# Patient Record
Sex: Female | Born: 1950 | Race: White | Hispanic: No | Marital: Married | Smoking: Never smoker
Health system: Southern US, Community
[De-identification: ages and names within clinical notes are randomized; demographics above are authoritative.]

## PROBLEM LIST (undated history)

## (undated) DIAGNOSIS — K219 Gastro-esophageal reflux disease without esophagitis: Secondary | ICD-10-CM

## (undated) DIAGNOSIS — Z8774 Personal history of (corrected) congenital malformations of heart and circulatory system: Secondary | ICD-10-CM

## (undated) DIAGNOSIS — T7840XA Allergy, unspecified, initial encounter: Secondary | ICD-10-CM

## (undated) DIAGNOSIS — I5189 Other ill-defined heart diseases: Secondary | ICD-10-CM

## (undated) DIAGNOSIS — F32A Depression, unspecified: Secondary | ICD-10-CM

## (undated) DIAGNOSIS — I10 Essential (primary) hypertension: Secondary | ICD-10-CM

## (undated) DIAGNOSIS — G459 Transient cerebral ischemic attack, unspecified: Secondary | ICD-10-CM

## (undated) DIAGNOSIS — K760 Fatty (change of) liver, not elsewhere classified: Secondary | ICD-10-CM

## (undated) DIAGNOSIS — G43909 Migraine, unspecified, not intractable, without status migrainosus: Secondary | ICD-10-CM

## (undated) DIAGNOSIS — I700 Atherosclerosis of aorta: Secondary | ICD-10-CM

## (undated) DIAGNOSIS — Z860100 Personal history of colon polyps, unspecified: Secondary | ICD-10-CM

## (undated) DIAGNOSIS — F329 Major depressive disorder, single episode, unspecified: Secondary | ICD-10-CM

## (undated) DIAGNOSIS — Z8619 Personal history of other infectious and parasitic diseases: Secondary | ICD-10-CM

## (undated) DIAGNOSIS — I639 Cerebral infarction, unspecified: Secondary | ICD-10-CM

## (undated) HISTORY — DX: Fatty (change of) liver, not elsewhere classified: K76.0

## (undated) HISTORY — DX: Migraine, unspecified, not intractable, without status migrainosus: G43.909

## (undated) HISTORY — DX: Depression, unspecified: F32.A

## (undated) HISTORY — DX: Personal history of colon polyps, unspecified: Z86.0100

## (undated) HISTORY — DX: Atherosclerosis of aorta: I70.0

## (undated) HISTORY — DX: Personal history of other infectious and parasitic diseases: Z86.19

## (undated) HISTORY — DX: Personal history of colonic polyps: Z86.010

## (undated) HISTORY — DX: Personal history of (corrected) congenital malformations of heart and circulatory system: Z87.74

## (undated) HISTORY — DX: Other ill-defined heart diseases: I51.89

## (undated) HISTORY — DX: Allergy, unspecified, initial encounter: T78.40XA

## (undated) HISTORY — DX: Major depressive disorder, single episode, unspecified: F32.9

## (undated) HISTORY — DX: Gastro-esophageal reflux disease without esophagitis: K21.9

## (undated) HISTORY — DX: Cerebral infarction, unspecified: I63.9

---

## 2003-04-05 ENCOUNTER — Ambulatory Visit (HOSPITAL_COMMUNITY): Admission: RE | Admit: 2003-04-05 | Discharge: 2003-04-05 | Payer: Self-pay | Admitting: Internal Medicine

## 2003-04-29 ENCOUNTER — Encounter: Payer: Self-pay | Admitting: Gastroenterology

## 2003-04-29 ENCOUNTER — Encounter: Admission: RE | Admit: 2003-04-29 | Discharge: 2003-04-29 | Payer: Self-pay | Admitting: Gastroenterology

## 2004-01-09 ENCOUNTER — Ambulatory Visit (HOSPITAL_COMMUNITY): Admission: RE | Admit: 2004-01-09 | Discharge: 2004-01-09 | Payer: Self-pay | Admitting: Gastroenterology

## 2004-02-17 ENCOUNTER — Ambulatory Visit (HOSPITAL_COMMUNITY): Admission: RE | Admit: 2004-02-17 | Discharge: 2004-02-17 | Payer: Self-pay | Admitting: Internal Medicine

## 2005-06-19 ENCOUNTER — Ambulatory Visit: Payer: Self-pay | Admitting: Internal Medicine

## 2005-07-17 ENCOUNTER — Ambulatory Visit: Payer: Self-pay | Admitting: Internal Medicine

## 2005-08-14 ENCOUNTER — Ambulatory Visit: Payer: Self-pay | Admitting: Internal Medicine

## 2005-08-28 ENCOUNTER — Ambulatory Visit: Payer: Self-pay | Admitting: Internal Medicine

## 2005-09-30 ENCOUNTER — Ambulatory Visit: Payer: Self-pay | Admitting: Internal Medicine

## 2005-12-05 ENCOUNTER — Ambulatory Visit: Payer: Self-pay | Admitting: Internal Medicine

## 2005-12-18 ENCOUNTER — Ambulatory Visit: Payer: Self-pay | Admitting: Internal Medicine

## 2006-01-17 ENCOUNTER — Ambulatory Visit: Payer: Self-pay | Admitting: Internal Medicine

## 2006-01-20 ENCOUNTER — Ambulatory Visit: Payer: Self-pay | Admitting: Internal Medicine

## 2006-02-14 ENCOUNTER — Ambulatory Visit: Payer: Self-pay | Admitting: Internal Medicine

## 2006-02-18 ENCOUNTER — Ambulatory Visit: Payer: Self-pay | Admitting: Internal Medicine

## 2006-02-21 ENCOUNTER — Ambulatory Visit: Payer: Self-pay | Admitting: Internal Medicine

## 2006-02-25 ENCOUNTER — Ambulatory Visit: Payer: Self-pay | Admitting: Internal Medicine

## 2006-02-28 ENCOUNTER — Ambulatory Visit: Payer: Self-pay | Admitting: Internal Medicine

## 2006-03-04 ENCOUNTER — Ambulatory Visit: Payer: Self-pay | Admitting: Internal Medicine

## 2006-03-07 ENCOUNTER — Ambulatory Visit: Payer: Self-pay | Admitting: Internal Medicine

## 2006-03-10 ENCOUNTER — Ambulatory Visit: Payer: Self-pay | Admitting: Internal Medicine

## 2006-03-11 ENCOUNTER — Ambulatory Visit: Payer: Self-pay | Admitting: Internal Medicine

## 2006-03-21 ENCOUNTER — Ambulatory Visit: Payer: Self-pay | Admitting: Internal Medicine

## 2006-03-25 ENCOUNTER — Ambulatory Visit: Payer: Self-pay | Admitting: Internal Medicine

## 2006-03-28 ENCOUNTER — Ambulatory Visit: Payer: Self-pay | Admitting: Internal Medicine

## 2006-04-01 ENCOUNTER — Ambulatory Visit: Payer: Self-pay | Admitting: Internal Medicine

## 2006-04-08 ENCOUNTER — Ambulatory Visit: Payer: Self-pay | Admitting: Internal Medicine

## 2006-04-11 ENCOUNTER — Ambulatory Visit: Payer: Self-pay | Admitting: Internal Medicine

## 2006-04-14 ENCOUNTER — Ambulatory Visit: Payer: Self-pay | Admitting: Internal Medicine

## 2006-04-16 ENCOUNTER — Ambulatory Visit: Payer: Self-pay | Admitting: Internal Medicine

## 2006-04-18 ENCOUNTER — Ambulatory Visit: Payer: Self-pay | Admitting: Internal Medicine

## 2006-04-22 ENCOUNTER — Ambulatory Visit: Payer: Self-pay | Admitting: Internal Medicine

## 2006-04-25 ENCOUNTER — Ambulatory Visit: Payer: Self-pay | Admitting: Internal Medicine

## 2006-04-29 ENCOUNTER — Ambulatory Visit: Payer: Self-pay | Admitting: Internal Medicine

## 2006-05-02 ENCOUNTER — Ambulatory Visit: Payer: Self-pay | Admitting: Internal Medicine

## 2006-05-06 ENCOUNTER — Ambulatory Visit: Payer: Self-pay | Admitting: Internal Medicine

## 2006-05-09 ENCOUNTER — Ambulatory Visit: Payer: Self-pay | Admitting: Internal Medicine

## 2006-05-13 ENCOUNTER — Ambulatory Visit: Payer: Self-pay | Admitting: Internal Medicine

## 2006-05-16 ENCOUNTER — Ambulatory Visit: Payer: Self-pay | Admitting: Internal Medicine

## 2006-05-19 ENCOUNTER — Ambulatory Visit: Payer: Self-pay | Admitting: Internal Medicine

## 2006-05-20 ENCOUNTER — Ambulatory Visit: Payer: Self-pay | Admitting: Internal Medicine

## 2006-05-23 ENCOUNTER — Ambulatory Visit: Payer: Self-pay | Admitting: Internal Medicine

## 2006-06-03 ENCOUNTER — Ambulatory Visit: Payer: Self-pay | Admitting: Internal Medicine

## 2006-06-06 ENCOUNTER — Ambulatory Visit: Payer: Self-pay | Admitting: Internal Medicine

## 2006-06-10 ENCOUNTER — Ambulatory Visit: Payer: Self-pay | Admitting: Internal Medicine

## 2006-06-17 ENCOUNTER — Ambulatory Visit: Payer: Self-pay | Admitting: Internal Medicine

## 2006-06-27 ENCOUNTER — Ambulatory Visit: Payer: Self-pay | Admitting: Internal Medicine

## 2006-07-01 ENCOUNTER — Ambulatory Visit: Payer: Self-pay | Admitting: Internal Medicine

## 2006-07-09 ENCOUNTER — Ambulatory Visit: Payer: Self-pay | Admitting: Internal Medicine

## 2006-07-15 ENCOUNTER — Ambulatory Visit: Payer: Self-pay | Admitting: Internal Medicine

## 2006-07-25 ENCOUNTER — Ambulatory Visit: Payer: Self-pay | Admitting: Internal Medicine

## 2006-08-08 ENCOUNTER — Ambulatory Visit: Payer: Self-pay | Admitting: Internal Medicine

## 2006-08-22 ENCOUNTER — Ambulatory Visit: Payer: Self-pay | Admitting: Internal Medicine

## 2006-08-29 ENCOUNTER — Ambulatory Visit: Payer: Self-pay | Admitting: Internal Medicine

## 2006-09-01 ENCOUNTER — Ambulatory Visit: Payer: Self-pay | Admitting: Internal Medicine

## 2006-09-12 ENCOUNTER — Ambulatory Visit: Payer: Self-pay | Admitting: Internal Medicine

## 2006-09-19 ENCOUNTER — Ambulatory Visit: Payer: Self-pay | Admitting: Internal Medicine

## 2006-10-03 ENCOUNTER — Ambulatory Visit: Payer: Self-pay | Admitting: Internal Medicine

## 2006-10-09 ENCOUNTER — Ambulatory Visit: Payer: Self-pay | Admitting: Internal Medicine

## 2006-10-14 ENCOUNTER — Ambulatory Visit: Payer: Self-pay | Admitting: Internal Medicine

## 2006-10-17 ENCOUNTER — Ambulatory Visit: Payer: Self-pay | Admitting: Internal Medicine

## 2006-10-24 ENCOUNTER — Ambulatory Visit: Payer: Self-pay | Admitting: Internal Medicine

## 2006-10-31 ENCOUNTER — Ambulatory Visit: Payer: Self-pay | Admitting: Internal Medicine

## 2006-11-06 ENCOUNTER — Ambulatory Visit: Payer: Self-pay | Admitting: Internal Medicine

## 2006-11-11 ENCOUNTER — Ambulatory Visit: Payer: Self-pay | Admitting: Internal Medicine

## 2006-11-21 ENCOUNTER — Ambulatory Visit: Payer: Self-pay | Admitting: Internal Medicine

## 2006-12-05 ENCOUNTER — Ambulatory Visit: Payer: Self-pay | Admitting: Internal Medicine

## 2006-12-12 ENCOUNTER — Ambulatory Visit: Payer: Self-pay | Admitting: Internal Medicine

## 2006-12-19 ENCOUNTER — Ambulatory Visit: Payer: Self-pay | Admitting: Internal Medicine

## 2007-01-02 ENCOUNTER — Ambulatory Visit: Payer: Self-pay | Admitting: Internal Medicine

## 2007-01-14 ENCOUNTER — Ambulatory Visit: Payer: Self-pay | Admitting: Internal Medicine

## 2007-01-20 ENCOUNTER — Ambulatory Visit: Payer: Self-pay | Admitting: Internal Medicine

## 2007-02-04 ENCOUNTER — Ambulatory Visit: Payer: Self-pay | Admitting: Internal Medicine

## 2007-02-10 ENCOUNTER — Ambulatory Visit: Payer: Self-pay | Admitting: Internal Medicine

## 2007-02-25 ENCOUNTER — Ambulatory Visit: Payer: Self-pay | Admitting: Internal Medicine

## 2007-03-06 ENCOUNTER — Ambulatory Visit: Payer: Self-pay | Admitting: Internal Medicine

## 2007-03-10 ENCOUNTER — Ambulatory Visit: Payer: Self-pay | Admitting: Internal Medicine

## 2007-03-16 ENCOUNTER — Ambulatory Visit: Payer: Self-pay | Admitting: Internal Medicine

## 2007-03-31 ENCOUNTER — Ambulatory Visit: Payer: Self-pay | Admitting: Internal Medicine

## 2007-04-01 ENCOUNTER — Ambulatory Visit: Payer: Self-pay | Admitting: Internal Medicine

## 2007-04-10 ENCOUNTER — Ambulatory Visit: Payer: Self-pay | Admitting: Internal Medicine

## 2007-04-17 ENCOUNTER — Ambulatory Visit: Payer: Self-pay | Admitting: Internal Medicine

## 2007-05-01 ENCOUNTER — Ambulatory Visit: Payer: Self-pay | Admitting: Internal Medicine

## 2007-05-14 ENCOUNTER — Ambulatory Visit: Payer: Self-pay | Admitting: Internal Medicine

## 2007-05-22 ENCOUNTER — Ambulatory Visit: Payer: Self-pay | Admitting: Internal Medicine

## 2007-05-27 ENCOUNTER — Ambulatory Visit: Payer: Self-pay | Admitting: Internal Medicine

## 2007-06-12 ENCOUNTER — Ambulatory Visit: Payer: Self-pay | Admitting: Internal Medicine

## 2007-06-26 ENCOUNTER — Ambulatory Visit: Payer: Self-pay | Admitting: Internal Medicine

## 2007-07-03 ENCOUNTER — Ambulatory Visit: Payer: Self-pay | Admitting: Internal Medicine

## 2007-07-14 ENCOUNTER — Ambulatory Visit: Payer: Self-pay | Admitting: Internal Medicine

## 2007-07-24 ENCOUNTER — Ambulatory Visit: Payer: Self-pay | Admitting: Internal Medicine

## 2007-07-28 ENCOUNTER — Ambulatory Visit: Payer: Self-pay | Admitting: Internal Medicine

## 2007-08-21 ENCOUNTER — Ambulatory Visit: Payer: Self-pay | Admitting: Internal Medicine

## 2007-08-27 ENCOUNTER — Ambulatory Visit: Payer: Self-pay | Admitting: Internal Medicine

## 2007-09-11 ENCOUNTER — Ambulatory Visit: Payer: Self-pay | Admitting: Internal Medicine

## 2007-09-18 ENCOUNTER — Ambulatory Visit: Payer: Self-pay | Admitting: Internal Medicine

## 2007-09-21 DIAGNOSIS — J309 Allergic rhinitis, unspecified: Secondary | ICD-10-CM | POA: Insufficient documentation

## 2007-09-21 DIAGNOSIS — R05 Cough: Secondary | ICD-10-CM

## 2007-09-21 DIAGNOSIS — K219 Gastro-esophageal reflux disease without esophagitis: Secondary | ICD-10-CM | POA: Insufficient documentation

## 2007-09-21 DIAGNOSIS — R059 Cough, unspecified: Secondary | ICD-10-CM | POA: Insufficient documentation

## 2007-09-24 ENCOUNTER — Ambulatory Visit: Payer: Self-pay | Admitting: Internal Medicine

## 2007-09-30 ENCOUNTER — Ambulatory Visit: Payer: Self-pay | Admitting: Internal Medicine

## 2007-10-02 ENCOUNTER — Ambulatory Visit: Payer: Self-pay | Admitting: Internal Medicine

## 2007-10-09 ENCOUNTER — Ambulatory Visit: Payer: Self-pay | Admitting: Internal Medicine

## 2007-10-16 ENCOUNTER — Ambulatory Visit: Payer: Self-pay | Admitting: Internal Medicine

## 2007-10-23 ENCOUNTER — Ambulatory Visit: Payer: Self-pay | Admitting: Internal Medicine

## 2007-10-30 ENCOUNTER — Ambulatory Visit: Payer: Self-pay | Admitting: Internal Medicine

## 2007-11-06 ENCOUNTER — Ambulatory Visit: Payer: Self-pay | Admitting: Internal Medicine

## 2007-11-20 ENCOUNTER — Ambulatory Visit: Payer: Self-pay | Admitting: Internal Medicine

## 2007-12-04 ENCOUNTER — Ambulatory Visit: Payer: Self-pay | Admitting: Internal Medicine

## 2007-12-10 ENCOUNTER — Ambulatory Visit: Payer: Self-pay | Admitting: Internal Medicine

## 2010-09-18 NOTE — Miscellaneous (Signed)
Summary: Injection Record / [facility]    Injection Record / [facility]    Imported By: Lennie Odor 01/09/2010 15:52:09  _____________________________________________________________________  External Attachment:    Type:   Image     Comment:   External Document

## 2011-01-01 NOTE — Assessment & Plan Note (Signed)
[facility]                             PULMONARY OFFICE NOTE   Yolanda Gibbs, Yolanda Gibbs                       MRN:          [id number]  DATE:05/14/2007                            DOB:          [birth year]    PROBLEM LIST:  1. Allergic rhinitis.  2. Cough.  3. Esophageal reflux.   HISTORY:  Still notices postnasal drip and cough.  This week has had a  sense of bilateral maxillary pressure but not blowing anything out, some  postnasal drip sensation still in her throat and occasionally will bring  up some scant clear mucus.  She is not actively aware of heartburn.  She  brings a chest x-ray from December 17, 2006, done at [place].  On  initial review on my computer screen, I do not see active disease.   MEDICATIONS:  She continues allergy vaccine at 1:10 but has stopped  everything else.   DRUG INTOLERANT:  DARVON.   OBJECTIVE:  VITAL SIGNS:  Weight 150 pounds, BP 124/68, pulse 97, room  air saturation 98%.  HEENT:  There is periorbital edema.  Conjunctivae are not injected.  The  pharynx was not red.  I saw no evidence of postnasal drip.  RESPIRATORY:  She started to cough and was putting a peppermint in her  mouth.  I stopped her and pointed out that peppermint relaxes the lower  esophageal sphincter and tends to aggravate reflux which is a potential  part of her coughing.  I heard no wheeze or rhonchi.   IMPRESSION:  Multifactorial cough with components of allergic rhinitis  and probably reflux.   PLAN:  Continue vaccine, use over-the-counter Claritin, and either  Pepcid or Prilosec supplemented with Tums, may use decongestant Sudafed  PE p.r.n.  Saline lavage.  Schedule return 6 months, earlier p.r.n.  failure to clear.     Clinton D. Maple Hudson, MD, [facility], FACP  Electronically Signed    CDY/MedQ  DD: 05/17/2007  DT: 05/17/2007  Job #: [id number]   cc:   [facility]

## 2011-01-04 NOTE — Assessment & Plan Note (Signed)
[facility]                               PULMONARY OFFICE NOTE   Yolanda Gibbs, Yolanda Gibbs                       MRN:          [id number]  DATE:04/14/2006                            DOB:          12/31/1950    PULMONARY ALLERGY FOLLOWUP:   PROBLEM:  1. Allergic rhinitis.  2. Cough.   HISTORY:  She has begun allergy vaccine getting injections here and building  with no problems.  Astelin made her too sleepy the next day.  She has noted  occasional reflux since she has been coached to watch for it.   MEDICATIONS:  1. Effexor 75 mg x3.  2. Aspirin.  3. Estradiol.  4. Prometrium.  5. Allergy vaccine still in buildup phase.   ALLERGIES:  Drug intolerance to DARVON.   OBJECTIVE:  VITAL SIGNS:  Weight 152 pounds.  Blood pressure 122/68. Pulse  regular 75.  Room air saturation 97%.  HEENT:  Speech quality seems mildly nasal and there is a little periorbital  puffiness.  I see no post nasal drainage or polyps and her pharynx is clear.  There is no adenopathy.  CHEST:  Clear with good air-flow, no cough or wheeze.  HEART:  Heart sounds are regular without murmur.   IMPRESSION:  1. Allergic rhinitis still with room to improve.  Allergy vaccine buildup      is progressing well.  2. Suspect at least intermittent reflux as a partial component of her      cough.   PLAN:  1. I suggested she resume and maintain daily Prilosec over-the-counter.  2. Sample Singulair for trial 10 mg.  3. Sample Nasonex two sprays each nostril daily.  4. Claritin over-the-counter p.r.n.  5. Continue allergy vaccine buildup.  6. Schedule return to me in three months, earlier as needed.                                   Clinton D. Maple Hudson, MD, [facility], FACP   CDY/MedQ  DD:  04/16/2006  DT:  04/17/2006  Job #:  [id number]   cc:   Maryelizabeth Rowan, MD

## 2011-01-04 NOTE — Op Note (Signed)
NAMEMARNI, Yolanda Gibbs                          ACCOUNT NO.:  [id number]   MEDICAL RECORD NO.:  [id number]                   PATIENT TYPE:  AMB   LOCATION:  ENDO                                 FACILITY:  [facility]   PHYSICIAN:  Anselmo Rod, M.D.               DATE OF BIRTH:  1951/02/25   DATE OF PROCEDURE:  01/09/2004  DATE OF DISCHARGE:                                 OPERATIVE REPORT   PROCEDURE:  Screening colonoscopy.   ENDOSCOPIST:  Charna Elizabeth, M.D.   INSTRUMENT USED:  Olympus video colonoscope.   INDICATIONS FOR PROCEDURE:  60 year old white female undergoing screening  colonoscopy to rule out colonic polyps, masses, etc.   PREPROCEDURE PREPARATION:  Informed consent was procured from the patient.  The patient was fasted for eight hours prior to the procedure and prepped  with a bottle of magnesium citrate and a gallon of GoLYTELY the night prior  to the procedure.   PREPROCEDURE PHYSICAL:  Patient with stable vital signs.  Neck supple.  Chest clear to auscultation.  S1 and S2 regular.  Abdomen soft with normal  bowel sounds.   DESCRIPTION OF PROCEDURE:  The patient was placed in the left lateral  decubitus position, sedated with 80 mg of Demerol and 8 mg Versed in slow  incremental doses.  Once the patient was adequately sedated, maintained on  low flow oxygen and continuous cardiac monitoring, the Olympus video  colonoscope was advanced into the rectum to the cecum.  The appendiceal  orifice and ileocecal valve were clearly visualized and photographed.  The  patient's position had to be changed from the left lateral to the supine  position and gentle application of abdominal pressure in the left lower  quadrant to reach the cecal base.  No masses, polyps, erosions, ulcerations,  or diverticula were seen.  Retroflexion of the rectum revealed no  abnormalities.  The patient tolerated the procedure well without  complications.   IMPRESSION:  Normal colonoscopy to the  cecum, no masses, polyps, or  diverticulum seen.   RECOMMENDATIONS:  1. Continue a high fiber diet with liberal fluid intake.  2. Repeat CRC screening is in the next ten years unless the patient develops     any abnormal symptoms in the interim.  3. Outpatient follow up as need arises in the future.                                               Anselmo Rod, M.D.    JNM/MEDQ  D:  01/09/2004  T:  01/09/2004  Job:  [id number]   cc:   Teena Irani. Arlyce Dice, M.D.  [address]  [place]  [place] [address]  Fax: [phone]

## 2011-01-04 NOTE — Assessment & Plan Note (Signed)
[facility]                             PULMONARY OFFICE NOTE   LILLEIGH, HECHAVARRIA                       MRN:          [id number]  DATE:11/11/2006                            DOB:          12-02-1950    PULMONARY OFFICE FOLLOWUP   PROBLEMS:  1. Allergic rhinitis.  2. Cough.   HISTORY:  Recent sore throat with increased nasal mucus, scant sputum  coughed up from chest.  No definite wheeze.  She is using Pepcid p.r.n.  without much heartburn.  No problems with her allergy vaccine, which is  given here at 1:50.   MEDICATIONS:  1. Effexor 75 mg x3.  2. Estradiol.  3. Prometrium.  4. Allergy vaccine.  5. Pepcid.   DRUG INTOLERANCES:  DARVON.   She has come off of Nasacort AQ and Singulair.   OBJECTIVE:  Weight 149 pounds, BP 124/70, pulse regular 85, room air  saturation 98%.  Nasal mucosa looks reddened without excess mucus or polyps.  Pharynx is  clear.  Conjunctivae clear.  LUNGS:  Clear to P and A.  Heart sounds normal.   IMPRESSION:  Rhinitis.  She needs to be paying attention to possible  role of reflux with some of her cough and it may help to be more  consistent with the Pepcid.   PLAN:  We are advancing vaccine to 1:10 and I have again discussed  environmental precautions.  She will try over-the-counter  antihistamines, if those are helpful.  Schedule return in 6 months,  earlier p.r.n.     Clinton D. Maple Hudson, MD, [facility], FACP  Electronically Signed   CDY/MedQ  DD: 11/15/2006  DT: 11/15/2006  Job #: [id number]   cc:   [facility]

## 2011-01-04 NOTE — Assessment & Plan Note (Signed)
Yolanda Gibbs                             PULMONARY OFFICE NOTE   Yolanda Gibbs, Yolanda Gibbs                       MRN:          [id number]  DATE:07/15/2006                            DOB:          [birth year]    PROBLEM:  1. Allergic rhinitis.  2. Cough.   HISTORY:  She returns for allergy followup getting her vaccine at 1:50  here.  She denies problems.  She has noticed increased cough especially  in the morning, producing small amounts of clear sputum and sometimes  she feels a little wheezy.  There is a constant postnasal drainage.  She  does notice reflux and takes Pepcid p.r.n.  She gets a hard feeling in  her throat which is her prompt to take the Pepcid.  She had not noticed  benefit from Nasacort or Singulair.   MEDICATION:  1. Effexor 75 mg.  2. Aspirin.  3. Estradiol.  4. Prometrium  5. Allergy vaccine.  6. Pepcid.   DRUG INTOLERANT OF DARVON.   OBJECTIVE:  Weight 151 pounds.  BP 122/70.  Pulse regular 83.  Room air  saturation 97%.  Question trace wheeze.  Her throat looks normal.  There is some sniffling but no drainage.  No  significant edema in the turbinates.  She has some dry cough.  Heart sounds are regular, normal.   IMPRESSION:  1. Allergic rhinitis.  2. Mild asthma.  3. Reflux.   PLAN:  Reflux precautions were emphasized.  Environmental precautions  were reemphasized.  Try samples of Xyzal 5 mg and then for a cheaper  comparison try generic Allegra 180 mg daily,  as well as antihistamines while continuing her vaccine here.  Schedule  return in 4 months, anticipating we will advance then to 1:10.  Early  p.r.n.     Clinton D. Maple Hudson, MD, [facility], FACP  Electronically Signed    CDY/MedQ  DD: 07/19/2006  DT: 07/21/2006  Job #: [id number]   cc:   Maryelizabeth Rowan, M.D.

## 2014-08-29 ENCOUNTER — Encounter: Payer: Self-pay | Admitting: Family

## 2014-08-29 ENCOUNTER — Ambulatory Visit (INDEPENDENT_AMBULATORY_CARE_PROVIDER_SITE_OTHER): Payer: 59 | Admitting: Family

## 2014-08-29 ENCOUNTER — Other Ambulatory Visit (HOSPITAL_COMMUNITY)
Admission: RE | Admit: 2014-08-29 | Discharge: 2014-08-29 | Disposition: A | Discharge status: Home / Self Care | Payer: 59 | Source: Ambulatory Visit | Attending: Family | Admitting: Family

## 2014-08-29 VITALS — BP 120/86 | HR 81 | Temp 98.4°F | Resp 16 | Ht 65.0 in | Wt 169.0 lb

## 2014-08-29 DIAGNOSIS — F32A Depression, unspecified: Secondary | ICD-10-CM | POA: Insufficient documentation

## 2014-08-29 DIAGNOSIS — N760 Acute vaginitis: Secondary | ICD-10-CM

## 2014-08-29 DIAGNOSIS — R05 Cough: Secondary | ICD-10-CM

## 2014-08-29 DIAGNOSIS — N3001 Acute cystitis with hematuria: Secondary | ICD-10-CM

## 2014-08-29 DIAGNOSIS — F329 Major depressive disorder, single episode, unspecified: Secondary | ICD-10-CM

## 2014-08-29 DIAGNOSIS — N390 Urinary tract infection, site not specified: Secondary | ICD-10-CM | POA: Insufficient documentation

## 2014-08-29 DIAGNOSIS — R059 Cough, unspecified: Secondary | ICD-10-CM

## 2014-08-29 DIAGNOSIS — Z86010 Personal history of colonic polyps: Secondary | ICD-10-CM

## 2014-08-29 LAB — POCT URINALYSIS DIPSTICK
Bilirubin, UA: NEGATIVE
GLUCOSE UA: NEGATIVE
KETONES UA: NEGATIVE
Nitrite, UA: NEGATIVE
SPEC GRAV UA: 1.015
UROBILINOGEN UA: NEGATIVE
pH, UA: 6

## 2014-08-29 MED ORDER — CIPROFLOXACIN HCL 250 MG PO TABS: 250.0000 mg | ORAL_TABLET | Freq: Two times a day (BID) | ORAL | Status: DC

## 2014-08-29 NOTE — Assessment & Plan Note (Signed)
She will be due for follow up colo at age 64.

## 2014-08-29 NOTE — Progress Notes (Signed)
Pre visit review using our clinic review tool, if applicable. No additional management support is needed unless otherwise documented below in the visit note. 

## 2014-08-29 NOTE — Assessment & Plan Note (Signed)
Fair control- long standing problem. She has seen Dr. Fannie Kneelint Young remotely. She has also seen ENT. Notes that symptoms are improved with gabapentin. Continue gabapentin and zantac.

## 2014-08-29 NOTE — Assessment & Plan Note (Signed)
UA + leuks, + blood. I think her symptoms are secondary to UTI. Will send for culture and will rx with cipro.

## 2014-08-29 NOTE — Assessment & Plan Note (Signed)
Wet prep is performed today. Await results.

## 2014-08-29 NOTE — Patient Instructions (Signed)
Please schedule a complete physical at the front desk. We will contact you with the results of your test. Welcome to [facility]!

## 2014-08-29 NOTE — Progress Notes (Signed)
Subjective:    Patient ID: Yolanda Gibbs, female    DOB: [birth year], 64 y.o.   MRN: [id number]  HPI  Yolanda Gibbs is a 64 yr old female who presents today to establish care. [facility].  Her chief complaint today is Vaginal irritation x 3-4 days and vaginal bleeding x 2 days. Tried monistat x 1 day without relief. Reports that she would see blood with urination only. Did have a spot on her panties this AM.  She reports + pain with urination.     Chronic cough- uses zantac for reflux. Reports that she has taken x 2 years.  Reports that she has a "really bad cough" and the allergist placed her on zantac which helped some. She reports gabapentin has helped the most for her cough which was given by her ENT- She sees Dr. Jenne PaneBates but only saw him once.  Depression- denies hx of anxiety but has hx of depression.  Initially started on effexor for menopausal symptoms around age 64. She notes that depression has worsened some. Denies SI/HI.  Reports that she sleeps well. She doesnot wish to adjust meds at this time.    Reports that she recently had shingles outbreak on trunk which has resolved.  Colon polyps- reports that her last colo was 2 years ago and was told that she should repeat at age 64.     Review of Systems  Constitutional: Negative for unexpected weight change.  HENT: Positive for rhinorrhea and tinnitus. Negative for hearing loss.   Eyes: Negative for visual disturbance.  Respiratory: Negative for cough.   Cardiovascular: Negative for leg swelling.  Gastrointestinal: Positive for constipation. Negative for nausea and diarrhea.  Musculoskeletal: Negative for myalgias and arthralgias.  Skin: Negative for rash.  Neurological: Negative for headaches.  Hematological: Negative for adenopathy.   Past Medical History  Diagnosis Date  . History of chicken pox   . Depression   . GERD (gastroesophageal reflux disease)   . Allergy    seasonal  . History of colon polyps     benign    History   Social History  . Marital Status: Married    Spouse Name: N/A    Number of Children: N/A  . Years of Education: N/A   Occupational History  . Not on file.   Social History Main Topics  . Smoking status: Never Smoker   . Smokeless tobacco: Never Used  . Alcohol Use: 1.8 - 2.4 oz/week    3-4 Not specified per week  . Drug Use: Not on file  . Sexual Activity: Not on file   Other Topics Concern  . Not on file   Social History Narrative    History reviewed. No pertinent past surgical history.  Family History  Problem Relation Age of Onset  . Arthritis Mother   . CAD Mother   . AAA (abdominal aortic aneurysm) Mother   . Cancer Mother     lung  . Stroke Father   . Cancer Maternal Aunt     breast  . Arthritis Maternal Grandmother   . Cancer Maternal Grandfather     prostate  . Depression Paternal Grandmother     Allergies  Allergen Reactions  . Propoxyphene Hcl     No current outpatient prescriptions on file prior to visit.   No current facility-administered medications on file prior to visit.    BP 120/86 mmHg  Pulse 81  Temp(Src) 98.4 F (36.9 C) (  Oral)  Resp 16  Ht  (1.651 m)  Wt 169 lb (76.658 kg)  BMI 28.12 kg/m2  SpO2 99%  LMP 08/19/2004       Objective:   Physical Exam  Constitutional: She is oriented to person, place, and time. She appears well-developed and well-nourished. No distress.  HENT:  Head: Normocephalic and atraumatic.  Eyes: No scleral icterus.  Neck: Neck supple. No thyromegaly present.  Cardiovascular: Normal rate and regular rhythm.   No murmur heard. Pulmonary/Chest: Effort normal and breath sounds normal. No respiratory distress. She has no wheezes. She has no rales. She exhibits no tenderness.  Genitourinary: Vagina normal. No erythema in the vagina. No vaginal discharge found.  Musculoskeletal: She exhibits no edema.  Lymphadenopathy:    She has no  cervical adenopathy.  Neurological: She is alert and oriented to person, place, and time.  Skin: Skin is warm and dry. No rash noted.  Psychiatric: She has a normal mood and affect. Her behavior is normal. Judgment and thought content normal.          Assessment & Plan:

## 2014-08-29 NOTE — Addendum Note (Signed)
Addended by: Eustace QuailEABOLD, Arliss Hepburn J on: 08/29/2014 05:21 PM   Modules accepted: Orders

## 2014-08-29 NOTE — Addendum Note (Signed)
Addended by: Mervin KungFERGERSON, Nathaniel Yaden A on: 08/29/2014 04:16 PM   Modules accepted: Orders

## 2014-08-30 LAB — CERVICOVAGINAL ANCILLARY ONLY
WET PREP (BD AFFIRM): NEGATIVE
WET PREP (BD AFFIRM): NEGATIVE
Wet Prep (BD Affirm): NEGATIVE

## 2014-08-31 ENCOUNTER — Telehealth: Payer: Self-pay | Admitting: Family

## 2014-08-31 LAB — URINE CULTURE

## 2014-09-07 NOTE — Telephone Encounter (Signed)
Opened in error

## 2014-10-24 ENCOUNTER — Telehealth: Payer: Self-pay | Admitting: *Deleted

## 2014-10-24 ENCOUNTER — Encounter: Payer: Self-pay | Admitting: *Deleted

## 2014-10-24 NOTE — Telephone Encounter (Signed)
Pre-Visit Call completed with patient and chart updated.   Pre-Visit Info documented in Specialty Comments under [employer].    

## 2014-10-26 ENCOUNTER — Telehealth: Payer: Self-pay | Admitting: Family

## 2014-10-26 ENCOUNTER — Encounter: Payer: Self-pay | Admitting: Family

## 2014-10-26 ENCOUNTER — Ambulatory Visit (INDEPENDENT_AMBULATORY_CARE_PROVIDER_SITE_OTHER): Payer: 59 | Admitting: Family

## 2014-10-26 VITALS — BP 110/70 | HR 80 | Temp 98.2°F | Resp 16 | Ht 65.0 in | Wt 168.0 lb

## 2014-10-26 DIAGNOSIS — Z0000 Encounter for general adult medical examination without abnormal findings: Secondary | ICD-10-CM

## 2014-10-26 LAB — BASIC METABOLIC PANEL
BUN: 11 mg/dL (ref 6–23)
CHLORIDE: 104 meq/L (ref 96–112)
CO2: 30 meq/L (ref 19–32)
Calcium: 9.7 mg/dL (ref 8.4–10.5)
Creatinine, Ser: 0.63 mg/dL (ref 0.40–1.20)
GFR: 101.87 mL/min (ref >60.00)
Glucose, Bld: 96 mg/dL (ref 70–99)
POTASSIUM: 4.1 meq/L (ref 3.5–5.1)
Sodium: 138 mEq/L (ref 135–145)

## 2014-10-26 LAB — CBC WITH DIFFERENTIAL/PLATELET
BASOS PCT: 2 % (ref 0.0–3.0)
Basophils Absolute: 0.1 10*3/uL (ref 0.0–0.1)
EOS ABS: 0.2 10*3/uL (ref 0.0–0.7)
Eosinophils Relative: 4.2 % (ref 0.0–5.0)
HEMATOCRIT: 40.2 % (ref 36.0–46.0)
HEMOGLOBIN: 13.5 g/dL (ref 12.0–15.0)
Lymphocytes Relative: 32.7 % (ref 12.0–46.0)
Lymphs Abs: 1.6 10*3/uL (ref 0.7–4.0)
MCHC: 33.6 g/dL (ref 30.0–36.0)
MCV: 87.3 fl (ref 78.0–100.0)
Monocytes Absolute: 0.4 10*3/uL (ref 0.1–1.0)
Monocytes Relative: 7.3 % (ref 3.0–12.0)
NEUTROS ABS: 2.7 10*3/uL (ref 1.4–7.7)
NEUTROS PCT: 53.8 % (ref 43.0–77.0)
Platelets: 328 10*3/uL (ref 150.0–400.0)
RBC: 4.61 Mil/uL (ref 3.87–5.11)
RDW: 14.1 % (ref 11.5–15.5)
WBC: 5 10*3/uL (ref 4.0–10.5)

## 2014-10-26 LAB — TSH: TSH: 1.22 u[IU]/mL (ref 0.35–4.50)

## 2014-10-26 LAB — HEPATIC FUNCTION PANEL
ALBUMIN: 4.3 g/dL (ref 3.5–5.2)
ALT: 18 U/L (ref 0–35)
AST: 21 U/L (ref 0–37)
Alkaline Phosphatase: 105 U/L (ref 39–117)
BILIRUBIN DIRECT: 0.1 mg/dL (ref 0.0–0.3)
TOTAL PROTEIN: 7 g/dL (ref 6.0–8.3)
Total Bilirubin: 0.3 mg/dL (ref 0.2–1.2)

## 2014-10-26 LAB — URINALYSIS, ROUTINE W REFLEX MICROSCOPIC
Bilirubin Urine: NEGATIVE
Hgb urine dipstick: NEGATIVE
KETONES UR: NEGATIVE
Leukocytes, UA: NEGATIVE
Nitrite: NEGATIVE
PH: 8 (ref 5.0–8.0)
RBC / HPF: NONE SEEN (ref 0–?)
SPECIFIC GRAVITY, URINE: 1.015 (ref 1.000–1.030)
TOTAL PROTEIN, URINE-UPE24: NEGATIVE
URINE GLUCOSE: NEGATIVE
Urobilinogen, UA: 0.2 (ref 0.0–1.0)

## 2014-10-26 LAB — LIPID PANEL
Cholesterol: 243 mg/dL — ABNORMAL HIGH (ref 0–200)
HDL: 74.5 mg/dL (ref >39.00)
LDL CALC: 145 mg/dL — AB (ref 0–99)
NonHDL: 168.5
TRIGLYCERIDES: 116 mg/dL (ref 0.0–149.0)
Total CHOL/HDL Ratio: 3
VLDL: 23.2 mg/dL (ref 0.0–40.0)

## 2014-10-26 MED ORDER — VENLAFAXINE HCL ER 75 MG PO CP24: 75.0000 mg | ORAL_CAPSULE | Freq: Every day | ORAL | Status: DC

## 2014-10-26 NOTE — Telephone Encounter (Signed)
Can only do referral to on specific MD unable to go to group, lm w/ family member to call back

## 2014-10-26 NOTE — Telephone Encounter (Signed)
[employer] auth # [id number]

## 2014-10-26 NOTE — Telephone Encounter (Signed)
Wants [facility] instead of specific doctor listed below.

## 2014-10-26 NOTE — Telephone Encounter (Signed)
Informed patient of this and she states that she will call Dr. Hazle Quantigby to see if she can get in with him and will call us back

## 2014-10-26 NOTE — Telephone Encounter (Signed)
Patient states that she is now seeing Dr. Hazle Quantigby on 11/16/14.  She did say that Efraim KaufmannMelissa is now listed on her insurance card

## 2014-10-26 NOTE — Assessment & Plan Note (Signed)
Advised pt to schedule mammogram, we will schedule dexa, follow up as scheduled with GYN. Add 30 minutes of walking 5x a week. Obtain routine lab work, check with insurance re: coverage for zostavax and schedule nurse visit.

## 2014-10-26 NOTE — Telephone Encounter (Signed)
Caller name: Eunice BlaseDebbie Relation to pt: self Call back number:  [phone] Pharmacy:  Reason for call:   [facility]eds [facility] for [facility] eye [facility]. Seeing Dr. Allena KatzPatel on 11/16/14. [facility]

## 2014-10-26 NOTE — Telephone Encounter (Signed)
Not listed as PCP with insurance, unable to do referral until pt has it switched. Pt aware

## 2014-10-26 NOTE — Progress Notes (Signed)
Subjective:    Patient ID: Yolanda Gibbs, female    DOB: [birth year], 64 y.o.   MRN: [id number]  HPI  Patient presents today for complete physical.  Immunizations:  Due for shingles, tetanus up to date Diet: could improve diet Exercise: not exercising regularly Colonoscopy: due 2020 Dexa: due Pap Smear: normal 3 yrs ago- will schedule follow up with GYN Mammogram: due, has at [facility]    Review of Systems  Constitutional: Negative for unexpected weight change.  HENT: Positive for rhinorrhea. Negative for hearing loss.        + tinitis, white noise is getting worse  Was told that she has nerve damage   Eyes: Positive for visual disturbance.       Floaters, gets her vision checked yearly  Respiratory: Positive for cough.   Cardiovascular: Negative for chest pain, palpitations and leg swelling.  Gastrointestinal: Positive for constipation. Negative for nausea, abdominal pain and diarrhea.       Stool softner/miralax/fiber supplement prn. Occasional gerd  Genitourinary: Negative for dysuria and frequency.  Musculoskeletal: Negative for myalgias and arthralgias.  Skin: Negative for rash.  Neurological: Negative for headaches.  Hematological: Negative for adenopathy.  Psychiatric/Behavioral: Negative for dysphoric mood and agitation.   Past Medical History  Diagnosis Date  . History of chicken pox   . Depression   . GERD (gastroesophageal reflux disease)   . Allergy     seasonal  . History of colon polyps     benign    History   Social History  . Marital Status: Married    Spouse Name: N/A  . Number of Children: N/A  . Years of Education: N/A   Occupational History  . Not on file.   Social History Main Topics  . Smoking status: Never Smoker   . Smokeless tobacco: Never Used  . Alcohol Use: 1.8 - 2.4 oz/week    3-4 Standard drinks or equivalent per week  . Drug Use: Not on file  . Sexual Activity: Not on file   Other Topics Concern  . 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   Completed college   Enjoys watching old movies, needlework, cleaning her house   No pets.     History reviewed. No pertinent past surgical history.  Family History  Problem Relation Age of Onset  . Arthritis Mother   . CAD Mother   . AAA (abdominal aortic aneurysm) Mother   . Cancer Mother     lung  . Stroke Father   . Cancer Maternal Aunt     breast  . Arthritis Maternal Grandmother   . Cancer Maternal Grandfather     prostate  . Depression Paternal Grandmother     Allergies  Allergen Reactions  . Duloxetine     [facility]- patient is unsure   . Propoxyphene Hcl     Current Outpatient Prescriptions on File Prior to Visit  Medication Sig Dispense Refill  . Calcium-Phosphorus-Vitamin D (CITRACAL +D3 PO) Take 1-2 tablets by mouth 2 (two) times daily. Vit d 500 IU, calcium 630mg  each    . cetirizine (ZYRTEC) 10 MG chewable tablet Chew 10 mg by mouth daily.    . Docusate Sodium  100 MG capsule Take 100 mg by mouth as needed.    . gabapentin (NEURONTIN) 300 MG capsule Take 300 mg by mouth 3 (three) times daily.  5  . Multiple Vitamin (MULTIVITAMIN) tablet Take 1 tablet by mouth daily. [employer] Women's Ultra Mega    . ranitidine (ZANTAC) 300 MG tablet Take 300 mg by mouth daily.  5   No current facility-administered medications on file prior to visit.    BP 110/70 mmHg  Pulse 80  Temp(Src) 98.2 F (36.8 C) (Oral)  Resp 16  Ht  (1.651 m)  Wt 168 lb (76.204 kg)  BMI 27.96 kg/m2  SpO2 99%  LMP 08/19/2004       Objective:   Physical Exam  Physical Exam  Constitutional: She is oriented to person, place, and time. She appears well-developed and well-nourished. No distress.  HENT:   Head: Normocephalic and atraumatic.  Right Ear: Tympanic membrane and ear canal normal.  Left Ear: Tympanic membrane and ear canal normal.  Mouth/Throat: Oropharynx is clear and moist.  Eyes: Pupils are equal, round, and reactive to light. No scleral icterus.  Neck: Normal range of motion. No thyromegaly present.  Cardiovascular: Normal rate and regular rhythm.   No murmur heard. Pulmonary/Chest: Effort normal and breath sounds normal. No respiratory distress. He has no wheezes. She has no rales. She exhibits no tenderness.  Abdominal: Soft. Bowel sounds are normal. He exhibits no distension and no mass. There is no tenderness. There is no rebound and no guarding.  Musculoskeletal: She exhibits no edema.  Lymphadenopathy:    She has no cervical adenopathy.  Neurological: She is alert and oriented to person, place, and time. She has normal reflexes. She exhibits normal muscle tone. Coordination normal.  Skin: Skin is warm and dry.  Psychiatric: She has a normal mood and affect. Her behavior is normal. Judgment and thought content normal.  Breasts: Examined lying Right: Without masses, retractions, discharge or axillary adenopathy.  Left: Without masses, retractions, discharge or axillary adenopathy.       Assessment & Plan:         Assessment & Plan:

## 2014-10-26 NOTE — Progress Notes (Signed)
Pre visit review using our clinic review tool, if applicable. No additional management support is needed unless otherwise documented below in the visit note. 

## 2014-10-27 ENCOUNTER — Telehealth: Payer: Self-pay | Admitting: Family

## 2014-10-27 ENCOUNTER — Encounter: Payer: Self-pay | Admitting: Family

## 2014-10-27 DIAGNOSIS — E785 Hyperlipidemia, unspecified: Secondary | ICD-10-CM

## 2014-10-27 NOTE — Addendum Note (Signed)
Addended by: Sandford Craze'SULLIVAN, Ellinore Merced on: 10/27/2014 10:26 AM   Modules accepted: Orders

## 2014-10-27 NOTE — Telephone Encounter (Signed)
See order.

## 2014-10-31 ENCOUNTER — Telehealth: Payer: Self-pay | Admitting: *Deleted

## 2014-10-31 NOTE — Telephone Encounter (Signed)
Medical records received via fax [facility]. Forwarded to [employer] Hath[employer]ricia/[name]. JG//CMA

## 2014-11-23 ENCOUNTER — Encounter: Payer: Self-pay | Admitting: Family

## 2014-11-25 ENCOUNTER — Inpatient Hospital Stay: Admission: RE | Admit: 2014-11-25 | Payer: 59 | Source: Ambulatory Visit

## 2015-01-17 ENCOUNTER — Telehealth: Payer: Self-pay | Admitting: Family

## 2015-01-17 NOTE — Telephone Encounter (Signed)
Caller name: Ananya Relation to pt: self Call back number: [phone] Pharmacy:  Reason for call:   Requesting effexor refill

## 2015-01-17 NOTE — Telephone Encounter (Signed)
Left message on pharmacy voicemail to check Rx from 10/26/14 and use remaining refills on file. Notified pt.

## 2015-01-27 ENCOUNTER — Ambulatory Visit: Payer: Self-pay | Admitting: Family Medicine

## 2015-02-02 ENCOUNTER — Encounter: Payer: Self-pay | Admitting: Family

## 2015-02-02 ENCOUNTER — Telehealth: Payer: Self-pay | Admitting: *Deleted

## 2015-02-02 NOTE — Telephone Encounter (Signed)
Received records from [facility] ([facility]) and forwarded to Provider for review.

## 2015-05-24 DIAGNOSIS — K219 Gastro-esophageal reflux disease without esophagitis: Secondary | ICD-10-CM

## 2015-05-24 DIAGNOSIS — R059 Cough, unspecified: Secondary | ICD-10-CM

## 2015-05-24 DIAGNOSIS — R05 Cough: Secondary | ICD-10-CM | POA: Insufficient documentation

## 2015-05-24 DIAGNOSIS — J309 Allergic rhinitis, unspecified: Secondary | ICD-10-CM | POA: Insufficient documentation

## 2015-06-26 ENCOUNTER — Encounter: Payer: Self-pay | Admitting: Internal Medicine

## 2015-07-28 ENCOUNTER — Encounter: Payer: Self-pay | Admitting: Internal Medicine

## 2015-08-16 ENCOUNTER — Other Ambulatory Visit: Payer: Self-pay | Admitting: Family

## 2015-08-17 ENCOUNTER — Other Ambulatory Visit: Payer: Self-pay | Admitting: Family

## 2015-09-06 ENCOUNTER — Other Ambulatory Visit: Payer: Self-pay | Admitting: Family

## 2015-10-31 ENCOUNTER — Telehealth: Payer: Self-pay | Admitting: *Deleted

## 2015-10-31 NOTE — Telephone Encounter (Signed)
Unable to reach patient at time of pre-visit call.  Invalid home phone #.

## 2015-11-01 ENCOUNTER — Ambulatory Visit (INDEPENDENT_AMBULATORY_CARE_PROVIDER_SITE_OTHER): Payer: BLUE CROSS/BLUE SHIELD | Admitting: Family

## 2015-11-01 ENCOUNTER — Encounter: Payer: Self-pay | Admitting: Family

## 2015-11-01 VITALS — BP 116/78 | HR 84 | Temp 98.3°F | Ht 65.0 in | Wt 165.2 lb

## 2015-11-01 DIAGNOSIS — F329 Major depressive disorder, single episode, unspecified: Secondary | ICD-10-CM | POA: Diagnosis not present

## 2015-11-01 DIAGNOSIS — E2839 Other primary ovarian failure: Secondary | ICD-10-CM

## 2015-11-01 DIAGNOSIS — Z0000 Encounter for general adult medical examination without abnormal findings: Secondary | ICD-10-CM

## 2015-11-01 DIAGNOSIS — F32A Depression, unspecified: Secondary | ICD-10-CM

## 2015-11-01 DIAGNOSIS — Z0001 Encounter for general adult medical examination with abnormal findings: Secondary | ICD-10-CM | POA: Diagnosis not present

## 2015-11-01 DIAGNOSIS — R6889 Other general symptoms and signs: Secondary | ICD-10-CM | POA: Diagnosis not present

## 2015-11-01 DIAGNOSIS — Z23 Encounter for immunization: Secondary | ICD-10-CM | POA: Diagnosis not present

## 2015-11-01 LAB — CBC WITH DIFFERENTIAL/PLATELET
BASOS ABS: 0.1 10*3/uL (ref 0.0–0.1)
Basophils Relative: 1.2 % (ref 0.0–3.0)
Eosinophils Absolute: 0.2 10*3/uL (ref 0.0–0.7)
Eosinophils Relative: 3.7 % (ref 0.0–5.0)
HEMATOCRIT: 41.5 % (ref 36.0–46.0)
Hemoglobin: 13.8 g/dL (ref 12.0–15.0)
LYMPHS PCT: 32.7 % (ref 12.0–46.0)
Lymphs Abs: 1.6 10*3/uL (ref 0.7–4.0)
MCHC: 33.2 g/dL (ref 30.0–36.0)
MCV: 88.6 fl (ref 78.0–100.0)
MONOS PCT: 7 % (ref 3.0–12.0)
Monocytes Absolute: 0.4 10*3/uL (ref 0.1–1.0)
NEUTROS ABS: 2.8 10*3/uL (ref 1.4–7.7)
Neutrophils Relative %: 55.4 % (ref 43.0–77.0)
PLATELETS: 353 10*3/uL (ref 150.0–400.0)
RBC: 4.69 Mil/uL (ref 3.87–5.11)
RDW: 14.3 % (ref 11.5–15.5)
WBC: 5 10*3/uL (ref 4.0–10.5)

## 2015-11-01 LAB — URINALYSIS, ROUTINE W REFLEX MICROSCOPIC
BILIRUBIN URINE: NEGATIVE
HGB URINE DIPSTICK: NEGATIVE
Ketones, ur: NEGATIVE
Leukocytes, UA: NEGATIVE
NITRITE: NEGATIVE
RBC / HPF: NONE SEEN (ref 0–?)
Specific Gravity, Urine: 1.02 (ref 1.000–1.030)
TOTAL PROTEIN, URINE-UPE24: NEGATIVE
URINE GLUCOSE: NEGATIVE
UROBILINOGEN UA: 0.2 (ref 0.0–1.0)
pH: 7 (ref 5.0–8.0)

## 2015-11-01 LAB — HEPATIC FUNCTION PANEL
ALBUMIN: 4.3 g/dL (ref 3.5–5.2)
ALT: 24 U/L (ref 0–35)
AST: 25 U/L (ref 0–37)
Alkaline Phosphatase: 112 U/L (ref 39–117)
Bilirubin, Direct: 0.1 mg/dL (ref 0.0–0.3)
TOTAL PROTEIN: 7.2 g/dL (ref 6.0–8.3)
Total Bilirubin: 0.3 mg/dL (ref 0.2–1.2)

## 2015-11-01 LAB — BASIC METABOLIC PANEL
BUN: 12 mg/dL (ref 6–23)
CHLORIDE: 104 meq/L (ref 96–112)
CO2: 31 meq/L (ref 19–32)
CREATININE: 0.63 mg/dL (ref 0.40–1.20)
Calcium: 9.7 mg/dL (ref 8.4–10.5)
GFR: 101.53 mL/min (ref >60.00)
Glucose, Bld: 105 mg/dL — ABNORMAL HIGH (ref 70–99)
Potassium: 4.3 mEq/L (ref 3.5–5.1)
Sodium: 140 mEq/L (ref 135–145)

## 2015-11-01 LAB — LIPID PANEL
CHOLESTEROL: 266 mg/dL — AB (ref 0–200)
HDL: 80.4 mg/dL (ref >39.00)
LDL Cholesterol: 167 mg/dL — ABNORMAL HIGH (ref 0–99)
NonHDL: 185.92
TRIGLYCERIDES: 93 mg/dL (ref 0.0–149.0)
Total CHOL/HDL Ratio: 3
VLDL: 18.6 mg/dL (ref 0.0–40.0)

## 2015-11-01 LAB — TSH: TSH: 0.83 u[IU]/mL (ref 0.35–4.50)

## 2015-11-01 MED ORDER — VENLAFAXINE HCL ER 37.5 MG PO CP24: ORAL_CAPSULE | ORAL | Status: DC

## 2015-11-01 NOTE — Patient Instructions (Addendum)
Please taper effexor per prescription directions. Schedule mammogram and pap. We will call with bone density appointment. Complete lab work prior to leaving. Work on [employer] diet and exercise.  Check with your insurance re: coverage for zostavax and then call us to arrange a nurse visit.

## 2015-11-01 NOTE — Assessment & Plan Note (Signed)
Clinically stable, she is interested in weaning off of effexor. Advised pt as follows:  Will change effexor from 75mg  to 37.5mg .  Take 1 tab daily for 2 weeks, then 1 tab every other day for 1 week. Then 1 tab every 3rd day for a week, then stop

## 2015-11-01 NOTE — Progress Notes (Addendum)
Subjective:    Patient ID: Yolanda Gibbs, female    DOB: [birth year], 65 y.o.   MRN: [id number]  HPI  Ms.  Yolanda Gibbs is a 65 yr old female who presents today for cpx.  Immunizations:flu shot today, will check coverage for zostavax.   Diet: poor Exercise: poor Colonoscopy: due 2020 Dexa: old insurance would not pay.  Pap Smear: GYN- she will call to schedule.  Mammogram: will schedule at [facility]: up to date  Depression/anxiety- well controlled. She has been on effexor for 20 years.    Review of Systems  Constitutional: Negative for fever, fatigue and unexpected weight change.  HENT: Positive for rhinorrhea.   Eyes:       Eye dryness. Sees dr. Hazle Gibbs for floaters  Respiratory:       Chronic cough x 30 years  Cardiovascular: Negative for chest pain, palpitations and leg swelling.  Gastrointestinal:       Chronic constipation- uses prunes and stool softners  Genitourinary: Negative for dysuria and frequency.  Musculoskeletal:       Reports bilateral knee weakness L>R  Skin: Negative for rash.       Reports dry skin  Neurological: Negative for dizziness, numbness and headaches.  Hematological: Negative for adenopathy.  Psychiatric/Behavioral:       Denies anxiety/depression   Past Medical History  Diagnosis Date  . History of chicken pox   . Depression   . GERD (gastroesophageal reflux disease)   . Allergy     seasonal  . History of colon polyps     benign    Social History   Social History  . Marital Status: Married    Spouse Name: N/A  . Number of Children: N/A  . Years of Education: N/A   Occupational History  . Not on file.   Social History Main Topics  . Smoking status: Never Smoker   . Smokeless tobacco: Never Used  . Alcohol Use: 1.8 - 2.4 oz/week    3-4 Standard drinks or equivalent per week  . Drug Use: Not on file  . Sexual Activity: Not on file   Other Topics Concern  . 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   Completed college   Enjoys watching old movies, needlework, cleaning her house   No pets.     No past surgical history on file.  Family History  Problem Relation Age of Onset  . Arthritis Mother   . CAD Mother   . AAA (abdominal aortic aneurysm) Mother   . Cancer Mother     lung  . Stroke Father   . Cancer Maternal Aunt     breast  . Arthritis Maternal Grandmother   . Cancer Maternal Grandfather     prostate  . Depression Paternal Grandmother     Allergies  Allergen Reactions  . Darvon [Propoxyphene]   . Duloxetine     [facility]- patient is unsure   . Propoxyphene Hcl     Current Outpatient Prescriptions on File Prior to Visit  Medication Sig Dispense Refill  . Calcium-Phosphorus-Vitamin D (CITRACAL +D3 PO) Take 1-2 tablets by mouth 2 (two) times daily. Vit d 500 IU, calcium 630mg  each    .  cetirizine (ZYRTEC) 10 MG tablet Take 10 mg by mouth daily.    Tery Sanfilippo Sodium 100 MG capsule Take 100 mg by mouth as needed.    Marland Kitchen esomeprazole (NEXIUM) 40 MG capsule Take 40 mg by mouth daily at 12 noon.    . gabapentin (NEURONTIN) 300 MG capsule Take 300 mg by mouth 3 (three) times daily.    . mometasone (NASONEX) 50 MCG/ACT nasal spray Place 1 spray into the nose daily.    . Multiple Vitamin (MULTIVITAMIN) tablet Take 1 tablet by mouth daily. G[employer] Women's Ultra Mega    . ranitidine (ZANTAC) 300 MG tablet Take 300 mg by mouth at bedtime.    Marland Kitchen venlafaxine XR (EFFEXOR-XR) 75 MG 24 hr capsule TAKE ONE CAPSULE BY MOUTH EVERY DAY 30 capsule 2   No current facility-administered medications on file prior to visit.    BP 116/78 mmHg  Pulse 84  Temp(Src) 98.3 F (36.8 C) (Oral)  Ht  (1.651 m)  Wt 165 lb 3.2 oz (74.934 kg)  BMI 27.49 kg/m2   SpO2 99%  LMP 08/19/2004       Objective:   Physical Exam  Physical Exam  Constitutional: She is oriented to person, place, and time. She appears well-developed and well-nourished. No distress.  HENT:  Head: Normocephalic and atraumatic.  Right Ear: Tympanic membrane and ear canal normal.  Left Ear: Tympanic membrane and ear canal normal.  Mouth/Throat: Oropharynx is clear and moist.  Eyes: Pupils are equal, round, and reactive to light. No scleral icterus.  Neck: Normal range of motion. No thyromegaly present.  Cardiovascular: Normal rate and regular rhythm.   No murmur heard. Pulmonary/Chest: Effort normal and breath sounds normal. No respiratory distress. He has no wheezes. She has no rales. She exhibits no tenderness.  Abdominal: Soft. Bowel sounds are normal. He exhibits no distension and no mass. There is no tenderness. There is no rebound and no guarding.  Musculoskeletal: She exhibits no edema.  Lymphadenopathy:    She has no cervical adenopathy.  Neurological: She is alert and oriented to person, place, and time. She has normal patellar reflexes. She exhibits normal muscle tone. Coordination normal.  Skin: Skin is warm and dry.  Psychiatric: She has a normal mood and affect. Her behavior is normal. Judgment and thought content normal.  Breast/pelvic:  Deferred.         Assessment & Plan:         Assessment & Plan:  Preventative- obtain routine lab work.   EKG tracing is personally reviewed.  EKG notes NSR.  No acute changes.  Advised pt as follows:  Please taper effexor per prescription directions. Schedule mammogram and pap. We will call with bone density appointment. Complete lab work prior to leaving. Work on [employer] and exercise.  Check with your insurance re: coverage for zostavax and then call us to arrange a nurse visit.

## 2015-11-01 NOTE — Progress Notes (Signed)
Pre visit review using our clinic review tool, if applicable. No additional management support is needed unless otherwise documented below in the visit note. 

## 2015-11-01 NOTE — Addendum Note (Signed)
Addended by: Sandford Craze'SULLIVAN, Shaniyah Wix on: 11/01/2015 03:01 PM   Modules accepted: Kipp BroodSmartSet

## 2015-12-04 ENCOUNTER — Ambulatory Visit: Payer: BLUE CROSS/BLUE SHIELD | Admitting: Family

## 2015-12-08 ENCOUNTER — Ambulatory Visit (INDEPENDENT_AMBULATORY_CARE_PROVIDER_SITE_OTHER): Payer: BLUE CROSS/BLUE SHIELD | Admitting: Family

## 2015-12-08 VITALS — HR 80 | Temp 98.2°F | Resp 16 | Ht 65.0 in | Wt 165.2 lb

## 2015-12-08 DIAGNOSIS — R232 Flushing: Secondary | ICD-10-CM

## 2015-12-08 DIAGNOSIS — N951 Menopausal and female climacteric states: Secondary | ICD-10-CM

## 2015-12-08 MED ORDER — VENLAFAXINE HCL ER 75 MG PO CP24: 75.0000 mg | ORAL_CAPSULE | Freq: Every day | ORAL | Status: DC

## 2015-12-08 NOTE — Progress Notes (Signed)
Subjective:    Patient ID: Rhea Belton, female    DOB: 07-11-1951, 65 y.o.   MRN: [id number]  HPI  Ms. Stagner is a 65 yr old female who presents today to discuss wean off of Effexor which she was using for her hot flashes. Her last dose was 11/29/15.  Pt reports that hot flashes are "terrible." Feels spaced out, having dizziness and nausea since she came off of the effexor. Overall mood has been stable except for some mild tearfulness.    Review of Systems See HPI  Past Medical History  Diagnosis Date  . History of chicken pox   . Depression   . GERD (gastroesophageal reflux disease)   . Allergy     seasonal  . History of colon polyps     benign     Social History   Social History  . Marital Status: Married    Spouse Name: N/A  . Number of Children: N/A  . Years of Education: N/A   Occupational History  . Not on file.   Social History Main Topics  . Smoking status: Never Smoker   . Smokeless tobacco: Never Used  . Alcohol Use: 1.8 - 2.4 oz/week    3-4 Standard drinks or equivalent per week  . Drug Use: Not on file  . Sexual Activity: Not on file   Other Topics Concern  . Not on file   Social History Narrative   Daughter at [employer] (Julian Hy) born 63   Daughter Denny Peon 52   Husband is a Curator, has a Forensic scientist, has a Landscape architect helps with day to day of the business   Second marriage- prior to remarrying worked in Passenger transport manager.   Completed college   Enjoys watching old movies, needlework, cleaning her house   No pets.     No past surgical history on file.  Family History  Problem Relation Age of Onset  . Arthritis Mother   . CAD Mother   . AAA (abdominal aortic aneurysm) Mother   . Cancer Mother     lung  . Stroke Father   . Cancer Maternal Aunt     breast  . Arthritis Maternal Grandmother   . Cancer Maternal Grandfather     prostate  . Depression Paternal Grandmother     Allergies  Allergen Reactions  . Darvon  [Propoxyphene] Other (See Comments)    "Slept for 3 days"  . Duloxetine     Added from [facility]- patient is unsure     Current Outpatient Prescriptions on File Prior to Visit  Medication Sig Dispense Refill  . Calcium-Phosphorus-Vitamin D (CITRACAL +D3 PO) Take 1-2 tablets by mouth 2 (two) times daily. Vit d 500 IU, calcium  each    . cetirizine (ZYRTEC) 10 MG tablet Take 10 mg by mouth daily.    Tery Sanfilippo Sodium 100 MG capsule Take 100 mg by mouth as needed.    Marland Kitchen esomeprazole (NEXIUM) 40 MG capsule Take 40 mg by mouth daily at 12 noon.    . mometasone (NASONEX) 50 MCG/ACT nasal spray Place 1 spray into the nose daily as needed.     . Multiple Vitamin (MULTIVITAMIN) tablet Take 1 tablet by mouth daily. GNC Women's Ultra Mega    . gabapentin (NEURONTIN) 300 MG capsule Take 300 mg by mouth 3 (three) times daily. Reported on 12/08/2015    . ranitidine (ZANTAC) 300 MG tablet Take 300 mg by mouth at bedtime. Reported on 12/08/2015  No current facility-administered medications on file prior to visit.    Pulse 80  Temp(Src) 98.2 F (36.8 C) (Oral)  Resp 16  Ht 5\' 5"  (1.651 m)  Wt 165 lb 3.2 oz (74.934 kg)  BMI 27.49 kg/m2  SpO2 99%  LMP 08/19/2004       Objective:   Physical Exam  Constitutional: She is oriented to person, place, and time. She appears well-developed and well-nourished.  HENT:  Right Ear: Tympanic membrane and ear canal normal.  Left Ear: Tympanic membrane and ear canal normal.  Mouth/Throat: No oropharyngeal exudate, posterior oropharyngeal edema or posterior oropharyngeal erythema.  Cardiovascular: Normal rate, regular rhythm and normal heart sounds.   No murmur heard. Pulmonary/Chest: Effort normal and breath sounds normal. No respiratory distress. She has no wheezes.  Neurological: She is alert and oriented to person, place, and time.  Skin: Skin is warm and dry.  Psychiatric: She has a normal mood and affect. Her behavior is normal. Judgment and  thought content normal.          Assessment & Plan:

## 2015-12-08 NOTE — Progress Notes (Signed)
Pre visit review using our clinic review tool, if applicable. No additional management support is needed unless otherwise documented below in the visit note. 

## 2015-12-08 NOTE — Assessment & Plan Note (Signed)
Symptoms have worsened since discontinuing the effexor. She is not tolerating the wean off very well. Wishes to restart Effexor XR.  Refill sent to pharmacy. 15 min spent with pt today. >50% of this time was spent counseling patient on med management, hot flashes.

## 2015-12-08 NOTE — Patient Instructions (Signed)
Restart Effexor XR. If needed, you may add claritin 10mg  once daily. Call if symptoms worsen or if symptoms do not resolve after restarting Effexor.

## 2015-12-11 ENCOUNTER — Ambulatory Visit: Payer: BLUE CROSS/BLUE SHIELD | Admitting: Family

## 2016-03-19 LAB — HM PAP SMEAR: HM PAP: NORMAL

## 2016-04-24 ENCOUNTER — Ambulatory Visit (INDEPENDENT_AMBULATORY_CARE_PROVIDER_SITE_OTHER): Payer: BLUE CROSS/BLUE SHIELD | Admitting: Physician Assistant

## 2016-04-24 ENCOUNTER — Encounter: Payer: Self-pay | Admitting: Physician Assistant

## 2016-04-24 ENCOUNTER — Ambulatory Visit (HOSPITAL_BASED_OUTPATIENT_CLINIC_OR_DEPARTMENT_OTHER)
Admission: RE | Admit: 2016-04-24 | Discharge: 2016-04-24 | Disposition: A | Discharge status: Home / Self Care | Payer: BLUE CROSS/BLUE SHIELD | Source: Ambulatory Visit | Attending: Physician Assistant | Admitting: Physician Assistant

## 2016-04-24 VITALS — BP 124/86 | HR 74 | Temp 98.1°F | Resp 16 | Ht 65.0 in | Wt 168.2 lb

## 2016-04-24 DIAGNOSIS — X58XXXA Exposure to other specified factors, initial encounter: Secondary | ICD-10-CM | POA: Insufficient documentation

## 2016-04-24 DIAGNOSIS — S91332A Puncture wound without foreign body, left foot, initial encounter: Secondary | ICD-10-CM

## 2016-04-24 DIAGNOSIS — M7732 Calcaneal spur, left foot: Secondary | ICD-10-CM | POA: Insufficient documentation

## 2016-04-24 DIAGNOSIS — R229 Localized swelling, mass and lump, unspecified: Secondary | ICD-10-CM | POA: Insufficient documentation

## 2016-04-24 DIAGNOSIS — R399 Unspecified symptoms and signs involving the genitourinary system: Secondary | ICD-10-CM

## 2016-04-24 LAB — POCT URINALYSIS DIPSTICK
Bilirubin, UA: NEGATIVE
Blood, UA: NEGATIVE
Glucose, UA: NEGATIVE
KETONES UA: NEGATIVE
NITRITE UA: NEGATIVE
PH UA: 6
PROTEIN UA: NEGATIVE
Spec Grav, UA: >=1.03
UROBILINOGEN UA: 0.2

## 2016-04-24 MED ORDER — SULFAMETHOXAZOLE-TRIMETHOPRIM 800-160 MG PO TABS
1.0000 | ORAL_TABLET | Freq: Two times a day (BID) | ORAL | 0 refills | Status: DC
Start: 2016-04-24 — End: 2016-05-08

## 2016-04-24 NOTE — Progress Notes (Signed)
Patient presents to clinic today c/o pain and redness of an area of plantar region of left foot after stepping on a nail a few weeks prior. Endorses pain with ambulation. Denies fever, chills, malaise or fatigue. Endorses Tetanus up-to-date. Denies noting breakage of nail or retained foreign body. Was seen at Urgent Care yesterday and x-ray was recommended. Patient was started on Cipro but could not tolerate due to digestive issues.   Patient also endorses vaginal itch with white discharge. Was recently on a few rounds of antibiotics for an oral abscess that has resolved. Is not currently sexually active and has no concern for STI. Endorses some mild urinary urgency and dysuria since this morning.  Past Medical History:  Diagnosis Date  . Allergy    seasonal  . Depression   . GERD (gastroesophageal reflux disease)   . History of chicken pox   . History of colon polyps    benign    Current Outpatient Prescriptions on File Prior to Visit  Medication Sig Dispense Refill  . Calcium-Phosphorus-Vitamin D (CITRACAL +D3 PO) Take 1-2 tablets by mouth 2 (two) times daily. Vit d 500 IU, calcium 630mg  each    . cetirizine (ZYRTEC) 10 MG tablet Take 10 mg by mouth daily.    Tery Sanfilippo Sodium 100 MG capsule Take 100 mg by mouth as needed.    Marland Kitchen esomeprazole (NEXIUM) 40 MG capsule Take 40 mg by mouth daily at 12 noon.    . gabapentin (NEURONTIN) 300 MG capsule Take 300 mg by mouth 3 (three) times daily. Reported on 12/08/2015    . mometasone (NASONEX) 50 MCG/ACT nasal spray Place 1 spray into the nose daily as needed.     . Multiple Vitamin (MULTIVITAMIN) tablet Take 1 tablet by mouth daily. GNC Women's Ultra Mega    . ranitidine (ZANTAC) 300 MG tablet Take 300 mg by mouth at bedtime. Reported on 12/08/2015    . venlafaxine XR (EFFEXOR XR) 75 MG 24 hr capsule Take 1 capsule (75 mg total) by mouth daily with breakfast. 30 capsule 5   No current facility-administered medications on file prior to visit.      Allergies  Allergen Reactions  . Darvon [Propoxyphene] Other (See Comments)    "Slept for 3 days"  . Duloxetine     Added from [facility]- patient is unsure     Family History  Problem Relation Age of Onset  . Arthritis Mother   . CAD Mother   . AAA (abdominal aortic aneurysm) Mother   . Cancer Mother     lung  . Stroke Father   . Cancer Maternal Aunt     breast  . Arthritis Maternal Grandmother   . Cancer Maternal Grandfather     prostate  . Depression Paternal Grandmother     Social History   Social History  . Marital status: Married    Spouse name: N/A  . Number of children: N/A  . Years of education: N/A   Social History Main Topics  . Smoking status: Never Smoker  . Smokeless tobacco: Never Used  . Alcohol use 1.8 - 2.4 oz/week    3 - 4 Standard drinks or equivalent per week  . Drug use: Unknown  . Sexual activity: Not Asked   Other Topics Concern  . 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   Completed college   Enjoys watching old movies, needlework, cleaning her house   No pets.     Review of Systems - See HPI.  All other ROS are negative.  BP 124/86 (BP Location: Left Arm, Patient Position: Sitting, Cuff Size: Normal)   Pulse 74   Temp 98.1 F (36.7 C) (Oral)   Resp 16   Ht 5\' 5"  (1.651 m)   Wt 168 lb 4 oz (76.3 kg)   LMP 08/19/2004   SpO2 98%   BMI 28.00 kg/m   Physical Exam  Constitutional: She is oriented to person, place, and time and well-developed, well-nourished, and in no distress.  HENT:  Head: Normocephalic and atraumatic.  Eyes: Conjunctivae are normal. Pupils are equal, round, and reactive to light.  Neck: Neck supple.  Cardiovascular: Normal rate, regular rhythm, normal heart sounds and  intact distal pulses.   Pulmonary/Chest: Effort normal.  Genitourinary:  Genitourinary Comments: Defers exam today  Musculoskeletal:       Feet:  Neurological: She is alert and oriented to person, place, and time.  Skin: Skin is warm and dry.  Vitals reviewed.   Recent Results (from the past 2160 hour(s))  POCT urinalysis dipstick     Status: Abnormal   Collection Time: 04/24/16  5:03 PM  Result Value Ref Range   Color, UA straw    Clarity, UA clear    Glucose, UA neg    Bilirubin, UA neg    Ketones, UA neg    Spec Grav, UA >=1.030    Blood, UA neg    pH, UA 6.0    Protein, UA neg    Urobilinogen, UA 0.2    Nitrite, UA neg    Leukocytes, UA small (1+) (A) Negative    Assessment/Plan: 1. Puncture wound of left foot, initial encounter No indication for I/D today since there is no observed fluctuance. Doubtful there is a retained metallic body but we will further assess with imaging. I am not sure why she was given Cipro for cellulitis. She has stopped due to intolerance. Will begin Bactrim. FU scheduled. Alarm signs/symptoms discussed that would prompt ER assessment. If foreign body present, will set up with either General Surgery or Podiatry. - DG Foot Complete Left; Future - sulfamethoxazole-trimethoprim (BACTRIM DS,SEPTRA DS) 800-160 MG tablet; Take 1 tablet by mouth 2 (two) times daily.  Dispense: 14 tablet; Refill: 0  2. UTI symptoms Urine dip with some sign of infection. Will send for culture. Also yeast vaginitis symptoms present. Highly likely giving recent ABX usage. The Bactrim given for cellulitis should cover UTI. Will Rx 1 Diflucan for yeast. Supportive measures reviewed.  - CULTURE, URINE COMPREHENSIVE - POCT urinalysis dipstick   Piedad ClimesMartin, Chiann Goffredo Cody, PA-C

## 2016-04-24 NOTE — Patient Instructions (Addendum)
Please go downstairs for imaging.  I will call with your results. Please start the Bactrim as directed. This should take care of the infection in the foot and a UTI if present.  I will call with your culture results. If needed we will set you up with specialist for removal.  If you have any worsening symptoms or develop fever, chills or increased redness, please go to the ER.

## 2016-05-08 ENCOUNTER — Encounter: Payer: Self-pay | Admitting: Physician Assistant

## 2016-05-08 ENCOUNTER — Ambulatory Visit (INDEPENDENT_AMBULATORY_CARE_PROVIDER_SITE_OTHER): Payer: BLUE CROSS/BLUE SHIELD | Admitting: Physician Assistant

## 2016-05-08 DIAGNOSIS — Z23 Encounter for immunization: Secondary | ICD-10-CM | POA: Diagnosis not present

## 2016-05-08 DIAGNOSIS — S91332D Puncture wound without foreign body, left foot, subsequent encounter: Secondary | ICD-10-CM

## 2016-05-08 MED ORDER — SULFAMETHOXAZOLE-TRIMETHOPRIM 800-160 MG PO TABS: 1.0000 | ORAL_TABLET | Freq: Two times a day (BID) | ORAL | 0 refills | Status: DC

## 2016-05-08 NOTE — Patient Instructions (Addendum)
Please restart antibiotic for now.  I am setting you up with Podiatry for further assessment just to make sure they do not feel anything further is needed. The area does look much improved from the last time, which is great.  Keep skin clean and dry. Keep the area covered.  If you note any worsening symptoms or fever, please go to the ER.

## 2016-05-08 NOTE — Progress Notes (Signed)
Patient presents to clinic today for follow-up of puncture wound, left foot with infection, recently treated with antibiotics. Endorses marked improvement in symptoms while on antibiotic. Since completion, noted recurrence of symptoms including mild redness, and intermittent pain. Xray obtained at last visit negative for foreign body (patient had stepped on nail). Denies fever, chills, malaise or fatigue. Is able to ambulate without difficulty.   Past Medical History:  Diagnosis Date  . Allergy    seasonal  . Depression   . GERD (gastroesophageal reflux disease)   . History of chicken pox   . History of colon polyps    benign    Current Outpatient Prescriptions on File Prior to Visit  Medication Sig Dispense Refill  . Calcium-Phosphorus-Vitamin D (CITRACAL +D3 PO) Take 1-2 tablets by mouth 2 (two) times daily. Vit d 500 IU, calcium 630mg  each    . cetirizine (ZYRTEC) 10 MG tablet Take 10 mg by mouth daily.    Tery Sanfilippo. Docusate Sodium 100 MG capsule Take 100 mg by mouth as needed.    Marland Kitchen. esomeprazole (NEXIUM) 40 MG capsule Take 40 mg by mouth daily at 12 noon.    . gabapentin (NEURONTIN) 300 MG capsule Take 300 mg by mouth 3 (three) times daily. Reported on 12/08/2015    . mometasone (NASONEX) 50 MCG/ACT nasal spray Place 1 spray into the nose daily as needed.     . Multiple Vitamin (MULTIVITAMIN) tablet Take 1 tablet by mouth daily. GNC Women's Ultra Mega    . ranitidine (ZANTAC) 300 MG tablet Take 300 mg by mouth at bedtime. Reported on 12/08/2015    . sulfamethoxazole-trimethoprim (BACTRIM DS,SEPTRA DS) 800-160 MG tablet Take 1 tablet by mouth 2 (two) times daily. 14 tablet 0  . venlafaxine XR (EFFEXOR XR) 75 MG 24 hr capsule Take 1 capsule (75 mg total) by mouth daily with breakfast. 30 capsule 5   No current facility-administered medications on file prior to visit.     Allergies  Allergen Reactions  . Darvon [Propoxyphene] Other (See Comments)    "Slept for 3 days"  . Duloxetine    [facility]- patient is unsure     Family History  Problem Relation Age of Onset  . Arthritis Mother   . CAD Mother   . AAA (abdominal aortic aneurysm) Mother   . Cancer Mother     lung  . Stroke Father   . Cancer Maternal Aunt     breast  . Arthritis Maternal Grandmother   . Cancer Maternal Grandfather     prostate  . Depression Paternal Grandmother     Social History   Social History  . Marital status: Married    Spouse name: N/A  . Number of children: N/A  . Years of education: N/A   Social History Main Topics  . Smoking status: Never Smoker  . Smokeless tobacco: Never Used  . Alcohol use 1.8 - 2.4 oz/week    3 - 4 Standard drinks or equivalent per week  . Drug use: Unknown  . Sexual activity: Not on file   Other Topics Concern  . 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   Completed college   Enjoys watching old movies, needlework,  cleaning her house   No pets.     Review of Systems - See HPI.  All other ROS are negative.  LMP 08/19/2004   Physical Exam  Constitutional: She is oriented to person, place, and time and well-developed, well-nourished, and in no distress.  HENT:  Head: Normocephalic and atraumatic.  Cardiovascular: Normal rate, regular rhythm, normal heart sounds and intact distal pulses.   Pulmonary/Chest: Effort normal and breath sounds normal. No respiratory distress. She has no rales. She exhibits no tenderness.  Musculoskeletal:       Left ankle: Normal.       Feet:  Neurological: She is alert and oriented to person, place, and time.  Skin: Skin is warm and dry.  Vitals reviewed.   Recent Results (from the past 2160 hour(s))  POCT urinalysis dipstick     Status: Abnormal    Collection Time: 04/24/16  5:03 PM  Result Value Ref Range   Color, UA straw    Clarity, UA clear    Glucose, UA neg    Bilirubin, UA neg    Ketones, UA neg    Spec Grav, UA >=1.030    Blood, UA neg    pH, UA 6.0    Protein, UA neg    Urobilinogen, UA 0.2    Nitrite, UA neg    Leukocytes, UA small (1+) (A) Negative    Assessment/Plan: 1. Puncture wound of left foot, subsequent encounter Looks much improved since last visit. Responded well to Bactrim. Initial cellulitis had resolved. Discussed overall induration would take weeks to completely resolve. Questionable mild recurrence of infection just around puncture wound. Will restart Bactrim. Referral to Podiatry placed for further assessment. Will see she gets scheduled this week. Alarm signs/symptoms discussed with patient that would prompt ER assessment. Patient voices understanding and agreement. - sulfamethoxazole-trimethoprim (BACTRIM DS,SEPTRA DS) 800-160 MG tablet; Take 1 tablet by mouth 2 (two) times daily.  Dispense: 14 tablet; Refill: 0 - Ambulatory referral to Podiatry   Piedad Climes, PA-C

## 2016-05-08 NOTE — Progress Notes (Signed)
Pre visit review using our clinic review tool, if applicable. No additional management support is needed unless otherwise documented below in the visit note/SLS  

## 2016-05-08 NOTE — Progress Notes (Signed)
Pre visit review using our clinic review tool, if applicable. No additional management support is needed unless otherwise documented below in the visit note. 

## 2016-05-09 ENCOUNTER — Ambulatory Visit (INDEPENDENT_AMBULATORY_CARE_PROVIDER_SITE_OTHER): Payer: BLUE CROSS/BLUE SHIELD | Admitting: Podiatry

## 2016-05-09 DIAGNOSIS — S91302A Unspecified open wound, left foot, initial encounter: Secondary | ICD-10-CM

## 2016-05-09 DIAGNOSIS — L02612 Cutaneous abscess of left foot: Secondary | ICD-10-CM | POA: Diagnosis not present

## 2016-05-09 DIAGNOSIS — M79672 Pain in left foot: Secondary | ICD-10-CM | POA: Diagnosis not present

## 2016-05-09 DIAGNOSIS — L02619 Cutaneous abscess of unspecified foot: Secondary | ICD-10-CM | POA: Diagnosis not present

## 2016-05-09 DIAGNOSIS — S91332D Puncture wound without foreign body, left foot, subsequent encounter: Secondary | ICD-10-CM

## 2016-05-09 DIAGNOSIS — L03119 Cellulitis of unspecified part of limb: Secondary | ICD-10-CM

## 2016-05-09 NOTE — Progress Notes (Signed)
   Subjective:    Patient ID: Yolanda Gibbs, female    DOB: 04/26/1951, 65 y.o.   MRN: [id number]  HPI    Review of Systems  All other systems reviewed and are negative.      Objective:   Physical Exam        Assessment & Plan:

## 2016-05-09 NOTE — Patient Instructions (Signed)

## 2016-05-12 NOTE — Progress Notes (Signed)
Patient ID: Yolanda BeltonDeborah Gibbs, female   DOB: [birth year], 65 y.o.   MRN: [id number] Subjective: 65 year old female presents today for evaluation of pain noted to the left foot. Patient states that partially 2 months ago she stepped on a nail. She states that the nail did come out however she has experienced severe pain and tenderness to the left plantar foot for the past 2 months now. Patient did present to the urgent care recently where x-rays were taken. X-rays were negative for any foreign body noted within the foot. Patient is currently on Bactrim.   Objective: Physical Exam General: The patient is alert and oriented x3 in no acute distress.  Dermatology: Open lesions noted to the plantar aspect of the foot with purulent drainage. Palpable abscesses noted underlying the overlying skin lesions. Upon palpation and expression there is purulent drainage noted. Periwound is moderately erythematous.   Skin is warm, dry and supple bilateral lower extremities. Negative for open lesions or macerations.  Vascular: Palpable pedal pulses bilaterally. No edema or erythema noted. Capillary refill within normal limits.  Neurological: Epicritic and protective threshold grossly intact bilaterally.   Musculoskeletal Exam: Significant pain on palpation to the palpable abscesses noted within the plantar aspect of the left foot. Range of motion within normal limits to all pedal and ankle joints bilateral. Muscle strength 5/5 in all groups bilateral.   Assessment: #1 open wounds purulent drainage left plantar foot #2 multiple soft tissue abscesses noted to the left foot #3 pain in left foot #4 cellulitis left foot  Problem List Items Addressed This Visit    None    Visit Diagnoses    Puncture wound of plantar aspect of left foot, subsequent encounter    -  Primary   Relevant Orders   WOUND CULTURE      Plan of Care:  #1 Patient was evaluated. #2 Today cultures were taken of the drainage of the open  wounds to the left plantar foot.  #3 due to the multiple abscesses and the failure of conservative treatment to alleviate any symptoms, the patient is scheduled for surgical intervention. Incision and drainage will be scheduled to the left foot abscesses - multiple abscesses, complicated.  #4 dry sterile dressing applied #5 patient is to return to the clinic 1 week postop    Dr. Felecia ShellingBrent M. Able Malloy, DPM [facility]

## 2016-05-16 ENCOUNTER — Encounter: Payer: Self-pay | Admitting: Podiatry

## 2016-05-16 DIAGNOSIS — L02612 Cutaneous abscess of left foot: Secondary | ICD-10-CM | POA: Diagnosis not present

## 2016-05-21 ENCOUNTER — Telehealth: Payer: Self-pay | Admitting: *Deleted

## 2016-05-21 NOTE — Telephone Encounter (Signed)
No that's perfect. Just reinforce dressing and see me at appt.  Thanks

## 2016-05-21 NOTE — Telephone Encounter (Signed)
Pt states the surgery foot started draining yesterday, as far as she knows.  I asked pt if she had been up on the foot more than usual and she said yes.  I told pt that could have contribute to the drainage, may possibly had increased swelling that may have opened the suture line. I told pt to get off her foot and rest, that I felt she would be okay to see Dr. Logan BoresEvans at her scheduled appt in the morning and I would inform Dr. Logan BoresEvans, call again if he wanted to prescribe an antibiotic. Pt states she is on an antibiotic.

## 2016-05-22 ENCOUNTER — Telehealth: Payer: Self-pay | Admitting: *Deleted

## 2016-05-22 ENCOUNTER — Ambulatory Visit (INDEPENDENT_AMBULATORY_CARE_PROVIDER_SITE_OTHER): Payer: BLUE CROSS/BLUE SHIELD | Admitting: Podiatry

## 2016-05-22 ENCOUNTER — Encounter: Payer: Self-pay | Admitting: Podiatry

## 2016-05-22 VITALS — BP 156/100 | HR 86 | Temp 98.2°F | Resp 16

## 2016-05-22 DIAGNOSIS — L03119 Cellulitis of unspecified part of limb: Secondary | ICD-10-CM

## 2016-05-22 DIAGNOSIS — L02619 Cutaneous abscess of unspecified foot: Secondary | ICD-10-CM

## 2016-05-22 DIAGNOSIS — Z9889 Other specified postprocedural states: Secondary | ICD-10-CM

## 2016-05-22 DIAGNOSIS — S91302D Unspecified open wound, left foot, subsequent encounter: Secondary | ICD-10-CM

## 2016-05-22 NOTE — Telephone Encounter (Addendum)
Dr. Logan BoresEvans ordered Collagen Prisma to be packed in left plantar foot deep abscesses x3 in the same wound site qod, 3.0cm x 3.0cm x 1.0cm with moderate drainage, cover area with antibicrobial gauze sponge, 2" kling and coban or lightly ace wrap. Faxed to [facility]. 05/22/2016-DrLogan Bores. Evans ordered [facility], every other day to irrigate and rinse left plantar forefoot abscesses with SNS, dry, then pack wounds with Collagen/Prisma, cover with antimicrobial gauze sponge, 2" kling, coban or lightly ace wrap. 05/23/2016-Pt states the home health nurse has not come by, they said there was a problem with some of the insurance information. Pt asked if Dr. Logan BoresEvans wanted to change her dressing. Dr. Logan BoresEvans states if the dressing remains dry on outside can wait until 05/27/2016. Ivor Reining. Adams - [facility] stopped by 4:40pm states they are out-of-network with pt's insurance. 05/24/2016-Unable to contact pt to tell her Dr. Logan BoresEvans recommendation and that I would be searching for Home Health facility for her. Faxed required [facility] form, orders, pt demographic and clinical 05/09/2016 and will fax LOV 05/22/2016 when in. I informed pt of [facility] inability to take as pt and Dr. Logan BoresEvans recommendation that if the dressing remained dry on the outside she could wait until 05/27/2016. Pt states she is staying off the foot and it is dry. 05/24/2016-Naomi Frances [place]- [facility] states unable to accept pt due toe staffing. Called [facility] began checking network status for wound care with them to begin 05/29/2016. Appt with Dr. Logan BoresEvans on 05/27/2016 due to no home health care established over weekend. [facility] states can take pt. [facility] [place] [facility] [phone]. 05/27/2016-[facility] states pt is not homebound, so they can't take as home health pt. Pt also states she is going out of town with her dtr on Thursday to whenever. 05/28/2016-DrLogan Bores. Evans states have pt schedule  weekly with him for evaluation and treatment of the left foot.  Informed pt and transferred to schedulers.

## 2016-05-24 NOTE — Telephone Encounter (Signed)
Okay, sorry, I just wrote her note from this week. I'll be more than happy to see her and get a dressing change done. Thanks Joya SanValery!

## 2016-05-24 NOTE — Progress Notes (Addendum)
Subjective:  Patient presents today for first postop visit for an incision and drainage to the left foot. Patient states there has been drainage noted and pain is moderate.     Objective/Physical Exam General: The patient is alert and oriented x3 in no acute distress.  Dermatology: Deep wounds noted from the abscess drainage to the left foot plantarly. 3 separate wounds are noted to the left plantar forefoot measuring a total area of 3 cm in diameter x 2 cm in depth. Iodoform packing is intact from surgery.  Vascular: Palpable pedal pulses bilaterally. No edema or erythema noted. Capillary refill within normal limits.  Neurological: Epicritic and protective threshold grossly intact bilaterally.   Musculoskeletal Exam: Range of motion within normal limits to all pedal and ankle joints bilateral. Muscle strength 5/5 in all groups bilateral.   Assessment: #1 open wounds left plantar forefoot. #2 postoperative state left plantar forefoot incision and drainage of multiple abscesses #3 pain in left foot   Plan of Care:  #1 Patient was evaluated. #2 today the packing was removed to the left plantar foot wounds. Dry sterile dressing was applied. #3 today patient will require orders for home health wound dressing changes to be applied every other day 6 weeks. - Clean with normal saline. Dry. - Pack absent assess wound formations with collagen packing.  - Apply 4 x 4 gauze, Kerlix, and 4" Ace wrap. - Every other day 6 weeks #4 today wound cultures and pathology was reviewed which were negative for infection or any other pathology other than simple abscess.  [place], so [place].   Dr. Felecia ShellingBrent M. Jubal Rademaker, DPM [facility]

## 2016-05-27 ENCOUNTER — Ambulatory Visit (INDEPENDENT_AMBULATORY_CARE_PROVIDER_SITE_OTHER): Payer: BLUE CROSS/BLUE SHIELD | Admitting: Podiatry

## 2016-05-27 DIAGNOSIS — Z9889 Other specified postprocedural states: Secondary | ICD-10-CM

## 2016-05-27 DIAGNOSIS — S91302D Unspecified open wound, left foot, subsequent encounter: Secondary | ICD-10-CM

## 2016-05-27 NOTE — Progress Notes (Signed)
DOS 09.28.2017 Incision and Drainage Multiple Abscesses Left Plantar Foot

## 2016-05-28 NOTE — Telephone Encounter (Signed)
So I just saw the patient yesterday. The wound looks great and is improving. Drainage is minimal. She is going on vacation this weekend. I'm more than happy to just see her once a week on my schedule. Could we contact her and just get her an appt next week as soon as she gets back from vacation?  Thanks!

## 2016-05-29 ENCOUNTER — Ambulatory Visit: Payer: BLUE CROSS/BLUE SHIELD | Admitting: Podiatry

## 2016-06-03 ENCOUNTER — Other Ambulatory Visit: Payer: Self-pay | Admitting: Family

## 2016-06-03 NOTE — Telephone Encounter (Signed)
Last seen 05/08/16 Last filled #[id number] 12/08/15 Please advsie    PC

## 2016-06-04 ENCOUNTER — Other Ambulatory Visit: Payer: Self-pay | Admitting: Family

## 2016-06-05 ENCOUNTER — Ambulatory Visit (INDEPENDENT_AMBULATORY_CARE_PROVIDER_SITE_OTHER): Payer: BLUE CROSS/BLUE SHIELD | Admitting: Podiatry

## 2016-06-05 DIAGNOSIS — S91302D Unspecified open wound, left foot, subsequent encounter: Secondary | ICD-10-CM

## 2016-06-05 DIAGNOSIS — Z9889 Other specified postprocedural states: Secondary | ICD-10-CM

## 2016-06-09 NOTE — Progress Notes (Signed)
Subjective: Patient presents today postop incision and drainage left plantar foot. Patient states there has been drainage in moderate pain and edema.  Objective: Open wounds noted to the plantar aspect of the incision site for the incision and drainage. There is serosanguineous drainage noted.  Assessment: #1 open wound left plantar forefoot #2 status post left plantar forefoot I&D multiple abscesses #3 pain in left foot next  Plan of care: Continue daily dressing changes at home. Orders for home health care given today. [facility] [place] [facility] [place] [facility] [place] [facility]. Patient is to return to clinic in 2 weeks.

## 2016-06-09 NOTE — Progress Notes (Signed)
Subjective: Patient presents today for fourth postop visit for a puncture wound incision and drainage the left foot. Patient states that she feels significantly better with an extensive amount healing.  Objective: Open wounds noted to the left plantar foot. Secondary healing is observed with over 90% reepithelialization of the incision and drainage site. Minimal erythema with edema noted.  Assessment: Status post puncture wound incision and drainage left plantar foot  Plan of care: Patient was evaluated today. Patient is 99% improved with almost complete healing to the left plantar foot. Patient will be discharged today. Return when necessary.

## 2016-06-10 ENCOUNTER — Ambulatory Visit: Payer: BLUE CROSS/BLUE SHIELD | Admitting: Podiatry

## 2016-06-10 ENCOUNTER — Ambulatory Visit: Payer: BLUE CROSS/BLUE SHIELD | Admitting: Family

## 2016-08-29 ENCOUNTER — Other Ambulatory Visit: Payer: Self-pay | Admitting: Family

## 2016-08-30 NOTE — Telephone Encounter (Signed)
30 tablets effexor Xr sent to pharmacy. Pt was due for 6 month f/u with [name] in October and is past due. She will need to be seen in the office for further refills. Please call pt to schedule appt soon!  Thanks!

## 2016-09-03 NOTE — Telephone Encounter (Signed)
Pt has been scheduled.  °

## 2016-09-16 ENCOUNTER — Ambulatory Visit (INDEPENDENT_AMBULATORY_CARE_PROVIDER_SITE_OTHER): Payer: BLUE CROSS/BLUE SHIELD | Admitting: Family

## 2016-09-16 ENCOUNTER — Encounter: Payer: Self-pay | Admitting: Family

## 2016-09-16 DIAGNOSIS — R232 Flushing: Secondary | ICD-10-CM

## 2016-09-16 DIAGNOSIS — K219 Gastro-esophageal reflux disease without esophagitis: Secondary | ICD-10-CM | POA: Diagnosis not present

## 2016-09-16 MED ORDER — RANITIDINE HCL 150 MG PO CAPS: 150.0000 mg | ORAL_CAPSULE | Freq: Two times a day (BID) | ORAL | 5 refills | Status: DC

## 2016-09-16 NOTE — Patient Instructions (Addendum)
Stop nexium. Change ranitidine to 150mg  twice daily.  Let me know if your reflux symptom worsen on this regimen.

## 2016-09-16 NOTE — Progress Notes (Signed)
Subjective:    Patient ID: Yolanda Gibbs, female    DOB: 09-Jun-1951, 66 y.o.   MRN: [id number]  HPI  Yolanda Gibbs is a 66 yr old female who presents today to discuss hot flashes. Last visit we restarted effexor for her hot flashes.  She notes that she has only occasional hot flashes no and feels better back on effexor. She wishes to continue.   GERD- reports symptoms are well controlled. She is currently maintained on nexium daily and zantac 300mg  PO QHS.    Review of Systems See HPI  Past Medical History:  Diagnosis Date  . Allergy    seasonal  . Depression   . GERD (gastroesophageal reflux disease)   . History of chicken pox   . History of colon polyps    benign     Social History   Social History  . Marital status: Married    Spouse name: N/A  . Number of children: N/A  . Years of education: N/A   Occupational History  . Not on file.   Social History Main Topics  . Smoking status: Never Smoker  . Smokeless tobacco: Never Used  . Alcohol use 1.8 - 2.4 oz/week    3 - 4 Standard drinks or equivalent per week  . Drug use: No  . Sexual activity: Not on file   Other Topics Concern  . 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   Completed college   Enjoys watching old movies, needlework, cleaning her house   No pets.     No past surgical history on file.  Family History  Problem Relation Age of Onset  . Arthritis Mother   . CAD Mother   . AAA (abdominal aortic aneurysm) Mother   . Cancer Mother     lung  . Stroke Father   . Cancer Maternal Aunt     breast  . Arthritis Maternal Grandmother   . Cancer Maternal Grandfather     prostate  . Depression Paternal Grandmother     Allergies  Allergen Reactions  . Darvon  [Propoxyphene] Other (See Comments)    "Slept for 3 days"  . Duloxetine     [facility]- patient is unsure     Current Outpatient Prescriptions on File Prior to Visit  Medication Sig Dispense Refill  . Calcium-Phosphorus-Vitamin D (CITRACAL +D3 PO) Take 1-2 tablets by mouth 2 (two) times daily. Vit d 500 IU, calcium 630mg  each    . cetirizine (ZYRTEC) 10 MG tablet Take 10 mg by mouth daily.    Tery Sanfilippo. Docusate Sodium 100 MG capsule Take 100 mg by mouth as needed.    . gabapentin (NEURONTIN) 300 MG capsule Take 300 mg by mouth 3 (three) times daily. Reported on 12/08/2015    . mometasone (NASONEX) 50 MCG/ACT nasal spray Place 1 spray into the nose daily as needed.     . Multiple Vitamin (MULTIVITAMIN) tablet Take 1 tablet by mouth daily. [employer] Women's Ultra Mega    . venlafaxine XR (EFFEXOR-XR) 75 MG 24 hr capsule TAKE ONE CAPSULE BY MOUTH DAILY WITH BREAKFAST 30 capsule 0  . nabumetone (RELAFEN) 750 MG tablet Take 750 mg by mouth 2 (two) times daily.    .Marland Kitchen  oxyCODONE-acetaminophen (PERCOCET/ROXICET) 5-325 MG tablet Take 1 tablet by mouth every 4 (four) hours as needed for severe pain.    Marland Kitchen sulfamethoxazole-trimethoprim (BACTRIM DS,SEPTRA DS) 800-160 MG tablet Take 1 tablet by mouth 2 (two) times daily. (Patient not taking: Reported on 09/16/2016) 14 tablet 0   No current facility-administered medications on file prior to visit.     BP 139/85 (BP Location: Right Arm, Cuff Size: Normal)   Pulse 79   Temp 98.5 F (36.9 C) (Oral)   Resp 16   Ht 5\' 5"  (1.651 m)   Wt 168 lb 3.2 oz (76.3 kg)   LMP 08/19/2004   SpO2 99% Comment: room air  BMI 27.99 kg/m       Objective:   Physical Exam  Constitutional: She appears well-developed and well-nourished.  Cardiovascular: Normal rate, regular rhythm and normal heart sounds.   No murmur heard. Pulmonary/Chest: Effort normal and breath sounds normal. No respiratory distress. She has no wheezes.  Psychiatric: She has a normal mood and  affect. Her behavior is normal. Judgment and thought content normal.          Assessment & Plan:

## 2016-09-16 NOTE — Progress Notes (Signed)
Pre visit review using our clinic review tool, if applicable. No additional management support is needed unless otherwise documented below in the visit note. 

## 2016-09-16 NOTE — Assessment & Plan Note (Signed)
Will give trial off of nexium. Will change zantac from 300mg  QHS to 150mg  bid.

## 2016-09-16 NOTE — Assessment & Plan Note (Signed)
Stable on effexor. Continue same.  

## 2016-09-29 ENCOUNTER — Other Ambulatory Visit: Payer: Self-pay | Admitting: Family

## 2016-12-16 ENCOUNTER — Ambulatory Visit (INDEPENDENT_AMBULATORY_CARE_PROVIDER_SITE_OTHER): Payer: BLUE CROSS/BLUE SHIELD | Admitting: Family

## 2016-12-16 VITALS — BP 134/80 | HR 67 | Temp 98.4°F | Resp 16 | Ht 65.0 in | Wt 163.4 lb

## 2016-12-16 DIAGNOSIS — R232 Flushing: Secondary | ICD-10-CM

## 2016-12-16 DIAGNOSIS — E2839 Other primary ovarian failure: Secondary | ICD-10-CM

## 2016-12-16 DIAGNOSIS — L729 Follicular cyst of the skin and subcutaneous tissue, unspecified: Secondary | ICD-10-CM

## 2016-12-16 DIAGNOSIS — K219 Gastro-esophageal reflux disease without esophagitis: Secondary | ICD-10-CM | POA: Diagnosis not present

## 2016-12-16 MED ORDER — RANITIDINE HCL 300 MG PO TABS: 300.0000 mg | ORAL_TABLET | Freq: Every day | ORAL | 5 refills | Status: DC

## 2016-12-16 MED ORDER — ESOMEPRAZOLE MAGNESIUM 40 MG PO CPDR: 40.0000 mg | DELAYED_RELEASE_CAPSULE | Freq: Every day | ORAL | 3 refills | Status: DC

## 2016-12-16 NOTE — Progress Notes (Signed)
Pre visit review using our clinic review tool, if applicable. No additional management support is needed unless otherwise documented below in the visit note. 

## 2016-12-16 NOTE — Patient Instructions (Signed)
You will be contacted about your referral to the surgeon and your bone density. Let us know if you have not heard back in 1 week about these appointments. Restart nexium once daily in the AM and zantac  once daily in the PM. Let us know if your cough does not improve in the next few weeks.

## 2016-12-16 NOTE — Progress Notes (Signed)
Subjective:    Patient ID: Yolanda Gibbs, female    DOB: 1950-11-29, 66 y.o.   MRN: [id number]  HPI  [name] is a 66 yr old female who presents today for follow up.  GERD- notes worsening cough since she was changed to zantac from nexium.  Hot flashes- occasional.  Continues effexor and feels that her symptoms are tolerable.  Scalp cyst- notes cyst near hairline and she is interested in having this removed.     Review of Systems    see HPI  Past Medical History:  Diagnosis Date  . Allergy    seasonal  . Depression   . GERD (gastroesophageal reflux disease)   . History of chicken pox   . History of colon polyps    benign     Social History   Social History  . Marital status: Married    Spouse name: N/A  . Number of children: N/A  . Years of education: N/A   Occupational History  . Not on file.   Social History Main Topics  . Smoking status: Never Smoker  . Smokeless tobacco: Never Used  . Alcohol use 1.8 - 2.4 oz/week    3 - 4 Standard drinks or equivalent per week  . Drug use: No  . Sexual activity: Not on file   Other Topics Concern  . Not on file   Social History Narrative   Daughter at [employer] (Julian Hy) born 21   Daughter Denny Peon 33   Husband is a Curator, has a Forensic scientist, has a Landscape architect helps with day to day of the business   Second marriage- prior to remarrying worked in Passenger transport manager.   Completed college   Enjoys watching old movies, needlework, cleaning her house   No pets.     No past surgical history on file.  Family History  Problem Relation Age of Onset  . Arthritis Mother   . CAD Mother   . AAA (abdominal aortic aneurysm) Mother   . Cancer Mother     lung  . Stroke Father   . Cancer Maternal Aunt     breast  . Arthritis Maternal Grandmother   . Cancer Maternal Grandfather     prostate  . Depression Paternal Grandmother     Allergies  Allergen Reactions  . Darvon [Propoxyphene] Other (See  Comments)    "Slept for 3 days"  . Duloxetine     Added [facility]- patient is unsure     Current Outpatient Prescriptions on File Prior to Visit  Medication Sig Dispense Refill  . Calcium-Phosphorus-Vitamin D (CITRACAL +D3 PO) Take 1-2 tablets by mouth 2 (two) times daily. Vit d 500 IU, calcium  each    . cetirizine (ZYRTEC) 10 MG tablet Take 10 mg by mouth daily.    Tery Sanfilippo Sodium 100 MG capsule Take 100 mg by mouth as needed.    . gabapentin (NEURONTIN) 300 MG capsule Take 300 mg by mouth 3 (three) times daily. Reported on 12/08/2015    . mometasone (NASONEX) 50 MCG/ACT nasal spray Place 1 spray into the nose daily as needed.     . Multiple Vitamin (MULTIVITAMIN) tablet Take 1 tablet by mouth daily. [employer] Women's Ultra Mega    . venlafaxine XR (EFFEXOR-XR) 75 MG 24 hr capsule TAKE ONE CAPSULE BY MOUTH DAILY WITH BREAKFAST 30 capsule 5   No current facility-administered medications on file prior to visit.     BP 134/80 (BP Location: Right Arm,  Cuff Size: Normal)   Pulse 67   Temp 98.4 F (36.9 C) (Oral)   Resp 16   Ht  (1.651 m)   Wt 163 lb 6.4 oz (74.1 kg)   LMP 08/19/2004   SpO2 98%   BMI 27.19 kg/m    Objective:   Physical Exam  Constitutional: She is oriented to person, place, and time. She appears well-developed and well-nourished.  HENT:  Non-Tender/firm cyst noted on hairline   Cardiovascular: Normal rate, regular rhythm and normal heart sounds.   No murmur heard. Pulmonary/Chest: Effort normal and breath sounds normal. No respiratory distress. She has no wheezes.  Musculoskeletal: She exhibits no edema.  Neurological: She is alert and oriented to person, place, and time.  Psychiatric: She has a normal mood and affect. Her behavior is normal. Judgment and thought content normal.  skin: subdermal firm cyst noted near hairline on forehead        Assessment & Plan:  Scalp cyst- will refer to general surgeon for removal.  [name]- Advised  pt: Restart nexium once daily in the AM and zantac  once daily in the PM. Let us know if your cough does not improve in the next few weeks.

## 2016-12-18 NOTE — Assessment & Plan Note (Signed)
Stable on effexor, continue same. 

## 2016-12-23 ENCOUNTER — Encounter: Payer: Self-pay | Admitting: Family

## 2016-12-23 ENCOUNTER — Ambulatory Visit (HOSPITAL_BASED_OUTPATIENT_CLINIC_OR_DEPARTMENT_OTHER)
Admission: RE | Admit: 2016-12-23 | Discharge: 2016-12-23 | Disposition: A | Discharge status: Home / Self Care | Payer: BLUE CROSS/BLUE SHIELD | Source: Ambulatory Visit | Attending: Family | Admitting: Family

## 2016-12-23 DIAGNOSIS — E2839 Other primary ovarian failure: Secondary | ICD-10-CM | POA: Diagnosis present

## 2016-12-23 DIAGNOSIS — Z13820 Encounter for screening for osteoporosis: Secondary | ICD-10-CM | POA: Diagnosis not present

## 2016-12-30 ENCOUNTER — Ambulatory Visit: Payer: Self-pay | Admitting: Surgery

## 2016-12-30 NOTE — H&P (Signed)
History of Present Illness Yolanda Gibbs(Willmer Fellers K. Airi Copado MD; 12/30/2016 12:29 PM) The patient is a 66 year old female who presents with a complaint of Mass. Referred by Sandford CrazeMelissa O'Sullivan NP for forehead mass  This is a 66 year old female who presents with a several year history of a slowly enlarging mass on her anterior scalp just above the hairline. She has notices becoming larger and it is occasionally uncomfortable. There is never been any infection or drainage in this area. She presents now to discuss excision.   Past Surgical History [name], [name]; 12/30/2016 10:35 AM) Colon Polyp Removal - Colonoscopy Foot Surgery Left. Oral Surgery  Diagnostic Studies History [name], [name]; 12/30/2016 10:35 AM) Colonoscopy 1-5 years ago Mammogram within last year Pap Smear 1-5 years ago  Allergies [name], [name]; 12/30/2016 10:37 AM) Darvon *ANALGESICS - OPIOID* Allergies Reconciled  Medication History [name], RMA; 12/30/2016 10:39 AM) Esomeprazole Magnesium (40MG  Capsule DR, Oral) Active. RaNITidine HCl (300MG  Tablet, Oral) Active. Venlafaxine HCl ER (75MG  Capsule ER 24HR, Oral) Active. Gabapentin (300MG  Capsule, Oral) Active. ZyrTEC Allergy (10MG  Tablet, Oral) Active. Nasonex (50MCG/ACT Suspension, Nasal) Active. Medications Reconciled  Social History [name], [name]; 12/30/2016 10:35 AM) Alcohol use Occasional alcohol use. Caffeine use Carbonated beverages. No drug use Tobacco use Never smoker.  Family History [name], [name]; 12/30/2016 10:35 AM) Anesthetic complications Mother. Arthritis Sister. Breast Cancer Family Members In General. Diabetes Mellitus Family Members In General. Heart Disease Father. Melanoma Family Members In General. Prostate Cancer Family Members In General. Thyroid problems Mother.  Pregnancy / Birth History [name], [name]; 12/30/2016 10:35 AM) Age at menarche 15 years. Age of menopause 5546-50 Contraceptive History  Oral contraceptives. Gravida 3 Length (months) of breastfeeding 7-12 Maternal age 64-30 Para 2  Other Problems [name], [name]; 12/30/2016 10:35 AM) Back Pain Gastroesophageal Reflux Disease     Review of Systems [name](Rosa Rogers RMA; 12/30/2016 10:35 AM) General Present- Weight Gain. Not Present- Appetite Loss, Chills, Fatigue, Fever, Night Sweats and Weight Loss. Skin Not Present- Change in Wart/Mole, Dryness, Hives, Jaundice, New Lesions, Non-Healing Wounds, Rash and Ulcer. HEENT Present- Ringing in the Ears. Not Present- Earache, Hearing Loss, Hoarseness, Nose Bleed, Oral Ulcers, Seasonal Allergies, Sinus Pain, Sore Throat, Visual Disturbances, Wears glasses/contact lenses and Yellow Eyes. Respiratory Present- Chronic Cough. Not Present- Bloody sputum, Difficulty Breathing, Snoring and Wheezing. Cardiovascular Not Present- Chest Pain, Difficulty Breathing Lying Down, Leg Cramps, Palpitations, Rapid Heart Rate, Shortness of Breath and Swelling of Extremities. Gastrointestinal Present- Bloating and Constipation. Not Present- Abdominal Pain, Bloody Stool, Change in Bowel Habits, Chronic diarrhea, Difficulty Swallowing, Excessive gas, Gets full quickly at meals, Hemorrhoids, Indigestion, Nausea, Rectal Pain and Vomiting. Female Genitourinary Not Present- Frequency, Nocturia, Painful Urination, Pelvic Pain and Urgency. Musculoskeletal Present- Back Pain. Not Present- Joint Pain, Joint Stiffness, Muscle Pain, Muscle Weakness and Swelling of Extremities. Neurological Not Present- Decreased Memory, Fainting, Headaches, Numbness, Seizures, Tingling, Tremor, Trouble walking and Weakness. Psychiatric Not Present- Anxiety, Bipolar, Change in Sleep Pattern, Depression, Fearful and Frequent crying. Endocrine Present- Hot flashes. Not Present- Cold Intolerance, Excessive Hunger, Hair Changes, Heat Intolerance and New Diabetes. Hematology Not Present- Blood Thinners, Easy Bruising, Excessive bleeding,  Gland problems, HIV and Persistent Infections.  [employer](Rosa Rogers RMA; 12/30/2016 10:39 AM) 12/30/2016 10:39 AM Weight: 167 lb Height: 64.5in Body Surface Area: 1.82 m Body Mass Index: 28.22 kg/m  Temp.: 99.46F  Pulse: 93 (Regular)  P.OX: 96% (Room air) BP: 152/88 (Sitting, Left Arm, Standard)      Physical Exam [name]([name]  Kerman Passey MD; 12/30/2016 12:30 PM)  The physical exam findings are as follows: Note:WDWN in NAD Anterior scalp - midline, 1 cm above hairline - 1.5 cm protruding subcutaneous mass; no erythema or overlying skin changes Eyes: Pupils equal, round; sclera anicteric HENT: Oral mucosa moist; good dentition Neck: No masses palpated, no thyromegaly Lungs: CTA bilaterally; normal respiratory effort CV: Regular rate and rhythm; no murmurs; extremities well-perfused with no edema Abd: +bowel sounds, soft, non-tender, no palpable organomegaly; no palpable hernias Skin: Warm, dry; no sign of jaundice Psychiatric - alert and oriented x 4; calm mood and affect    Assessment & Plan [name] K. Ephram Kornegay MD; 12/30/2016 11:11 AM)  SEBACEOUS CYST (L72.3) Impression: scalp - 1.5 cm  Current Plans Schedule for Surgery - Excision of sebaceous cyst - scalp. The surgical procedure has been discussed with the patient. Potential risks, benefits, alternative treatments, and expected outcomes have been explained. All of the patient's questions at this time have been answered. The likelihood of reaching the patient's treatment goal is good. The patient understand the proposed surgical procedure and wishes to proceed.  Yolanda Gibbs. Corliss Skains, MD, [name]  General/ Trauma Surgery  12/30/2016 12:30 PM

## 2017-01-17 LAB — HM MAMMOGRAPHY: HM MAMMO: NORMAL (ref 0–4)

## 2017-02-11 ENCOUNTER — Encounter (HOSPITAL_BASED_OUTPATIENT_CLINIC_OR_DEPARTMENT_OTHER): Admission: RE | Payer: Self-pay | Source: Ambulatory Visit

## 2017-02-11 ENCOUNTER — Ambulatory Visit (HOSPITAL_BASED_OUTPATIENT_CLINIC_OR_DEPARTMENT_OTHER): Admission: RE | Admit: 2017-02-11 | Payer: BLUE CROSS/BLUE SHIELD | Source: Ambulatory Visit | Admitting: Surgery

## 2017-02-11 SURGERY — CYST REMOVAL
Anesthesia: General | Site: Scalp

## 2017-03-18 ENCOUNTER — Ambulatory Visit (INDEPENDENT_AMBULATORY_CARE_PROVIDER_SITE_OTHER): Payer: BLUE CROSS/BLUE SHIELD | Admitting: Medical

## 2017-03-18 ENCOUNTER — Encounter: Payer: Self-pay | Admitting: Medical

## 2017-03-18 VITALS — BP 147/80 | HR 85 | Temp 98.6°F | Resp 16 | Ht 65.0 in | Wt 168.2 lb

## 2017-03-18 DIAGNOSIS — R059 Cough, unspecified: Secondary | ICD-10-CM

## 2017-03-18 DIAGNOSIS — J029 Acute pharyngitis, unspecified: Secondary | ICD-10-CM | POA: Diagnosis not present

## 2017-03-18 DIAGNOSIS — H938X3 Other specified disorders of ear, bilateral: Secondary | ICD-10-CM | POA: Diagnosis not present

## 2017-03-18 DIAGNOSIS — H9203 Otalgia, bilateral: Secondary | ICD-10-CM

## 2017-03-18 DIAGNOSIS — J3489 Other specified disorders of nose and nasal sinuses: Secondary | ICD-10-CM | POA: Diagnosis not present

## 2017-03-18 DIAGNOSIS — R05 Cough: Secondary | ICD-10-CM

## 2017-03-18 DIAGNOSIS — J301 Allergic rhinitis due to pollen: Secondary | ICD-10-CM

## 2017-03-18 MED ORDER — AMOXICILLIN-POT CLAVULANATE 875-125 MG PO TABS: 1.0000 | ORAL_TABLET | Freq: Two times a day (BID) | ORAL | 0 refills | Status: DC

## 2017-03-18 MED ORDER — AZELASTINE HCL 0.1 % NA SOLN: 2.0000 | Freq: Two times a day (BID) | NASAL | 12 refills | Status: DC

## 2017-03-18 NOTE — Patient Instructions (Signed)
Your strep test was negative. However, your physical exam and clinical presentation is suspicious for strep and it is important to note that rapid strep test can be falsely negative. So I am going to give you augmentin  antibiotic today based on your exam and clinical presentation.  For sinus pressure left side early today and rt TM mid red today will rx augmentin as well. Augmentin has coverage for sinus infection and ear infection.  For ear pressure and allergy history continue nasonex and adding nasal spray astelin.  Rest hydrate, tylenol for fever, and warm salt water gargles.   Will send out throat culture today as well  Follow up in 7 days or as needed.

## 2017-03-18 NOTE — Addendum Note (Signed)
Addended by: Harley AltoPRICE, KRISTY M on: 03/18/2017 01:58 PM   Modules accepted: Orders

## 2017-03-18 NOTE — Progress Notes (Signed)
Subjective:    Patient ID: Yolanda Gibbs, female    DOB: 1950/11/11, 66 y.o.   MRN: [id number]  HPI  Pt in with some ear pain recently off and on for couple of month. Off and on nasal congestion for years. She has chronic pnd. Pt is on nasonex. She never tried astelin. Has hx of allergies.  This past Thursday woke up with terrible sore throat. Pain even swallowing her saliva or water. Throat is less painful now but still hurts. Some fatigue since then. Some achiness as well.  Some sweats but no obvious fever or chills. No know contacts with strep. But deals a lot with public at work. Pt has one grandchild 64 years old.    Review of Systems  Constitutional: Negative for chills, fatigue and fever.  HENT: Positive for congestion, ear pain, postnasal drip, sinus pain, sinus pressure and sore throat. Negative for ear discharge.   Respiratory: Positive for cough. Negative for chest tightness, shortness of breath and wheezing.   Cardiovascular: Negative for chest pain and palpitations.  Gastrointestinal: Negative for abdominal pain, constipation, diarrhea and nausea.  Musculoskeletal: Negative for back pain.  Skin: Negative for rash.  Neurological: Negative for dizziness, seizures, numbness and headaches.  Hematological: Negative for adenopathy. Does not bruise/bleed easily.  Psychiatric/Behavioral: Negative for behavioral problems and confusion.    Past Medical History:  Diagnosis Date  . Allergy    seasonal  . Depression   . GERD (gastroesophageal reflux disease)   . History of chicken pox   . History of colon polyps    benign     Social History   Social History  . Marital status: Married    Spouse name: N/A  . Number of children: N/A  . Years of education: N/A   Occupational History  . Not on file.   Social History Main Topics  . Smoking status: Never Smoker  . Smokeless tobacco: Never Used  . Alcohol use 1.8 - 2.4 oz/week    3 - 4 Standard drinks or equivalent per  week  . Drug use: No  . Sexual activity: Not on file   Other Topics Concern  . 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   Completed college   Enjoys watching old movies, needlework, cleaning her house   No pets.     No past surgical history on file.  Family History  Problem Relation Age of Onset  . Arthritis Mother   . CAD Mother   . AAA (abdominal aortic aneurysm) Mother   . Cancer Mother        lung  . Stroke Father   . Cancer Maternal Aunt        breast  . Arthritis Maternal Grandmother   . Cancer Maternal Grandfather        prostate  . Depression Paternal Grandmother     Allergies  Allergen Reactions  . Darvon [Propoxyphene] Other (See Comments)    "Slept for 3 days"  . Duloxetine     [facility]- patient is unsure     Current Outpatient Prescriptions on File Prior to Visit  Medication Sig Dispense Refill  . Calcium-Phosphorus-Vitamin D (CITRACAL +D3 PO) Take 1-2 tablets  by mouth 2 (two) times daily. Vit d 500 IU, calcium 630mg  each    . cetirizine (ZYRTEC) 10 MG tablet Take 10 mg by mouth daily.    Tery Sanfilippo. Docusate Sodium 100 MG capsule Take 100 mg by mouth as needed.    Marland Kitchen. esomeprazole (NEXIUM) 40 MG capsule Take 1 capsule (40 mg total) by mouth daily. 30 capsule 3  . gabapentin (NEURONTIN) 300 MG capsule Take 300 mg by mouth 3 (three) times daily. Reported on 12/08/2015    . mometasone (NASONEX) 50 MCG/ACT nasal spray Place 1 spray into the nose daily as needed.     . Multiple Vitamin (MULTIVITAMIN) tablet Take 1 tablet by mouth daily. [employer] Women's Ultra Mega    . ranitidine (ZANTAC) 300 MG tablet Take 1 tablet (300 mg total) by mouth at bedtime. 30 tablet 5  . venlafaxine XR (EFFEXOR-XR) 75 MG 24 hr capsule TAKE ONE  CAPSULE BY MOUTH DAILY WITH BREAKFAST 30 capsule 5   No current facility-administered medications on file prior to visit.     BP (!) 147/80   Pulse 85   Temp 98.6 F (37 C) (Oral)   Resp 16   Ht 5\' 5"  (1.651 m)   Wt 168 lb 3.2 oz (76.3 kg)   LMP 08/19/2004   SpO2 97%   BMI 27.99 kg/m       Objective:   Physical Exam  General  Mental Status - Alert. General Appearance - Well groomed. Not in acute distress.  Skin Rashes- No Rashes.  HEENT Head- Normal. Ear Auditory Canal - Left- Normal. Right - Normal.Tympanic Membrane- Left- Normal. Right- mild dull pinkish red tm.  Eye Sclera/Conjunctiva- Left- Normal. Right- Normal. Nose & Sinuses Nasal Mucosa- Left-  Boggy and Congested. Right-  Boggy and  Congested.Bilateral no  maxillary and  No frontal sinus pressure. Mouth & Throat Lips: Upper Lip- Normal: no dryness, cracking, pallor, cyanosis, or vesicular eruption. Lower Lip-Normal: no dryness, cracking, pallor, cyanosis or vesicular eruption. Buccal Mucosa- Bilateral- No Aphthous ulcers. Oropharynx- No Discharge or Erythema. Tonsils: Characteristics- Bilateral- moderate  Erythema or Congestion. Size/Enlargement- Bilateral- 1+ enlargement. Discharge- bilateral-None.  Neck Neck- Supple. No Masses.   Chest and Lung Exam Auscultation: Breath Sounds:-Clear even and unlabored.  Cardiovascular Auscultation:Rythm- Regular, rate and rhythm. Murmurs & Other Heart Sounds:Ausculatation of the heart reveal- No Murmurs.  Lymphatic Head & Neck General Head & Neck Lymphatics: Bilateral: Description- No Localized lymphadenopathy.       Assessment & Plan:  Your strep test was negative. However, your physical exam and clinical presentation is suspicious for strep and it is important to note that rapid strep test can be falsely negative. So I am going to give you augmentin  antibiotic today based on your exam and clinical presentation.  For sinus pressure left side early today  and rt TM mid red today will rx augmentin as well. Augmentin has coverage for sinus infection and ear infection.  For ear pressure and allergy history continue nasonex and adding nasal spray astelin.  Rest hydrate, tylenol for fever, and warm salt water gargles.   Will send out throat culture today as well  Follow up in 7 days or as needed.

## 2017-03-20 ENCOUNTER — Telehealth: Payer: Self-pay | Admitting: Medical

## 2017-03-20 ENCOUNTER — Other Ambulatory Visit: Payer: Self-pay

## 2017-03-20 LAB — CULTURE, GROUP A STREP

## 2017-03-20 MED ORDER — AZITHROMYCIN 250 MG PO TABS: ORAL_TABLET | ORAL | 0 refills | Status: DC

## 2017-03-20 NOTE — Telephone Encounter (Signed)
rx azithromycin sent to pharmacy 

## 2017-03-26 ENCOUNTER — Other Ambulatory Visit: Payer: Self-pay | Admitting: Family

## 2017-04-06 ENCOUNTER — Other Ambulatory Visit: Payer: Self-pay | Admitting: Family

## 2017-04-07 ENCOUNTER — Ambulatory Visit (INDEPENDENT_AMBULATORY_CARE_PROVIDER_SITE_OTHER): Payer: BLUE CROSS/BLUE SHIELD | Admitting: Family

## 2017-04-07 ENCOUNTER — Encounter: Payer: Self-pay | Admitting: Family

## 2017-04-07 VITALS — BP 148/82 | HR 72 | Temp 98.1°F | Resp 16 | Ht 65.0 in | Wt 167.0 lb

## 2017-04-07 DIAGNOSIS — J019 Acute sinusitis, unspecified: Secondary | ICD-10-CM | POA: Diagnosis not present

## 2017-04-07 MED ORDER — MOMETASONE FUROATE 50 MCG/ACT NA SUSP: 1.0000 | Freq: Every day | NASAL | Status: DC | PRN

## 2017-04-07 MED ORDER — DOXYCYCLINE HYCLATE 100 MG PO TABS: 100.0000 mg | ORAL_TABLET | Freq: Two times a day (BID) | ORAL | 0 refills | Status: DC

## 2017-04-07 NOTE — Progress Notes (Signed)
Subjective:    Patient ID: Yolanda Gibbs, female    DOB: 1951-01-07, 66 y.o.   MRN: [id number]  HPI  Yolanda Gibbs is a 66 yr old female who presents today with chief complaint of bilateral ear pain R>L. Pain has been present x  >2 weeks. Symptoms began with URI symptoms have resolved except for the otalgia.  No alleviating factors.  Reports that she was treated with augmentin 03/18/17 it made her sleepy so she was changed to zpak. Symptoms improved briefly then returned.   Review of Systems    see HPI  Past Medical History:  Diagnosis Date  . Allergy    seasonal  . Depression   . GERD (gastroesophageal reflux disease)   . History of chicken pox   . History of colon polyps    benign     Social History   Social History  . Marital status: Married    Spouse name: N/A  . Number of children: N/A  . Years of education: N/A   Occupational History  . Not on file.   Social History Main Topics  . Smoking status: Never Smoker  . Smokeless tobacco: Never Used  . Alcohol use 1.8 - 2.4 oz/week    3 - 4 Standard drinks or equivalent per week  . Drug use: No  . Sexual activity: Not on file   Other Topics Concern  . Not on file   Social History Narrative   Daughter at [employer] (Julian Hy) born 85   Daughter Denny Peon 61   Husband is a Curator, has a Forensic scientist, has a Landscape architect helps with day to day of the business   Second marriage- prior to remarrying worked in Passenger transport manager.   Completed college   Enjoys watching old movies, needlework, cleaning her house   No pets.     No past surgical history on file.  Family History  Problem Relation Age of Onset  . Arthritis Mother   . CAD Mother   . AAA (abdominal aortic aneurysm) Mother   . Cancer Mother        lung  . Stroke Father   . Cancer Maternal Aunt        breast  . Arthritis Maternal Grandmother   . Cancer Maternal Grandfather        prostate  . Depression Paternal Grandmother     Allergies    Allergen Reactions  . Darvon [Propoxyphene] Other (See Comments)    "Slept for 3 days"  . Duloxetine     Added from [facility]- patient is unsure     Current Outpatient Prescriptions on File Prior to Visit  Medication Sig Dispense Refill  . azelastine (ASTELIN) 0.1 % nasal spray Place 2 sprays into both nostrils 2 (two) times daily. Use in each nostril as directed 30 mL 12  . Calcium-Phosphorus-Vitamin D (CITRACAL +D3 PO) Take 1-2 tablets by mouth 2 (two) times daily. Vit d 500 IU, calcium 630mg  each    . cetirizine (ZYRTEC) 10 MG tablet Take 10 mg by mouth daily.    Tery Sanfilippo Sodium 100 MG capsule Take 100 mg by mouth as needed.    Marland Kitchen esomeprazole (NEXIUM) 40 MG capsule Take 1 capsule (40 mg total) by mouth daily. 30 capsule 3  . Multiple Vitamin (MULTIVITAMIN) tablet Take 1 tablet by mouth daily. [employer] Women's Ultra Mega    . ranitidine (ZANTAC) 300 MG tablet Take 1 tablet (300 mg total) by mouth at bedtime. 30 tablet  5  . venlafaxine XR (EFFEXOR-XR) 75 MG 24 hr capsule Take 1 capsule (75 mg total) by mouth daily with breakfast. 30 capsule 4   No current facility-administered medications on file prior to visit.     BP (!) 148/82 (BP Location: Right Arm, Cuff Size: Normal)   Pulse 72   Temp 98.1 F (36.7 C) (Oral)   Resp 16   Ht 5\' 5"  (1.651 m)   Wt 167 lb (75.8 kg)   LMP 08/19/2004   SpO2 97%   BMI 27.79 kg/m    Objective:   Physical Exam  Constitutional: She is oriented to person, place, and time. She appears well-developed and well-nourished.  HENT:  Head: Normocephalic and atraumatic.  Right Ear: Tympanic membrane and ear canal normal.  Left Ear: Tympanic membrane and ear canal normal.  Nose: Right sinus exhibits maxillary sinus tenderness. Right sinus exhibits no frontal sinus tenderness. Left sinus exhibits maxillary sinus tenderness. Left sinus exhibits no frontal sinus tenderness.  Cardiovascular: Normal rate, regular rhythm and normal heart sounds.   No murmur  heard. Pulmonary/Chest: Effort normal and breath sounds normal. No respiratory distress. She has no wheezes.  Neurological: She is alert and oriented to person, place, and time.  Psychiatric: She has a normal mood and affect. Her behavior is normal. Judgment and thought content normal.          Assessment & Plan:  Sinusitis- Advised pt as follows:  Please restart nasonex. Continue zyrtec.  Start doxycycline for sinus infection. Add a probiotic twice daily while on antibiotics.  Call if new/worsening symptoms or if not improved in 1 week.

## 2017-04-07 NOTE — Patient Instructions (Signed)
Please restart nasonex. Continue zyrtec.  Start doxycycline for sinus infection. Add a probiotic twice daily while on antibiotics.  Call if new/worsening symptoms or if not improved in 1 week.

## 2017-05-07 ENCOUNTER — Other Ambulatory Visit: Payer: Self-pay | Admitting: Family

## 2017-06-02 ENCOUNTER — Other Ambulatory Visit: Payer: Self-pay | Admitting: Family

## 2017-06-02 NOTE — Telephone Encounter (Signed)
Rx approved and sent to the pharmacy by e-script.//AB/CMA 

## 2017-06-16 ENCOUNTER — Ambulatory Visit: Payer: BLUE CROSS/BLUE SHIELD | Admitting: Family

## 2017-06-23 ENCOUNTER — Encounter: Payer: Self-pay | Admitting: Family

## 2017-06-23 ENCOUNTER — Ambulatory Visit: Payer: BLUE CROSS/BLUE SHIELD | Admitting: Family

## 2017-06-23 VITALS — BP 145/82 | HR 79 | Temp 98.7°F | Resp 16 | Ht 65.0 in | Wt 164.2 lb

## 2017-06-23 DIAGNOSIS — Z23 Encounter for immunization: Secondary | ICD-10-CM

## 2017-06-23 DIAGNOSIS — J309 Allergic rhinitis, unspecified: Secondary | ICD-10-CM

## 2017-06-23 DIAGNOSIS — K219 Gastro-esophageal reflux disease without esophagitis: Secondary | ICD-10-CM | POA: Diagnosis not present

## 2017-06-23 DIAGNOSIS — R232 Flushing: Secondary | ICD-10-CM | POA: Diagnosis not present

## 2017-06-23 NOTE — Assessment & Plan Note (Signed)
Stable. Continue effexor. She feels that she does not wish to pursue HRT and can live with current symptoms. Has tried gabapentin in the past but it made her sleepy.

## 2017-06-23 NOTE — Assessment & Plan Note (Signed)
Uncontrolled.  Advised pt as follows:  Start Xyzal once daily for allergies. Restart the nasonex 2 sprays each nostril one daily.  Restart Astelin nasal spray 2 sprays twice daily.  Call if symptoms worsen or fail to improve.

## 2017-06-23 NOTE — Assessment & Plan Note (Signed)
Stable. Will d/c zantac, continue nexium and GERD diet.

## 2017-06-23 NOTE — Progress Notes (Signed)
Subjective:    Patient ID: Iran Planas, female    DOB: 06-12-1951, 66 y.o.   MRN: [id number]  HPI  Ms. Fruin is a 66 yr old female who presents today for follow up.  GERD-reports occasional gerd symptoms.   Hot flashes- happen mid day. This has been occurring since menopause.  Do not occur at night.   Has chronic nasal drainage.  Has multiple allergies (tree pollen) grass.  She notes some improvement with alkaselzer cold.  Has tried singulair in the past without improvement. Not currently taking an antihistamine. Did allergy shots about 10 years ago without significant improvement. Has cough due to chronic "drainage."  Denies fever.    Review of Systems See HPI  Past Medical History:  Diagnosis Date  . Allergy    seasonal  . Depression   . GERD (gastroesophageal reflux disease)   . History of chicken pox   . History of colon polyps    benign     Social History   Socioeconomic History  . Marital status: Married    Spouse name: Not on file  . Number of children: Not on file  . Years of education: Not on file  . Highest education level: Not on file  Social Needs  . Financial resource strain: Not on file  . Food insecurity - worry: Not on file  . Food insecurity - inability: Not on file  . Transportation needs - medical: Not on file  . Transportation needs - non-medical: Not on file  Occupational History  . Not on file  Tobacco Use  . Smoking status: Never Smoker  . Smokeless tobacco: Never Used  Substance and Sexual Activity  . Alcohol use: Yes    Alcohol/week: 1.8 - 2.4 oz    Types: 3 - 4 Standard drinks or equivalent per week  . Drug use: No  . Sexual activity: Not on file  Other Topics Concern  . Not on file  Social History Narrative   Daughter at [employer] (Julian Hy) born 51   Daughter Denny Peon 33   Husband is a Curator, has a Forensic scientist, has a Landscape architect helps with day to day of the business   Second marriage- prior to remarrying worked in Advertising account executive.   Completed college   Enjoys watching old movies, needlework, cleaning her house   No pets.     No past surgical history on file.  Family History  Problem Relation Age of Onset  . Arthritis Mother   . CAD Mother   . AAA (abdominal aortic aneurysm) Mother   . Cancer Mother        lung  . Stroke Father   . Cancer Maternal Aunt        breast  . Arthritis Maternal Grandmother   . Cancer Maternal Grandfather        prostate  . Depression Paternal Grandmother     Allergies  Allergen Reactions  . Darvon [Propoxyphene] Other (See Comments)    "Slept for 3 days"  . Duloxetine     Added from [facility]- patient is unsure     Current Outpatient Medications on File Prior to Visit  Medication Sig Dispense Refill  . azelastine (ASTELIN) 0.1 % nasal spray Place 2 sprays into both nostrils 2 (two) times daily. Use in each nostril as directed 30 mL 12  . Calcium-Phosphorus-Vitamin D (CITRACAL +D3 PO) Take 1-2 tablets by mouth 2 (two) times daily. Vit d 500 IU, calcium 630mg  each    .  cetirizine (ZYRTEC) 10 MG tablet Take 10 mg by mouth daily.    Tery Sanfilippo. Docusate Sodium 100 MG capsule Take 100 mg by mouth as needed.    Marland Kitchen. esomeprazole (NEXIUM) 40 MG capsule TAKE 1 CAPSULE(40 MG) BY MOUTH DAILY 30 capsule 5  . mometasone (NASONEX) 50 MCG/ACT nasal spray Place 1 spray into the nose daily as needed. 17 g   . Multiple Vitamin (MULTIVITAMIN) tablet Take 1 tablet by mouth daily. [employer] Women's Ultra Mega    . Multiple Vitamins-Minerals (THRIVE FOR LIFE WOMENS) TABS Take 1 capsule by mouth daily. 1 capsule, 1 shake and 1 patch daily.    . ranitidine (ZANTAC) 300 MG tablet TAKE 1 TABLET(300 MG) BY MOUTH AT BEDTIME 30 tablet 5  . venlafaxine XR (EFFEXOR-XR) 75 MG 24 hr capsule Take 1 capsule (75 mg total) by mouth daily with breakfast. 30 capsule 4   No current facility-administered medications on file prior to visit.     BP (!) 145/82 (BP Location: Left Arm, Cuff Size: Normal)    Pulse 79   Temp 98.7 F (37.1 C) (Oral)   Resp 16   Ht 5\' 5"  (1.651 m)   Wt 164 lb 3.2 oz (74.5 kg)   LMP 08/19/2004   SpO2 99%   BMI 27.32 kg/m       Objective:   Physical Exam  Constitutional: She appears well-developed and well-nourished.  HENT:  Head: Normocephalic and atraumatic.  Left Ear: Tympanic membrane and ear canal normal.  Mild serous effusion R TM without bulging or erythema.   Cardiovascular: Normal rate, regular rhythm and normal heart sounds.  No murmur heard. Pulmonary/Chest: Effort normal and breath sounds normal. No respiratory distress. She has no wheezes.  Psychiatric: She has a normal mood and affect. Her behavior is normal. Judgment and thought content normal.          Assessment & Plan:

## 2017-06-23 NOTE — Patient Instructions (Addendum)
Start Xyzal once daily for allergies. Restart the nasonex 2 sprays each nostril one daily.  Restart Astelin nasal spray 2 sprays twice daily.  Stop zantac. Continue nexium. Call if symptoms worsen or fail to improve.

## 2017-07-17 ENCOUNTER — Other Ambulatory Visit: Payer: Self-pay | Admitting: Family

## 2017-08-26 ENCOUNTER — Other Ambulatory Visit: Payer: Self-pay | Admitting: Family

## 2017-09-22 ENCOUNTER — Ambulatory Visit: Payer: BLUE CROSS/BLUE SHIELD | Admitting: Family

## 2017-09-22 ENCOUNTER — Encounter: Payer: Self-pay | Admitting: Family

## 2017-09-22 VITALS — BP 150/77 | HR 97 | Temp 98.5°F | Resp 16 | Ht 65.0 in | Wt 164.4 lb

## 2017-09-22 DIAGNOSIS — F329 Major depressive disorder, single episode, unspecified: Secondary | ICD-10-CM

## 2017-09-22 DIAGNOSIS — F32A Depression, unspecified: Secondary | ICD-10-CM

## 2017-09-22 DIAGNOSIS — J069 Acute upper respiratory infection, unspecified: Secondary | ICD-10-CM

## 2017-09-22 DIAGNOSIS — R030 Elevated blood-pressure reading, without diagnosis of hypertension: Secondary | ICD-10-CM | POA: Diagnosis not present

## 2017-09-22 DIAGNOSIS — K219 Gastro-esophageal reflux disease without esophagitis: Secondary | ICD-10-CM | POA: Diagnosis not present

## 2017-09-22 NOTE — Progress Notes (Signed)
Subjective:    Patient ID: Yolanda Gibbs, female    DOB: 1950-11-30, 67 y.o.   MRN: [id number]  HPI  Patient is a 67 yr old female who presents today for follow up.  GERD-reports that reflux is generally stable on PPI.  Continues nexium once daily   Started with sore throat on Thursday, Friday "really sore." Slept all day Saturday. Yesterday and today slept off/on.  She reports some sinus pressure. Did not have myalgias or fever- just  fatigue. Continues nasonex and astelin. Has not needed zyrtec.   Depression- reports that depression is stable on effexor.   BP Readings from Last 3 Encounters:  09/22/17 (!) 150/77  06/23/17 (!) 145/82  04/07/17 (!) 148/82      Review of Systems    see HPI  Past Medical History:  Diagnosis Date  . Allergy    seasonal  . Depression   . GERD (gastroesophageal reflux disease)   . History of chicken pox   . History of colon polyps    benign     Social History   Socioeconomic History  . Marital status: Married    Spouse name: Not on file  . Number of children: Not on file  . Years of education: Not on file  . Highest education level: Not on file  Social Needs  . Financial resource strain: Not on file  . Food insecurity - worry: Not on file  . Food insecurity - inability: Not on file  . Transportation needs - medical: Not on file  . Transportation needs - non-medical: Not on file  Occupational History  . Not on file  Tobacco Use  . Smoking status: Never Smoker  . Smokeless tobacco: Never Used  Substance and Sexual Activity  . Alcohol use: Yes    Alcohol/week: 1.8 - 2.4 oz    Types: 3 - 4 Standard drinks or equivalent per week  . Drug use: No  . Sexual activity: Not on file  Other Topics Concern  . 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   Completed college   Enjoys watching old movies, needlework, cleaning her house   No pets.     No past surgical history on file.  Family History  Problem Relation Age of Onset  . Arthritis Mother   . CAD Mother   . AAA (abdominal aortic aneurysm) Mother   . Cancer Mother        lung  . Stroke Father   . Cancer Maternal Aunt        breast  . Arthritis Maternal Grandmother   . Cancer Maternal Grandfather        prostate  . Depression Paternal Grandmother     Allergies  Allergen Reactions  . Darvon [Propoxyphene] Other (See Comments)    "Slept for 3 days"  . Duloxetine     [facility]- patient is unsure     Current Outpatient Medications on File Prior to Visit  Medication Sig Dispense Refill  . azelastine (ASTELIN) 0.1 % nasal spray Place 2 sprays into both nostrils 2 (two) times daily. Use in each nostril as directed 30 mL 12  . Calcium-Phosphorus-Vitamin D (CITRACAL +D3 PO) Take 1-2 tablets  by mouth 2 (two) times daily. Vit d 500 IU, calcium 630mg  each    . cetirizine (ZYRTEC) 10 MG tablet Take 10 mg by mouth daily.    Tery Sanfilippo Sodium 100 MG capsule Take 100 mg by mouth as needed.    Marland Kitchen esomeprazole (NEXIUM) 40 MG capsule TAKE 1 CAPSULE(40 MG) BY MOUTH DAILY 30 capsule 5  . mometasone (NASONEX) 50 MCG/ACT nasal spray Place 1 spray into the nose daily as needed. 17 g   . Multiple Vitamin (MULTIVITAMIN) tablet Take 1 tablet by mouth daily. [employer] Women's Ultra Mega    . Multiple Vitamins-Minerals (THRIVE FOR LIFE WOMENS) TABS Take 1 capsule by mouth daily. 1 capsule, 1 shake and 1 patch daily.    Marland Kitchen venlafaxine XR (EFFEXOR-XR) 75 MG 24 hr capsule TAKE 1 CAPSULE(75 MG) BY MOUTH DAILY WITH BREAKFAST 30 capsule 5   No current facility-administered medications on file prior to visit.     BP (!) 150/77 (BP Location: Right Arm, Patient Position: Sitting, Cuff Size: Large)   Pulse 97   Temp 98.5 F (36.9 C) (Oral)    Resp 16   Ht 5\' 5"  (1.651 m)   Wt 164 lb 6.4 oz (74.6 kg)   LMP 08/19/2004   SpO2 98%   BMI 27.36 kg/m    Objective:   Physical Exam  Constitutional: She is oriented to person, place, and time. She appears well-developed and well-nourished.  HENT:  Head: Normocephalic and atraumatic.  Right Ear: Ear canal normal.  Left Ear: Ear canal normal.  Nose: Right sinus exhibits no maxillary sinus tenderness and no frontal sinus tenderness. Left sinus exhibits no maxillary sinus tenderness and no frontal sinus tenderness.  Mouth/Throat: No oropharyngeal exudate, posterior oropharyngeal edema or posterior oropharyngeal erythema.  Bilateral serous effusions without bulging or erythema  Neck: Neck supple.  Cardiovascular: Normal rate, regular rhythm and normal heart sounds.  No murmur heard. Pulmonary/Chest: Effort normal and breath sounds normal. No respiratory distress. She has no wheezes.  Lymphadenopathy:    She has no cervical adenopathy.  Neurological: She is alert and oriented to person, place, and time.  Psychiatric: She has a normal mood and affect. Her behavior is normal. Judgment and thought content normal.          Assessment & Plan:  Viral URI- symptoms most consistent with a resolving viral URI.  Advised pt as follows:  Continue your nasal sprays and add back in zyrtec once daily (to help with sinus and ear congestion) Call if new/worsening symptoms or if not improved in 3-4 days. Pt verbalizes understanding.   GERD- stable on PPI, continue same.  Elevated blood pressure- we discussed borderline elevated BP and need to continue to work on a low sodium diet, exercise. Plan to repeat bp in 3 months, if still elevated, consider treatment.  Depression- stable on current dose of effexor, continue same.

## 2017-09-22 NOTE — Patient Instructions (Signed)
Continue your nasal sprays and add back in zyrtec once daily. Call if new/worsening symptoms or if not improved in 3-4 days.

## 2017-11-21 ENCOUNTER — Other Ambulatory Visit: Payer: Self-pay | Admitting: Family

## 2017-12-19 ENCOUNTER — Other Ambulatory Visit: Payer: Self-pay | Admitting: Family

## 2017-12-22 ENCOUNTER — Encounter: Payer: Self-pay | Admitting: Family

## 2017-12-22 ENCOUNTER — Ambulatory Visit: Payer: BLUE CROSS/BLUE SHIELD | Admitting: Family

## 2017-12-22 VITALS — BP 139/75 | HR 70 | Temp 97.7°F | Resp 16 | Ht 65.0 in | Wt 163.4 lb

## 2017-12-22 DIAGNOSIS — K219 Gastro-esophageal reflux disease without esophagitis: Secondary | ICD-10-CM | POA: Diagnosis not present

## 2017-12-22 DIAGNOSIS — F329 Major depressive disorder, single episode, unspecified: Secondary | ICD-10-CM

## 2017-12-22 DIAGNOSIS — J309 Allergic rhinitis, unspecified: Secondary | ICD-10-CM

## 2017-12-22 DIAGNOSIS — F32A Depression, unspecified: Secondary | ICD-10-CM

## 2017-12-22 NOTE — Patient Instructions (Signed)
Please add claritin  once daily. Restart flonase 2 sprays each nostril once daily to help with your ear pain.

## 2017-12-22 NOTE — Progress Notes (Signed)
Subjective:    Patient ID: Yolanda Gibbs, female    DOB: 26-Mar-1951, 68 y.o.   MRN: [id number]  HPI  Patient is a 67 yr old female who presents today for routine follow up.   GERD- maintained on nexium.  Reports she has occasion burning in her throat first thing in the AM. She has associate cough when this occurs.    Allergic rhinitis-  Uses nasonex prn and astelin daily which she feels helps her the most.    Depression- maintained on effexor. Reports mood is good.   Reports that she has some intermittent bilateral ear pain.  Not always associated with sinus pressure.   Review of Systems  Past Medical History:  Diagnosis Date  . Allergy    seasonal  . Depression   . GERD (gastroesophageal reflux disease)   . History of chicken pox   . History of colon polyps    benign     Social History   Socioeconomic History  . Marital status: Married    Spouse name: Not on file  . Number of children: Not on file  . Years of education: Not on file  . Highest education level: Not on file  Occupational History  . Not on file  Social Needs  . Financial resource strain: Not on file  . Food insecurity:    Worry: Not on file    Inability: Not on file  . Transportation needs:    Medical: Not on file    Non-medical: Not on file  Tobacco Use  . Smoking status: Never Smoker  . Smokeless tobacco: Never Used  Substance and Sexual Activity  . Alcohol use: Yes    Alcohol/week: 1.8 - 2.4 oz    Types: 3 - 4 Standard drinks or equivalent per week  . Drug use: No  . Sexual activity: Not on file  Lifestyle  . Physical activity:    Days per week: Not on file    Minutes per session: Not on file  . Stress: Not on file  Relationships  . Social connections:    Talks on phone: Not on file    Gets together: Not on file    Attends religious service: Not on file    Active member of club or organization: Not on file    Attends meetings of clubs or organizations: Not on file    Relationship  status: Not on file  . Intimate partner violence:    Fear of current or ex partner: Not on file    Emotionally abused: Not on file    Physically abused: Not on file    Forced sexual activity: Not on file  Other Topics Concern  . 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   Completed college   Enjoys watching old movies, needlework, cleaning her house   No pets.     No past surgical history on file.  Family History  Problem Relation Age of Onset  . Arthritis Mother   . CAD Mother   . AAA (abdominal aortic aneurysm) Mother   . Cancer Mother        lung  . Stroke Father   . Cancer Maternal Aunt  breast  . Arthritis Maternal Grandmother   . Cancer Maternal Grandfather        prostate  . Depression Paternal Grandmother     Allergies  Allergen Reactions  . Darvon [Propoxyphene] Other (See Comments)    "Slept for 3 days"  . Duloxetine     Added from [facility]- patient is unsure     Current Outpatient Medications on File Prior to Visit  Medication Sig Dispense Refill  . azelastine (ASTELIN) 0.1 % nasal spray Place 2 sprays into both nostrils 2 (two) times daily. Use in each nostril as directed 30 mL 12  . cetirizine (ZYRTEC) 10 MG tablet Take 10 mg by mouth daily.    Tery Sanfilippo Sodium 100 MG capsule Take 100 mg by mouth as needed.    Marland Kitchen esomeprazole (NEXIUM) 40 MG capsule TAKE 1 CAPSULE(40 MG) BY MOUTH DAILY 30 capsule 5  . mometasone (NASONEX) 50 MCG/ACT nasal spray Place 1 spray into the nose daily as needed. 17 g   . Multiple Vitamins-Minerals (THRIVE FOR LIFE WOMENS) TABS Take 1 capsule by mouth daily. 1 capsule, 1 shake and 1 patch daily.    Marland Kitchen venlafaxine XR (EFFEXOR-XR) 75 MG 24 hr capsule TAKE 1 CAPSULE(75 MG) BY MOUTH DAILY  WITH BREAKFAST 30 capsule 5  . Calcium-Phosphorus-Vitamin D (CITRACAL +D3 PO) Take 1-2 tablets by mouth 2 (two) times daily. Vit d 500 IU, calcium  each     No current facility-administered medications on file prior to visit.     BP 139/75 (BP Location: Right Arm, Cuff Size: Normal)   Pulse 70   Temp 97.7 F (36.5 C) (Oral)   Resp 16   Ht  (1.651 m)   Wt 163 lb 6.4 oz (74.1 kg)   LMP 08/19/2004   SpO2 99%   BMI 27.19 kg/m       Objective:   Physical Exam  Constitutional: She appears well-developed and well-nourished.  HENT:  Bilateral serous effusions without erythema noted  Cardiovascular: Normal rate, regular rhythm and normal heart sounds.  No murmur heard. Pulmonary/Chest: Effort normal and breath sounds normal. No respiratory distress. She has no wheezes.  Psychiatric: She has a normal mood and affect. Her behavior is normal. Judgment and thought content normal.          Assessment & Plan:  Depression- stable on effexor.  Allergic rhinitis- suspect some mild eustachian tube dysfunction, will have her add claritin and restart daily flonase.  GERD- overall stable on PPI, continue same.

## 2018-03-14 ENCOUNTER — Telehealth: Payer: Self-pay | Admitting: Family

## 2018-03-16 ENCOUNTER — Other Ambulatory Visit: Payer: Self-pay | Admitting: Family

## 2018-03-16 NOTE — Telephone Encounter (Signed)
30 day supply of venlafaxine sent to pharmacy. Pt was due for a welcome to medicare visit with [name] in August.  Please call pt to schedule appt. Thanks!

## 2018-03-17 NOTE — Telephone Encounter (Signed)
Called pt and spoke to her husband. Left a message with him to have her call back and schedule a Medicare Wellness Visit.

## 2018-03-19 ENCOUNTER — Telehealth: Payer: Self-pay

## 2018-03-19 NOTE — Telephone Encounter (Signed)
Copied from CRM [phone]#[id number]. Topic: Medicare AWV >> Mar 19, 2018 10:31 AM Windy KalataMichael, Taylor L, NT wrote: Reason for CRM: patient is calling and requesting to schedule her medicare well visit. Please contact patient to schedule.

## 2018-05-11 ENCOUNTER — Encounter: Payer: Self-pay | Admitting: Family

## 2018-05-11 ENCOUNTER — Ambulatory Visit (INDEPENDENT_AMBULATORY_CARE_PROVIDER_SITE_OTHER): Payer: Medicare Other | Admitting: Family

## 2018-05-11 VITALS — BP 152/82 | HR 61 | Temp 98.0°F | Resp 18 | Ht 65.0 in | Wt 155.8 lb

## 2018-05-11 DIAGNOSIS — H6980 Other specified disorders of Eustachian tube, unspecified ear: Secondary | ICD-10-CM | POA: Diagnosis not present

## 2018-05-11 DIAGNOSIS — J309 Allergic rhinitis, unspecified: Secondary | ICD-10-CM | POA: Diagnosis not present

## 2018-05-11 DIAGNOSIS — Z23 Encounter for immunization: Secondary | ICD-10-CM | POA: Diagnosis not present

## 2018-05-11 MED ORDER — MONTELUKAST SODIUM 10 MG PO TABS: 10.0000 mg | ORAL_TABLET | Freq: Every day | ORAL | 3 refills | Status: DC

## 2018-05-11 MED ORDER — MOMETASONE FUROATE 50 MCG/ACT NA SUSP: 2.0000 | Freq: Every day | NASAL | 3 refills | Status: DC

## 2018-05-11 NOTE — Progress Notes (Signed)
Subjective:    Patient ID: Iran Planas, female    DOB: 1951/05/01, 67 y.o.   MRN: [id number]  HPI  Patient presents today with chief complaint of bilateral ear pain.  Reports that ear pain has been present for 2 months.  Reports drainage in throat, sore throat intermittently. Denies fever. Initially used alka seltzer cold which helped initially. No longer helping. Takes zyrtec daily.      Review of Systems See HPI  Past Medical History:  Diagnosis Date  . Allergy    seasonal  . Depression   . GERD (gastroesophageal reflux disease)   . History of chicken pox   . History of colon polyps    benign     Social History   Socioeconomic History  . Marital status: Married    Spouse name: Not on file  . Number of children: Not on file  . Years of education: Not on file  . Highest education level: Not on file  Occupational History  . Not on file  Social Needs  . Financial resource strain: Not on file  . Food insecurity:    Worry: Not on file    Inability: Not on file  . Transportation needs:    Medical: Not on file    Non-medical: Not on file  Tobacco Use  . Smoking status: Never Smoker  . Smokeless tobacco: Never Used  Substance and Sexual Activity  . Alcohol use: Yes    Alcohol/week: 3.0 - 4.0 standard drinks    Types: 3 - 4 Standard drinks or equivalent per week  . Drug use: No  . Sexual activity: Not on file  Lifestyle  . Physical activity:    Days per week: Not on file    Minutes per session: Not on file  . Stress: Not on file  Relationships  . Social connections:    Talks on phone: Not on file    Gets together: Not on file    Attends religious service: Not on file    Active member of club or organization: Not on file    Attends meetings of clubs or organizations: Not on file    Relationship status: Not on file  . Intimate partner violence:    Fear of current or ex partner: Not on file    Emotionally abused: Not on file    Physically abused: Not on  file    Forced sexual activity: Not on file  Other Topics Concern  . 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   Completed college   Enjoys watching old movies, needlework, cleaning her house   No pets.     No past surgical history on file.  Family History  Problem Relation Age of Onset  . Arthritis Mother   . CAD Mother   . AAA (abdominal aortic aneurysm) Mother   . Cancer Mother        lung  . Stroke Father   . Cancer Maternal Aunt        breast  . Arthritis Maternal Grandmother   . Cancer Maternal Grandfather        prostate  . Depression Paternal Grandmother     Allergies  Allergen Reactions  . Darvon [  Propoxyphene] Other (See Comments)    "Slept for 3 days"  . Duloxetine     [facility]- [facility] is unsure     Current Outpatient Medications on File Prior to Visit  Medication Sig Dispense Refill  . azelastine (ASTELIN) 0.1 % nasal spray Place 2 sprays into both nostrils 2 (two) times daily. Use in each nostril as directed 30 mL 12  . Calcium-Phosphorus-Vitamin D (CITRACAL +D3 PO) Take 1-2 tablets by mouth 2 (two) times daily. Vit d 500 IU, calcium 630mg  each    . cetirizine (ZYRTEC) 10 MG tablet Take 10 mg by mouth daily.    Tery Sanfilippo. Docusate Sodium 100 MG capsule Take 100 mg by mouth as needed.    Marland Kitchen. esomeprazole (NEXIUM) 40 MG capsule TAKE 1 CAPSULE(40 MG) BY MOUTH DAILY 30 capsule 5  . Multiple Vitamins-Minerals (THRIVE FOR LIFE WOMENS) TABS Take 1 capsule by mouth daily. 1 capsule, 1 shake and 1 patch daily.    Marland Kitchen. venlafaxine XR (EFFEXOR-XR) 75 MG 24 hr capsule TAKE ONE CAPSULE BY MOUTH DAILY WITH BREAKFAST 90 capsule 0   No current facility-administered medications on file prior to visit.     BP (!) 152/82 (BP Location:  Right Arm, Cuff Size: Normal)   Pulse 61   Temp 98 F (36.7 C) (Oral)   Resp 18   Ht 5\' 5"  (1.651 m)   Wt 155 lb 12.8 oz (70.7 kg)   LMP 08/19/2004   SpO2 98%   BMI 25.93 kg/m       Objective:   Physical Exam  Constitutional: She is oriented to person, place, and time. She appears well-developed and well-nourished.  HENT:  Mouth/Throat: No oropharyngeal exudate, posterior oropharyngeal edema or posterior oropharyngeal erythema.  Some clear fluid noted behind R TM without bulging, erythema  L TM normal  Neck: Neck supple.  Cardiovascular: Normal rate, regular rhythm and normal heart sounds.  No murmur heard. Pulmonary/Chest: Effort normal and breath sounds normal. No respiratory distress. She has no wheezes.  Lymphadenopathy:    She has no cervical adenopathy.  Neurological: She is alert and oriented to person, place, and time.  Skin: Skin is warm and dry.  Psychiatric: She has a normal mood and affect. Her behavior is normal. Judgment and thought content normal.          Assessment & Plan:  Allergic rhinitis/Eustachian tube dysfunction- pt is advised as follows:  Please begin singulair and flonase. Continue zyrtec. Call if symptoms worsen or if not improved in 1 week.   Flu shot today.

## 2018-05-11 NOTE — Patient Instructions (Addendum)
Please begin singulair and flonase. Call if symptoms worsen or if not improved in 1 week.

## 2018-06-15 LAB — HM COLONOSCOPY

## 2018-06-16 ENCOUNTER — Encounter: Payer: Self-pay | Admitting: Family

## 2018-06-23 ENCOUNTER — Encounter: Payer: Self-pay | Admitting: Family

## 2018-07-07 ENCOUNTER — Other Ambulatory Visit: Payer: Self-pay | Admitting: Family

## 2018-07-07 NOTE — Telephone Encounter (Signed)
[name] -- please review refill protocol failure and advise? 

## 2018-08-24 ENCOUNTER — Ambulatory Visit: Payer: Medicare Other | Admitting: Family

## 2020-12-31 ENCOUNTER — Emergency Department (HOSPITAL_BASED_OUTPATIENT_CLINIC_OR_DEPARTMENT_OTHER): Payer: Medicare Other

## 2020-12-31 ENCOUNTER — Inpatient Hospital Stay (HOSPITAL_BASED_OUTPATIENT_CLINIC_OR_DEPARTMENT_OTHER)
Admission: EM | Admit: 2020-12-31 | Discharge: 2021-01-02 | DRG: 041 | Disposition: A | Discharge status: Home / Self Care | Payer: Medicare Other | Attending: Family Medicine | Admitting: Family Medicine

## 2020-12-31 ENCOUNTER — Other Ambulatory Visit: Payer: Self-pay

## 2020-12-31 ENCOUNTER — Encounter (HOSPITAL_BASED_OUTPATIENT_CLINIC_OR_DEPARTMENT_OTHER): Payer: Self-pay | Admitting: Emergency Medicine

## 2020-12-31 DIAGNOSIS — Z79899 Other long term (current) drug therapy: Secondary | ICD-10-CM

## 2020-12-31 DIAGNOSIS — Z8249 Family history of ischemic heart disease and other diseases of the circulatory system: Secondary | ICD-10-CM | POA: Diagnosis not present

## 2020-12-31 DIAGNOSIS — I10 Essential (primary) hypertension: Secondary | ICD-10-CM | POA: Diagnosis present

## 2020-12-31 DIAGNOSIS — Z888 Allergy status to other drugs, medicaments and biological substances status: Secondary | ICD-10-CM

## 2020-12-31 DIAGNOSIS — I639 Cerebral infarction, unspecified: Principal | ICD-10-CM | POA: Diagnosis present

## 2020-12-31 DIAGNOSIS — I63431 Cerebral infarction due to embolism of right posterior cerebral artery: Secondary | ICD-10-CM

## 2020-12-31 DIAGNOSIS — H5340 Unspecified visual field defects: Secondary | ICD-10-CM | POA: Diagnosis present

## 2020-12-31 DIAGNOSIS — Z20822 Contact with and (suspected) exposure to covid-19: Secondary | ICD-10-CM | POA: Diagnosis present

## 2020-12-31 DIAGNOSIS — Z86010 Personal history of colonic polyps: Secondary | ICD-10-CM

## 2020-12-31 DIAGNOSIS — H538 Other visual disturbances: Secondary | ICD-10-CM | POA: Diagnosis present

## 2020-12-31 DIAGNOSIS — Q211 Atrial septal defect: Secondary | ICD-10-CM

## 2020-12-31 DIAGNOSIS — R7303 Prediabetes: Secondary | ICD-10-CM | POA: Diagnosis present

## 2020-12-31 DIAGNOSIS — R29700 NIHSS score 0: Secondary | ICD-10-CM | POA: Diagnosis present

## 2020-12-31 DIAGNOSIS — R29701 NIHSS score 1: Secondary | ICD-10-CM | POA: Diagnosis not present

## 2020-12-31 DIAGNOSIS — Z818 Family history of other mental and behavioral disorders: Secondary | ICD-10-CM

## 2020-12-31 DIAGNOSIS — R053 Chronic cough: Secondary | ICD-10-CM | POA: Diagnosis not present

## 2020-12-31 DIAGNOSIS — F32A Depression, unspecified: Secondary | ICD-10-CM | POA: Diagnosis present

## 2020-12-31 DIAGNOSIS — E785 Hyperlipidemia, unspecified: Secondary | ICD-10-CM | POA: Diagnosis present

## 2020-12-31 DIAGNOSIS — Z885 Allergy status to narcotic agent status: Secondary | ICD-10-CM

## 2020-12-31 DIAGNOSIS — G43909 Migraine, unspecified, not intractable, without status migrainosus: Secondary | ICD-10-CM | POA: Diagnosis not present

## 2020-12-31 DIAGNOSIS — K219 Gastro-esophageal reflux disease without esophagitis: Secondary | ICD-10-CM | POA: Diagnosis present

## 2020-12-31 DIAGNOSIS — Z823 Family history of stroke: Secondary | ICD-10-CM | POA: Diagnosis not present

## 2020-12-31 DIAGNOSIS — Z8673 Personal history of transient ischemic attack (TIA), and cerebral infarction without residual deficits: Secondary | ICD-10-CM | POA: Diagnosis present

## 2020-12-31 HISTORY — DX: Essential (primary) hypertension: I10

## 2020-12-31 LAB — BASIC METABOLIC PANEL
Anion gap: 10 (ref 5–15)
BUN: 16 mg/dL (ref 8–23)
CO2: 25 mmol/L (ref 22–32)
Calcium: 9.4 mg/dL (ref 8.9–10.3)
Chloride: 103 mmol/L (ref 98–111)
Creatinine, Ser: 0.56 mg/dL (ref 0.44–1.00)
GFR, Estimated: >60 mL/min (ref >60)
Glucose, Bld: 93 mg/dL (ref 70–99)
Potassium: 3.8 mmol/L (ref 3.5–5.1)
Sodium: 138 mmol/L (ref 135–145)

## 2020-12-31 LAB — CBC WITH DIFFERENTIAL/PLATELET
Abs Immature Granulocytes: 0.01 10*3/uL (ref 0.00–0.07)
Basophils Absolute: 0.1 10*3/uL (ref 0.0–0.1)
Basophils Relative: 2 %
Eosinophils Absolute: 0.2 10*3/uL (ref 0.0–0.5)
Eosinophils Relative: 4 %
HCT: 43.9 % (ref 36.0–46.0)
Hemoglobin: 14 g/dL (ref 12.0–15.0)
Immature Granulocytes: 0 %
Lymphocytes Relative: 37 %
Lymphs Abs: 2.3 10*3/uL (ref 0.7–4.0)
MCH: 29.5 pg (ref 26.0–34.0)
MCHC: 31.9 g/dL (ref 30.0–36.0)
MCV: 92.6 fL (ref 80.0–100.0)
Monocytes Absolute: 0.6 10*3/uL (ref 0.1–1.0)
Monocytes Relative: 10 %
Neutro Abs: 2.9 10*3/uL (ref 1.7–7.7)
Neutrophils Relative %: 47 %
Platelets: 322 10*3/uL (ref 150–400)
RBC: 4.74 MIL/uL (ref 3.87–5.11)
RDW: 13 % (ref 11.5–15.5)
WBC: 6.2 10*3/uL (ref 4.0–10.5)
nRBC: 0 % (ref 0.0–0.2)

## 2020-12-31 LAB — RESP PANEL BY RT-PCR (FLU A&B, COVID) ARPGX2
Influenza A by PCR: NEGATIVE
Influenza B by PCR: NEGATIVE
SARS Coronavirus 2 by RT PCR: NEGATIVE

## 2020-12-31 MED ORDER — SODIUM CHLORIDE 0.9 % IV SOLN
INTRAVENOUS | Status: DC
Start: 2021-01-01 — End: 2021-01-01

## 2020-12-31 MED ORDER — ACETAMINOPHEN 325 MG PO TABS: 650.0000 mg | ORAL_TABLET | ORAL | Status: DC | PRN

## 2020-12-31 MED ORDER — STROKE: EARLY STAGES OF RECOVERY BOOK
Freq: Once | Status: AC
  Filled 2020-12-31: qty 1

## 2020-12-31 MED ORDER — ACETAMINOPHEN 650 MG RE SUPP: 650.0000 mg | RECTAL | Status: DC | PRN

## 2020-12-31 MED ORDER — LORAZEPAM 2 MG/ML IJ SOLN
1.0000 mg | Freq: Once | INTRAMUSCULAR | Status: AC
  Administered 2021-01-01: 1 mg via INTRAVENOUS
  Filled 2020-12-31 (×2): qty 1

## 2020-12-31 MED ORDER — ACETAMINOPHEN 160 MG/5ML PO SOLN: 650.0000 mg | ORAL | Status: DC | PRN

## 2020-12-31 NOTE — ED Notes (Signed)
Pt ambulated to bathroom, will provide urine specimen

## 2020-12-31 NOTE — H&P (Addendum)
Yolanda Gibbs PZW:[id number] DOB: 10/16/1950 DOA: 12/31/2020     PCP: Sandford Craze, NP   Outpatient Specialists:  NONE    Patient arrived to ER on 12/31/20 at 1200 Referred by Attending Therisa Doyne, MD   Patient coming from: home Lives   With family    Chief Complaint:  Chief Complaint  Patient presents with  . Hypertension  . Blurred Vision    HPI: Yolanda Gibbs is a 70 y.o. female with medical history significant of HTN,  HLD, depression    Presented with   1 wk hx of blurry eye vision worse on the left side of her gaze.  Initially she had severe headache associated with change.  She was seen by ophthalmologist and was told that there is no problem with her eyes themselves she went her primary care provider noted to have elevated blood pressure she was started on losartan 25 mg she continued to have more elevated blood pressures so she went back to her primary care provider who increased it up to 50 mg.  She continued to have symptoms have had some intermittent headaches associated that  She has been feeling a little bit more tired than her usual self. No numbness no weakness no slurred speech No nausea vomiting constipation no chest pain or shortness of breath Patient does not smoke  Drinks alcohol occasionally    Has  been vaccinated against COVID  and boosted   Initial COVID TEST  NEGATIVE    Lab Results  Component Value Date   SARSCOV2NAA NEGATIVE 12/31/2020     Regarding pertinent Chronic problems:     Hyperlipidemia -  on statins simvastatin Lipid Panel     Component Value Date/Time   CHOL 266 (H) 11/01/2015 1006   TRIG 93.0 11/01/2015 1006   HDL 80.40 11/01/2015 1006   CHOLHDL 3 11/01/2015 1006   VLDL 18.6 11/01/2015 1006   LDLCALC 167 (H) 11/01/2015 1006     HTN on losartan     While in ER: CT head showed 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 Neurology was consulted recommended transfer to [facility] for MRI and further  stroke work-up    ED Triage Vitals  Enc Vitals Group     BP 12/31/20 1242 (!) 146/97     Pulse Rate 12/31/20 1242 85     Resp 12/31/20 1242 18     Temp 12/31/20 1242 98.7 F (37.1 C)     Temp Source 12/31/20 1242 Oral     SpO2 12/31/20 1242 99 %     Weight 12/31/20 1243 152 lb (68.9 kg)     Height 12/31/20 1243 5\' 4"  (1.626 m)     Head Circumference --      Peak Flow --      Pain Score 12/31/20 1243 1     Pain Loc --      Pain Edu? --      Excl. in GC? --   TMAX(24)@     _________________________________________ Significant initial  Findings: Abnormal Labs Reviewed - No abnormal labs to display ____________________________________________ Ordered CT HEAD Focal area of decreased attenuation in the posterior aspect of the mid right occipital lobe, felt to represent a recent and potentially acute focal infarct in this area  CXR - ? COPD   ECG: Ordered Personally reviewed by me showing: HR :  65 Rhythm:  NSR,    no evidence of ischemic changes QTC 427 _______________   The recent clinical data is  shown below. Vitals:   12/31/20 1745 12/31/20 1815 12/31/20 2200 12/31/20 2322  BP: (!) 149/87 (!) 158/80 (!) 148/86 (!) 157/84  Pulse: 64 64 64 72  Resp: Temp:    97.8 F (36.6 C)  TempSrc:      SpO2: 96% 97% 95% 94%  Weight:      Height:        WBC      Component Value Date/Time   WBC 6.2 12/31/2020 1638   LYMPHSABS 2.3 12/31/2020 1638   MONOABS 0.6 12/31/2020 1638   EOSABS 0.2 12/31/2020 1638   BASOSABS 0.1 12/31/2020 1638    UA  ordered    Results for orders placed or performed during the hospital encounter of 12/31/20  Resp Panel by RT-PCR (Flu A&B, Covid) Nasopharyngeal Swab     Status: None   Collection Time: 12/31/20  6:33 PM   Specimen: Nasopharyngeal Swab; Nasopharyngeal(NP) swabs in vial transport medium  Result Value Ref Range Status   SARS Coronavirus 2 by RT PCR NEGATIVE NEGATIVE Final         Influenza A by PCR NEGATIVE NEGATIVE  Final   Influenza B by PCR NEGATIVE NEGATIVE Final           _______________________________________________________ ER Provider Called:   Neurology Dr.Stack They Recommend admit to medicine  transferred to [facility] Will see arrival _______________________________________________ Hospitalist was called for admission for occipital CVA  The following Work up has been ordered so far:  Orders Placed This Encounter  Procedures  . Resp Panel by RT-PCR (Flu A&B, Covid) Nasopharyngeal Swab  . CT Head Wo Contrast  . DG CHEST PORT 1 VIEW  . Basic metabolic panel  . CBC with Differential  . Visual acuity screening  . Cardiac monitoring  . Stroke swallow screen  . Neuro checks  . Consult to hospitalist  . ED EKG  . EKG 12-Lead  . Place in observation (patient's expected length of stay will be less than 2 midnights)    Following Medications were ordered in ER: Medications  LORazepam (ATIVAN) injection 1 mg (has no administration in time range)        Consult Orders  (From admission, onward)         Start     Ordered   12/31/20 1828  Consult to hospitalist  Spoke with Selena Batten at [facility] regarding [facility] hospitalist for admission  Once       Provider:  (Not yet assigned)  Question Answer Comment  Place call to: [facility]   Reason for Consult Admit      12/31/20 1827          OTHER Significant initial  Findings:  labs showing:    Recent Labs  Lab 12/31/20 1638  NA 138  K 3.8  CO2 25  GLUCOSE 93  BUN 16  CREATININE 0.56  CALCIUM 9.4    Cr   Stable,  Lab Results  Component Value Date   CREATININE 0.56 12/31/2020   CREATININE 0.63 11/01/2015   CREATININE 0.63 10/26/2014    No results for input(s): AST, ALT, ALKPHOS, BILITOT, PROT, ALBUMIN in the last 168 hours. Lab Results  Component Value Date   CALCIUM 9.4 12/31/2020      Plt: Lab Results  Component Value Date   PLT 322 12/31/2020     HG/HCT  Stable     Component Value Date/Time   HGB  14.0 12/31/2020 1638   HCT 43.9 12/31/2020 1638  MCV 92.6 12/31/2020 1638          Cultures: No results found for: SDES, SPECREQUEST, CULT, REPTSTATUS   Radiological Exams on Admission: CT Head Wo Contrast  Result Date: 12/31/2020 CLINICAL DATA:  Headache with blurred vision.  Hypertension EXAM: CT HEAD WITHOUT CONTRAST TECHNIQUE: Contiguous axial images were obtained from the base of the skull through the vertex without intravenous contrast. COMPARISON:  None. FINDINGS: Brain: The ventricles and sulci are normal in size and configuration. There is no intracranial mass, hemorrhage, extra-axial fluid collection, or midline shift. There is a focal area of decreased attenuation in the posterior mid right occipital lobe consistent with a small suspected recent infarct in the right occipital lobe posteriorly. Elsewhere brain parenchyma appears unremarkable. Vascular: No hyperdense vessel. No appreciable vascular calcification. Skull: Bony calvarium appears intact. There is a midline frontal exostosis measuring 1.3 x 0.6 cm, a benign finding. Sinuses/Orbits: Visualized paranasal sinuses are clear. Orbits appear symmetric bilaterally. Other: Mastoid air cells clear. IMPRESSION: Focal area of decreased attenuation in the posterior aspect of the mid right occipital lobe, felt to represent a recent and potentially acute focal infarct in this area. On CT, this area of apparent infarct measures approximately 1.5 x 1.5 cm. Elsewhere brain parenchyma appears unremarkable. No mass or hemorrhage. Electronically Signed   By: Bretta Bang III M.D.   On: 12/31/2020 17:36   DG CHEST PORT 1 VIEW  Result Date: 12/31/2020 CLINICAL DATA:  Acute migraine blurred vision, elevated blood pressure EXAM: PORTABLE CHEST 1 VIEW COMPARISON:  Report from chest radiograph December 17, 2006 however no imaging available for review at time of dictation. FINDINGS: The heart size and mediastinal contours are within normal limits.  Pulmonary hyperinflation with chronic appearing bronchitic lung changes. No focal consolidation. No visible pleural effusion or pneumothorax. The visualized skeletal structures are unremarkable. IMPRESSION: 1. No acute cardiopulmonary findings. 2. Pulmonary hyperinflation with chronic appearing bronchitic lung changes suggestive of chronic obstructive pulmonary disease. Electronically Signed   By: Maudry Mayhew MD   On: 12/31/2020 21:42   _______________________________________________________________________________________________________ Latest  Blood pressure (!) 157/84, pulse 72, temperature 97.8 F (36.6 C), resp. rate 18, height  (1.626 m), weight 68.9 kg, last menstrual period 08/19/2004, SpO2 94 %.   Review of Systems:    Pertinent positives include:  Anxiety. Blurry vision, headaches, Constitutional:  No weight loss, night sweats, Fevers, chills, fatigue, weight loss  HEENT:  No  Difficulty swallowing,Tooth/dental problems,Sore throat,  No sneezing, itching, ear ache, nasal congestion, post nasal drip,  Cardio-vascular:  No chest pain, Orthopnea, PND, anasarca, dizziness, palpitations.no Bilateral lower extremity swelling  GI:  No heartburn, indigestion, abdominal pain, nausea, vomiting, diarrhea, change in bowel habits, loss of appetite, melena, blood in stool, hematemesis Resp:  no shortness of breath at rest. No dyspnea on exertion, No excess mucus, no productive cough, No non-productive cough, No coughing up of blood.No change in color of mucus.No wheezing. Skin:  no rash or lesions. No jaundice GU:  no dysuria, change in color of urine, no urgency or frequency. No straining to urinate.  No flank pain.  Musculoskeletal:  No joint pain or no joint swelling. No decreased range of motion. No back pain.  Psych:  No change in mood or affect. No depression or  No memory loss.  Neuro: no localizing neurological complaints, no tingling, no weakness, no double  no gait  abnormality, no slurred speech, no confusion  All systems reviewed and apart from HOPI all are negative _______________________________________________________________________________________________  Past Medical History:   Past Medical History:  Diagnosis Date  . Allergy    seasonal  . Depression   . GERD (gastroesophageal reflux disease)   . History of chicken pox   . History of colon polyps    benign  . Hypertension       History reviewed. No pertinent surgical history.  Social History:  Ambulatory   Independently      reports that she has never smoked. She has never used smokeless tobacco. She reports current alcohol use of about 3.0 - 4.0 standard drinks of alcohol per week. She reports that she does not use drugs.     Family History:   Family History  Problem Relation Age of Onset  . Arthritis Mother   . CAD Mother   . AAA (abdominal aortic aneurysm) Mother   . Cancer Mother        lung  . Stroke Father   . Cancer Maternal Aunt        breast  . Arthritis Maternal Grandmother   . Cancer Maternal Grandfather        prostate  . Depression Paternal Grandmother    ______________________________________________________________________________________________ Allergies: Allergies  Allergen Reactions  . Darvon [Propoxyphene] Other (See Comments)    "Slept for 3 days"  . Duloxetine     Added from [facility]- patient is unsure        Prior to Admission medications   Medication Sig Start Date End Date Taking? Authorizing Provider  azelastine (ASTELIN) 0.1 % nasal spray Place 2 sprays into both nostrils 2 (two) times daily. Use in each nostril as directed 03/18/17   Saguier, Ramon Dredge, PA-C  Calcium-Phosphorus-Vitamin D (CITRACAL +D3 PO) Take 1-2 tablets by mouth 2 (two) times daily. Vit d 500 IU, calcium 630mg  each    [provider]  cetirizine (ZYRTEC) 10 MG tablet Take 10 mg by mouth daily.    [provider]  Docusate Sodium 100 MG  capsule Take 100 mg by mouth as needed. 06/01/13   [provider]  esomeprazole (NEXIUM) 40 MG capsule TAKE 1 CAPSULE(40 MG) BY MOUTH DAILY 07/07/18   07/09/18, NP  mometasone (NASONEX) 50 MCG/ACT nasal spray Place 2 sprays into the nose daily. 05/11/18   05/13/18, NP  montelukast (SINGULAIR) 10 MG tablet Take 1 tablet (10 mg total) by mouth at bedtime. 05/11/18   05/13/18, NP  Multiple Vitamins-Minerals (THRIVE FOR [employer] WOMENS) TABS Take 1 capsule by mouth daily. 1 capsule, 1 shake and 1 patch daily.    [provider]  venlafaxine XR (EFFEXOR-XR) 75 MG 24 hr capsule TAKE 1 CAPSULE BY MOUTH DAILY WITH BREAKFAST 07/08/18   07/10/18, NP   ___________________________________________________________________________________________________ Physical Exam: Vitals with BMI 12/31/2020 12/31/2020 12/31/2020  Height - - -  Weight - - -  BMI - - -  Systolic 157 148 01/02/2021  Diastolic 84 86 80  Pulse 72 64 64     1. General:  in No  Acute distress   well  -appearing 2. Psychological: Alert and      Oriented 3. Head/ENT:   Dry Mucous Membranes                          Head Non traumatic, neck supple                            Poor Dentition 4. SKIN:  decreased Skin turgor,  Skin clean Dry and intact no rash 5. Heart: Regular rate and rhythm no  Murmur, no Rub or gallop 6. Lungs:  no wheezes or crackles   7. Abdomen: Soft,  non-tender, Non distended bowel sounds present 8. Lower extremities: no clubbing, cyanosis, no   edema 9. Neurologically strength 5 out of 5 in all 4 extremities cranial nerves II through XII intact 10. MSK: Normal range of motion    Chart has been reviewed  ______________________________________________________________________________________________  Assessment/Plan   70 y.o. female with medical history significant of HTN,  HLD, depression    Admitted for CVA  Present on Admission: . CVA (cerebral vascular  accident) [facility](HCC) -  - will admit based on TIA/CVA protocol,         Monitor on Tele       MRA/MRI await results   CT showed subacute CVA              Echo to evaluate for possible embolic source,        obtain cardiac enzymes,  ECG,   Lipid panel, TSH.        Order PT/OT evaluation.        keep nothing by mouth until passes swallow eval            Will make sure patient is on antiplatelet   agent and statin        Allow permissive Hypertension keep BP <220/120        Neurology consulted will see pt tonight  . Hyperlipidemia -change statin to Lipitor  . Depression continue Effexor  . Essential hypertension allow permissive  . GERD (gastroesophageal reflux disease) Continue PPI  Other plan as per orders.  DVT prophylaxis:  SCD      Code Status:    Code Status: Not on file FULL CODE  as per patient  I had personally discussed CODE STATUS with patient     Family Communication:   Family not at  Bedside    Disposition Plan:     To home once workup is complete and patient is stable   Following barriers for discharge:                                                        Will need consultants to evaluate patient prior to discharge                      Would benefit from PT/OT eval prior to DC  Ordered                                        Consults called: neurology is aware    Admission status:  ED Disposition    ED Disposition Condition Comment   Admit  [facility][id number]  Level of Care: Telemetry Medical [104]  I expect the patient will be discharged within 24 hours: No (not a candidate for 5C-Observation unit)  Covid Evaluation: Asymptomatic Screening Protocol (No Symptoms)  Diagnosis: CVA (cerebral vascular accident) [facility](HCC) [[id number]]) [[id number]]  Admitting Physician: Therisa DoyneUTOVA, Dyrell Tuccillo [[id number]]  Attending Physician: Therisa DoyneUTOVA, Phelicia Dantes [[id number]]       Obs     Level of  care     Tele  indefinitely please discontinue once patient no longer  qualifies COVID-19 Labs    Lab Results  Component Value Date   SARSCOV2NAA NEGATIVE 12/31/2020     Precautions: admitted as  Covid Negative  PPE: Used by the provider:   N95  eye Goggles,  Gloves      Breandan People 01/01/2021, 1:01 AM    [facility]     after 2 AM please page floor coverage PA If 7AM-7PM, please contact the day team taking care of the patient using [email or web address]   Patient was evaluated in the context of the global COVID-19 pandemic, which necessitated consideration that the patient might be at risk for infection with the SARS-CoV-2 virus that causes COVID-19. Institutional protocols and algorithms that pertain to the evaluation of patients at risk for COVID-19 are in a state of rapid change based on information released by regulatory bodies including the CDC and federal and state organizations. These policies and algorithms were followed during the patient's care.

## 2020-12-31 NOTE — ED Provider Notes (Signed)
[facility] EMERGENCY DEPARTMENT Provider Note   CSN: [id number] Arrival date & time: 12/31/20  1200     History Chief Complaint  Patient presents with  . Hypertension  . Blurred Vision    Yolanda Gibbs is a 70 y.o. female past medical history of hyperlipidemia, GERD with chronic cough, presenting for evaluation of blurry vision and hypertension.  Patient states symptoms began on Sunday with one of her typical migraine headaches behind her eyes.  She states she also had a typical aura during that time as well.  She had some bilateral blurry vision with her headache.  Her headache resolved the next day as well as her aura, however bilateral blurry vision persisted.  She went to her eye doctor on the following day who fully evaluated her and she had normal exam.  They did note high blood pressure therefore she follow-up with her PCP.  States her PCP started her on 25 mg of losartan daily.  Blood pressure was running 160s over 80s.  States her blurry vision persisted throughout the week, states the blurriness is to left visual field of both eyes. Reports intermittent mild headache though nothing severe.  Feels more tired than usual.  Follow back up with PCP on Friday who increased her losartan dose to 50 mg daily.  States symptoms have not changed any.  She presents today for evaluation of her bilateral blurry vision.  No numbness, weakness, speech difficulty, facial asymmetry, unsteady gait, new CP, SOB, abdominal complaints.  No other new medications, diet changes, recent illness.  The history is provided by the patient.       Past Medical History:  Diagnosis Date  . Allergy    seasonal  . Depression   . GERD (gastroesophageal reflux disease)   . History of chicken pox   . History of colon polyps    benign  . Hypertension     Patient Active Problem List   Diagnosis Date Noted  . CVA (cerebral vascular accident) (HCC) 12/31/2020  . Hot flashes 12/08/2015  .  Laryngopharyngeal reflux (LPR) 05/24/2015  . Hyperlipidemia 10/27/2014  . Preventative health care 10/26/2014  . History of colonic polyps 08/29/2014  . Depression 08/29/2014  . Allergic rhinitis 09/21/2007  . ESOPHAGEAL REFLUX 09/21/2007  . COUGH 09/21/2007    History reviewed. No pertinent surgical history.   OB History   No obstetric history on file.     Family History  Problem Relation Age of Onset  . Arthritis Mother   . CAD Mother   . AAA (abdominal aortic aneurysm) Mother   . Cancer Mother        lung  . Stroke Father   . Cancer Maternal Aunt        breast  . Arthritis Maternal Grandmother   . Cancer Maternal Grandfather        prostate  . Depression Paternal Grandmother     Social History   Tobacco Use  . Smoking status: Never Smoker  . Smokeless tobacco: Never Used  Vaping Use  . Vaping Use: Never used  Substance Use Topics  . Alcohol use: Yes    Alcohol/week: 3.0 - 4.0 standard drinks    Types: 3 - 4 Standard drinks or equivalent per week    Comment: occasional  . Drug use: No    Home Medications Prior to Admission medications   Medication Sig Start Date End Date Taking? Authorizing Provider  azelastine (ASTELIN) 0.1 % nasal spray Place 2 sprays into both  nostrils 2 (two) times daily. Use in each nostril as directed 03/18/17   Saguier, Ramon Dredge, PA-C  Calcium-Phosphorus-Vitamin D (CITRACAL +D3 PO) Take 1-2 tablets by mouth 2 (two) times daily. Vit d 500 IU, calcium 630mg  each    [provider]  cetirizine (ZYRTEC) 10 MG tablet Take 10 mg by mouth daily.    [provider]  Docusate Sodium 100 MG capsule Take 100 mg by mouth as needed. 06/01/13   [provider]  esomeprazole (NEXIUM) 40 MG capsule TAKE 1 CAPSULE(40 MG) BY MOUTH DAILY 07/07/18   07/09/18, NP  mometasone (NASONEX) 50 MCG/ACT nasal spray Place 2 sprays into the nose daily. 05/11/18   05/13/18, NP  montelukast (SINGULAIR) 10 MG tablet Take 1  tablet (10 mg total) by mouth at bedtime. 05/11/18   05/13/18, NP  Multiple Vitamins-Minerals (THRIVE FOR LIFE WOMENS) TABS Take 1 capsule by mouth daily. 1 capsule, 1 shake and 1 patch daily.    [provider]  venlafaxine XR (EFFEXOR-XR) 75 MG 24 hr capsule TAKE 1 CAPSULE BY MOUTH DAILY WITH BREAKFAST 07/08/18   07/10/18, NP    Allergies    Darvon [propoxyphene] and Duloxetine  Review of Systems   Review of Systems  Eyes: Positive for visual disturbance.  Neurological: Positive for headaches.  All other systems reviewed and are negative.   Physical Exam Updated Vital Signs BP (!) 158/80   Pulse 64   Temp 98.7 F (37.1 C) (Oral)   Resp 14   Ht 5\' 4"  (1.626 m)   Wt 68.9 kg   LMP 08/19/2004   SpO2 97%   BMI 26.09 kg/m   Physical Exam Vitals and nursing note reviewed.  Constitutional:      Appearance: She is well-developed.  HENT:     Head: Normocephalic and atraumatic.  Eyes:     Conjunctiva/sclera: Conjunctivae normal.  Cardiovascular:     Rate and Rhythm: Normal rate and regular rhythm.  Pulmonary:     Effort: Pulmonary effort is normal. No respiratory distress.     Breath sounds: Normal breath sounds.  Abdominal:     General: Bowel sounds are normal.     Palpations: Abdomen is soft.     Tenderness: There is no abdominal tenderness.  Musculoskeletal:     Right lower leg: No edema.     Left lower leg: No edema.  Skin:    General: Skin is warm.  Neurological:     Mental Status: She is alert.     Comments: Mental Status:  Alert, oriented, thought content appropriate, able to give a coherent history. Speech fluent without evidence of aphasia. Able to follow 2 step commands without difficulty.  Cranial Nerves:  II:  Peripheral visual fields grossly normal, pupils equal, round, reactive to light III,IV, VI: ptosis not present, extra-ocular motions intact bilaterally  V,VII: smile symmetric, facial light touch sensation equal VIII:  hearing grossly normal to voice  X: uvula elevates symmetrically  XI: bilateral shoulder shrug symmetric and strong XII: midline tongue extension without fassiculations Motor:  Normal tone. 5/5 strength in upper and lower extremities bilaterally including strong and equal grip strength and dorsiflexion/plantar flexion Sensory: grossly normal in all extremities.  Cerebellar: normal finger-to-nose with bilateral upper extremities Gait: normal gait and balance CV: distal pulses palpable throughout    Psychiatric:        Behavior: Behavior normal.     ED Results / Procedures / Treatments   Labs (all labs ordered are  listed, but only abnormal results are displayed) Labs Reviewed  RESP PANEL BY RT-PCR (FLU A&B, COVID) [id number]  BASIC METABOLIC PANEL  CBC WITH DIFFERENTIAL/PLATELET    EKG EKG Interpretation  Date/Time:  Sunday Dec 31 2020 16:43:24 EDT Ventricular Rate:  65 PR Interval:  200 QRS Duration: 86 QT Interval:  410 QTC Calculation: 427 R Axis:   74 Text Interpretation: Sinus rhythm No old tracing to compare Confirmed by Rolan BuccoBelfi, Melanie [phone]([id number]) on 12/31/2020 5:05:15 PM   Radiology CT Head Wo Contrast  Result Date: 12/31/2020 CLINICAL DATA:  Headache with blurred vision.  Hypertension EXAM: CT HEAD WITHOUT CONTRAST TECHNIQUE: Contiguous axial images were obtained from the base of the skull through the vertex without intravenous contrast. COMPARISON:  None. FINDINGS: Brain: The ventricles and sulci are normal in size and configuration. There is no intracranial mass, hemorrhage, extra-axial fluid collection, or midline shift. There is a focal area of decreased attenuation in the posterior mid right occipital lobe consistent with a small suspected recent infarct in the right occipital lobe posteriorly. Elsewhere brain parenchyma appears unremarkable. Vascular: No hyperdense vessel. No appreciable vascular calcification. Skull: Bony calvarium appears intact. There is a midline  frontal exostosis measuring 1.3 x 0.6 cm, a benign finding. Sinuses/Orbits: Visualized paranasal sinuses are clear. Orbits appear symmetric bilaterally. Other: Mastoid air cells clear. IMPRESSION: Focal area of decreased attenuation in the posterior aspect of the mid right occipital lobe, felt to represent a recent and potentially acute focal infarct in this area. On CT, this area of apparent infarct measures approximately 1.5 x 1.5 cm. Elsewhere brain parenchyma appears unremarkable. No mass or hemorrhage. Electronically Signed   By: Bretta BangWilliam  Woodruff III M.D.   On: 12/31/2020 17:36    Procedures Procedures   Medications Ordered in ED Medications - No data to display  ED Course  I have reviewed the triage vital signs and the nursing notes.  Pertinent labs & imaging results that were available during my care of the patient were reviewed by me and considered in my medical decision making (see chart for details).  Clinical Course as of 12/31/20 2020  Wynelle LinkSun Dec 31, 2020  1820 Consulted with neurologist Dr. Selina CooleyStack.  [facility] neurology to consult. [JR]    Clinical Course User Index [JR] Elisama Thissen, [place] N, PA-C   MDM Rules/Calculators/A&P                          Patient with presentation as above.  There are no neurologic deficits on examination including visual fields though patient states left visual field of both eyes is blurry.  Steady gait.  Blood pressure is elevated to 158/80, no other vital sign abnormalities.  Screening blood work is negative.  CT head however shows area in the posterior aspect of the mid right occipital lobe consistent with recent versus potentially acute focal infarct.  This was discussed with neurologist Dr. Selina CooleyStack.  Recommends medical [facility].  Patient will need MRI as well, as it is not available at this facility currently today.  Patient aware of the results in agreement with care plan for  admission.  Dr. Adela Glimpseoutova accepting admission for [facility].   Final Clinical Impression(s) / ED Diagnoses Final diagnoses:  Cerebrovascular accident (CVA), unspecified mechanism [facility](HCC)    Rx / DC Orders ED Discharge Orders    None       Destinie Thornsberry, [place] N, PA-C 12/31/20 2020  Rolan Bucco, MD 12/31/20 2320

## 2020-12-31 NOTE — ED Triage Notes (Signed)
Pt reports woke up Sunday with Migraine headache and blurred vision. Pt seen at [facility] medical clinic on Tuesday for elevated BP.

## 2021-01-01 ENCOUNTER — Observation Stay (HOSPITAL_COMMUNITY): Payer: Medicare Other

## 2021-01-01 DIAGNOSIS — Z20822 Contact with and (suspected) exposure to covid-19: Secondary | ICD-10-CM | POA: Diagnosis present

## 2021-01-01 DIAGNOSIS — Z79899 Other long term (current) drug therapy: Secondary | ICD-10-CM | POA: Diagnosis not present

## 2021-01-01 DIAGNOSIS — H538 Other visual disturbances: Secondary | ICD-10-CM | POA: Diagnosis present

## 2021-01-01 DIAGNOSIS — I10 Essential (primary) hypertension: Secondary | ICD-10-CM | POA: Diagnosis present

## 2021-01-01 DIAGNOSIS — I639 Cerebral infarction, unspecified: Secondary | ICD-10-CM | POA: Diagnosis present

## 2021-01-01 DIAGNOSIS — Z823 Family history of stroke: Secondary | ICD-10-CM | POA: Diagnosis not present

## 2021-01-01 DIAGNOSIS — E785 Hyperlipidemia, unspecified: Secondary | ICD-10-CM | POA: Diagnosis present

## 2021-01-01 DIAGNOSIS — R7303 Prediabetes: Secondary | ICD-10-CM | POA: Diagnosis present

## 2021-01-01 DIAGNOSIS — Z888 Allergy status to other drugs, medicaments and biological substances status: Secondary | ICD-10-CM | POA: Diagnosis not present

## 2021-01-01 DIAGNOSIS — R053 Chronic cough: Secondary | ICD-10-CM | POA: Diagnosis present

## 2021-01-01 DIAGNOSIS — I6389 Other cerebral infarction: Secondary | ICD-10-CM

## 2021-01-01 DIAGNOSIS — H5340 Unspecified visual field defects: Secondary | ICD-10-CM | POA: Diagnosis present

## 2021-01-01 DIAGNOSIS — I63431 Cerebral infarction due to embolism of right posterior cerebral artery: Secondary | ICD-10-CM | POA: Diagnosis not present

## 2021-01-01 DIAGNOSIS — Z885 Allergy status to narcotic agent status: Secondary | ICD-10-CM | POA: Diagnosis not present

## 2021-01-01 DIAGNOSIS — K219 Gastro-esophageal reflux disease without esophagitis: Secondary | ICD-10-CM | POA: Diagnosis present

## 2021-01-01 DIAGNOSIS — Q211 Atrial septal defect: Secondary | ICD-10-CM | POA: Diagnosis not present

## 2021-01-01 DIAGNOSIS — Z8249 Family history of ischemic heart disease and other diseases of the circulatory system: Secondary | ICD-10-CM | POA: Diagnosis not present

## 2021-01-01 DIAGNOSIS — R29701 NIHSS score 1: Secondary | ICD-10-CM | POA: Diagnosis not present

## 2021-01-01 DIAGNOSIS — Z86010 Personal history of colonic polyps: Secondary | ICD-10-CM | POA: Diagnosis not present

## 2021-01-01 DIAGNOSIS — G43909 Migraine, unspecified, not intractable, without status migrainosus: Secondary | ICD-10-CM | POA: Diagnosis present

## 2021-01-01 DIAGNOSIS — Z818 Family history of other mental and behavioral disorders: Secondary | ICD-10-CM | POA: Diagnosis not present

## 2021-01-01 DIAGNOSIS — F32A Depression, unspecified: Secondary | ICD-10-CM | POA: Diagnosis present

## 2021-01-01 DIAGNOSIS — R29700 NIHSS score 0: Secondary | ICD-10-CM | POA: Diagnosis present

## 2021-01-01 LAB — ECHOCARDIOGRAM COMPLETE
AR max vel: 3.27 cm2
AV Area VTI: 3.02 cm2
AV Area mean vel: 3.01 cm2
AV Mean grad: 4 mmHg
AV Peak grad: 6.2 mmHg
Ao pk vel: 1.24 m/s
Area-P 1/2: 4.15 cm2
Height: 64 in
S' Lateral: 2.2 cm
Weight: 2432 oz

## 2021-01-01 LAB — LIPID PANEL
Cholesterol: 190 mg/dL (ref 0–200)
HDL: 84 mg/dL (ref >40)
LDL Cholesterol: 89 mg/dL (ref 0–99)
Total CHOL/HDL Ratio: 2.3 RATIO
Triglycerides: 84 mg/dL (ref <150)
VLDL: 17 mg/dL (ref 0–40)

## 2021-01-01 LAB — HIV ANTIBODY (ROUTINE TESTING W REFLEX): HIV Screen 4th Generation wRfx: NONREACTIVE

## 2021-01-01 LAB — HEMOGLOBIN A1C
Hgb A1c MFr Bld: 6.2 % — ABNORMAL HIGH (ref 4.8–5.6)
Mean Plasma Glucose: 131.24 mg/dL

## 2021-01-01 MED ORDER — CLOPIDOGREL BISULFATE 300 MG PO TABS
300.0000 mg | ORAL_TABLET | Freq: Once | ORAL | Status: AC
  Administered 2021-01-01: 300 mg via ORAL
  Filled 2021-01-01: qty 1

## 2021-01-01 MED ORDER — VENLAFAXINE HCL ER 75 MG PO CP24
75.0000 mg | ORAL_CAPSULE | Freq: Every day | ORAL | Status: DC
  Administered 2021-01-01 – 2021-01-02 (×2): 75 mg via ORAL
  Filled 2021-01-01 (×2): qty 1

## 2021-01-01 MED ORDER — ATORVASTATIN CALCIUM 80 MG PO TABS
80.0000 mg | ORAL_TABLET | Freq: Every day | ORAL | Status: DC
  Administered 2021-01-01: 80 mg via ORAL
  Filled 2021-01-01: qty 1

## 2021-01-01 MED ORDER — ASPIRIN EC 81 MG PO TBEC
81.0000 mg | DELAYED_RELEASE_TABLET | Freq: Once | ORAL | Status: AC
  Administered 2021-01-01: 81 mg via ORAL
  Filled 2021-01-01: qty 1

## 2021-01-01 MED ORDER — ATORVASTATIN CALCIUM 40 MG PO TABS
40.0000 mg | ORAL_TABLET | Freq: Every day | ORAL | Status: DC
  Administered 2021-01-02: 40 mg via ORAL
  Filled 2021-01-01: qty 1

## 2021-01-01 MED ORDER — SODIUM CHLORIDE 0.9 % IV SOLN: INTRAVENOUS | Status: DC

## 2021-01-01 MED ORDER — CLOPIDOGREL BISULFATE 75 MG PO TABS
75.0000 mg | ORAL_TABLET | Freq: Every day | ORAL | Status: DC
  Administered 2021-01-02: 75 mg via ORAL
  Filled 2021-01-01: qty 1

## 2021-01-01 MED ORDER — ASPIRIN EC 81 MG PO TBEC
81.0000 mg | DELAYED_RELEASE_TABLET | Freq: Every day | ORAL | Status: DC
  Administered 2021-01-02: 81 mg via ORAL
  Filled 2021-01-01: qty 1

## 2021-01-01 MED ORDER — PANTOPRAZOLE SODIUM 40 MG PO TBEC
40.0000 mg | DELAYED_RELEASE_TABLET | Freq: Every day | ORAL | Status: DC
  Administered 2021-01-01 – 2021-01-02 (×2): 40 mg via ORAL
  Filled 2021-01-01 (×2): qty 1

## 2021-01-01 MED ORDER — METOCLOPRAMIDE HCL 5 MG PO TABS: 5.0000 mg | ORAL_TABLET | Freq: Four times a day (QID) | ORAL | Status: DC | PRN

## 2021-01-01 MED ORDER — ENOXAPARIN SODIUM 40 MG/0.4ML IJ SOSY
40.0000 mg | PREFILLED_SYRINGE | INTRAMUSCULAR | Status: DC
  Administered 2021-01-01 – 2021-01-02 (×2): 40 mg via SUBCUTANEOUS
  Filled 2021-01-01 (×2): qty 0.4

## 2021-01-01 NOTE — Plan of Care (Signed)

## 2021-01-01 NOTE — Progress Notes (Signed)
PROGRESS NOTE  Yolanda Gibbs  ZOX:[id number] DOB: [birth year] DOA: 12/31/2020 PCP: Sandford Craze, NP   Brief Narrative: Yolanda Gibbs is a 70 y.o. female with a history of occipital migraines, idiopathic chronic cough who presented to the ED 5/15 with a week of vision changes described as "blurry vision" on the left visual fields present with both eyes and either eye alone that was not consistent with prior migraines. This prompted an optometry visit where no abnormality was found. Subsequently visited PCP where losartan was started for new-onset elevated BP. Despite taking this, there was no change, so she returned to her PCP where losartan dose was increased and patient sent home. She went to [facility] on 5/15 where CT head was positive for early subacute right occipital infarct. Neurology was consulted and work up is ongoing. TEE-loop recorder is planned pending results of TTE today.  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 - Starting DAPT x3 weeks, then single agent  - High-intensity statin. LDL 89 on simvastatin  PTA.  - Permissive HTN. No hx of HTN prior to this event. - PT/OT/SLP without recommendations for further follow up  Prediabetes: HbA1c has been ~6% for the past year treated with dietary modifications. HbA1c 6.2% here.  - PCP follow up advised, consider metformin.   GERD:  - Continue 2 home medications  Chronic cough: Has had extensive work up with ENT, GI, and visited pulmonary in the past without coming to an underlying diagnosis.  - Outpatient follow up recommended.  Depression:  - Continue venlafaxine  DVT prophylaxis: Lovenox Code Status:  Full Family Communication: At bedside Disposition Plan:  Status is: Inpatient  Remains inpatient appropriate because:Ongoing diagnostic testing needed not appropriate for outpatient work up   Dispo: The patient is from: Home              Anticipated d/c is to: Home              Patient currently is not medically stable to d/c.   Difficult to place patient No  Consultants:   Neurology  Procedures:   Echocardiogram  Antimicrobials:  None   Subjective: No new symptoms. Vision change is stable, constant.   Objective: Vitals:   01/01/21 0135 01/01/21 0425 01/01/21 0705 01/01/21 0818  BP: 140/83 (!) 147/84  139/86  Pulse: 72 73  75  Resp:    16  Temp: 97.8 F (36.6 C) 97.7 F (36.5 C)  98.3 F (36.8 C)  TempSrc: Oral Oral    SpO2: 96% 98% 96% 96%  Weight:      Height:        Intake/Output Summary (Last 24 hours) at 01/01/2021 1425 Last data filed at 01/01/2021 1423 Gross per 24 hour  Intake 786.85 ml  Output --  Net 786.85 ml   Filed Weights   12/31/20 1243  Weight: 68.9 kg    Gen: 70 y.o. female in no distress Pulm: Non-labored breathing room air. Clear to auscultation bilaterally.  CV: Regular rate and rhythm. No murmur, rub, or gallop. No JVD, no pedal edema. GI: Abdomen soft, non-tender, non-distended, with normoactive bowel sounds. No organomegaly or masses felt. Ext: Warm, no deformities Skin: No rashes, lesions or ulcers. Left thumb subungual hematoma  is minor. No splinter hemorrhages.  Neuro: Alert and oriented. Left-sided visual field deficit without other CN or peripheral nerve deficits, no dysmetria, dysdiadochokinesia. Psych: Judgement and insight appear normal. Mood & affect appropriate.   Data Reviewed: I have personally reviewed following labs and imaging studies  CBC: Recent Labs  Lab 12/31/20 1638  WBC 6.2  NEUTROABS 2.9  HGB 14.0  HCT 43.9  MCV 92.6  PLT 322   Basic Metabolic Panel: Recent Labs  Lab 12/31/20 1638  NA 138  K  3.8  CL 103  CO2 25  GLUCOSE 93  BUN 16  CREATININE 0.56  CALCIUM 9.4   GFR: Estimated Creatinine Clearance: 65.1 mL/min (by C-G formula based on SCr of 0.56 mg/dL). Liver Function Tests: No results for input(s): AST, ALT, ALKPHOS, BILITOT, PROT, ALBUMIN in the last 168 hours. No results for input(s): LIPASE, AMYLASE in the last 168 hours. No results for input(s): AMMONIA in the last 168 hours. Coagulation Profile: No results for input(s): INR, PROTIME in the last 168 hours. Cardiac Enzymes: No results for input(s): CKTOTAL, CKMB, CKMBINDEX, TROPONINI in the last 168 hours. BNP (last 3 results) No results for input(s): PROBNP in the last 8760 hours. HbA1C: Recent Labs    01/01/21 0015  HGBA1C 6.2*   CBG: No results for input(s): GLUCAP in the last 168 hours. Lipid Profile: Recent Labs    01/01/21 0015  CHOL 190  HDL 84  LDLCALC 89  TRIG 84  CHOLHDL 2.3   Thyroid Function Tests: No results for input(s): TSH, T4TOTAL, FREET4, T3FREE, THYROIDAB in the last 72 hours. Anemia Panel: No results for input(s): VITAMINB12, FOLATE, FERRITIN, TIBC, IRON, RETICCTPCT in the last 72 hours. Urine analysis:    Component Value Date/Time   COLORURINE YELLOW 11/01/2015 1006   APPEARANCEUR CLEAR 11/01/2015 1006   LABSPEC 1.020 11/01/2015 1006   PHURINE 7.0 11/01/2015 1006   GLUCOSEU NEGATIVE 11/01/2015 1006   HGBUR NEGATIVE 11/01/2015 1006   BILIRUBINUR neg 04/24/2016 1703   KETONESUR NEGATIVE 11/01/2015 1006   PROTEINUR neg 04/24/2016 1703   UROBILINOGEN 0.2 04/24/2016 1703   UROBILINOGEN 0.2 11/01/2015 1006   NITRITE neg 04/24/2016 1703   NITRITE NEGATIVE 11/01/2015 1006   LEUKOCYTESUR small (1+) (A) 04/24/2016 1703   Recent Results (from the past 240 hour(s))  Resp Panel by RT-PCR (Flu A&B, Covid) Nasopharyngeal Swab     Status: None   Collection Time: 12/31/20  6:33 PM   Specimen: Nasopharyngeal Swab; Nasopharyngeal(NP) swabs in vial transport medium  Result Value Ref  Range Status   SARS Coronavirus 2 by RT PCR NEGATIVE NEGATIVE Final    Comment: (NOTE) SARS-CoV-2 target nucleic acids are NOT DETECTED.  The SARS-CoV-2 RNA is generally detectable in upper respiratory specimens during the acute phase of infection. The lowest concentration of SARS-CoV-2 viral copies this assay can detect is 138 copies/mL. A negative result does not preclude SARS-Cov-2 infection and should not be used as the sole basis for treatment or other patient management decisions. A negative result may occur with  improper specimen collection/handling, submission of specimen other than nasopharyngeal swab, presence of viral mutation(s) within the areas targeted by this assay, and inadequate number of viral copies(<138 copies/mL). A negative result must be combined with clinical observations, patient history, and epidemiological information. The expected result is Negative.  Fact Sheet for Patients:  BloggerCourse.com  Fact Sheet for Healthcare Providers:  SeriousBroker.it  This test is no t yet approved or cleared by the [place] and  has been authorized for detection and/or diagnosis of SARS-CoV-2 by FDA under an Emergency Use Authorization (EUA). This EUA will remain  in effect (meaning this test can be used) for the duration of the COVID-19 declaration under Section 564(b)(1) of the Act, 21 U.S.C.section 360bbb-3(b)(1), unless the authorization is terminated  or revoked sooner.       Influenza A by PCR NEGATIVE NEGATIVE Final   Influenza B by PCR NEGATIVE NEGATIVE Final    Comment: (NOTE) The Xpert Xpress SARS-CoV-2/FLU/RSV plus assay is intended as an aid in the diagnosis of influenza from Nasopharyngeal swab specimens and should not be used as a sole basis for treatment. Nasal washings and aspirates are unacceptable for Xpert Xpress SARS-CoV-2/FLU/RSV testing.  Fact Sheet for  Patients: BloggerCourse.com  Fact Sheet for Healthcare Providers: SeriousBroker.it  This test is not yet approved or cleared by the [place] FDA and has been authorized for detection and/or diagnosis of SARS-CoV-2 by FDA under an Emergency Use Authorization (EUA). This EUA will remain in effect (meaning this test can be used) for the duration of the COVID-19 declaration under Section 564(b)(1) of the Act, 21 U.S.C. section 360bbb-3(b)(1), unless the authorization is terminated or revoked.  Performed at [facility], [address], [place], [place] [address]       Radiology Studies: CT Head Wo Contrast  Result Date: 12/31/2020 CLINICAL DATA:  Headache with blurred vision.  Hypertension EXAM: CT HEAD WITHOUT CONTRAST TECHNIQUE: Contiguous axial images were obtained from the base of the skull through the vertex without intravenous contrast. COMPARISON:  None. FINDINGS: Brain: The ventricles and sulci are normal in size and configuration. There is no intracranial mass, hemorrhage, extra-axial fluid collection, or midline shift. There is a focal area of decreased attenuation in the posterior mid right occipital lobe consistent with a small suspected recent infarct in the right occipital lobe posteriorly. Elsewhere brain parenchyma appears unremarkable. Vascular: No hyperdense vessel. No appreciable vascular calcification. Skull: Bony calvarium appears intact. There is a midline frontal exostosis measuring 1.3 x 0.6 cm, a benign finding. Sinuses/Orbits: Visualized paranasal sinuses are clear. Orbits appear symmetric bilaterally. Other: Mastoid air cells clear. IMPRESSION: Focal area of decreased attenuation in the posterior aspect of the mid right occipital lobe, felt to represent a recent and potentially acute focal infarct in this area. On CT, this area of apparent infarct measures approximately 1.5 x 1.5 cm. Elsewhere brain  parenchyma appears unremarkable. No mass or hemorrhage. Electronically Signed   By: Bretta Bang III M.D.   On: 12/31/2020 17:36   MR ANGIO HEAD WO CONTRAST  Result Date: 01/01/2021 CLINICAL DATA:  Visual changes beginning 1 week ago. Occipital infarct on CT. EXAM: MRI HEAD WITHOUT CONTRAST MRA HEAD WITHOUT CONTRAST MRA NECK WITHOUT CONTRAST TECHNIQUE: Multiplanar, multiecho pulse sequences of the brain and surrounding structures were obtained without intravenous contrast. Angiographic images of the Circle of Willis were obtained using MRA technique without intravenous contrast. Angiographic images of the neck were obtained using MRA technique without intravenous contrast. Carotid stenosis measurements (when applicable) are obtained utilizing NASCET criteria, using the distal internal carotid diameter as the denominator. COMPARISON:  Head CT 12/31/2020 FINDINGS: MRI HEAD FINDINGS Brain: There is a 1.5 cm acute or early subacute infarct involving cortex and subcortical white matter in the right occipital lobe. The brain is normal in signal elsewhere. No intracranial hemorrhage, mass, midline shift, or extra-axial fluid collection is identified. The ventricles and sulci are normal. Vascular: Major intracranial vascular flow voids  are preserved. Skull and upper cervical spine: Unremarkable bone marrow signal. 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 Sinuses/Orbits: Unremarkable orbits. Mild bilateral ethmoid air cell mucosal thickening. Clear mastoid air cells. Other: None. MRA HEAD FINDINGS The study is mildly motion degraded. The intracranial vertebral arteries are widely patent to the basilar with the left being mildly dominant. Patent PICA, AICA, and SCA origins are seen bilaterally. The basilar artery is widely patent. Posterior communicating arteries are not clearly identified and may be diminutive or absent. Both PCAs are patent without evidence of a significant proximal stenosis. The internal carotid  arteries are patent from skull base to carotid termini. Focal partial signal loss involving the right greater than left ICAs at the level of the anterior genu may be artifactual due to motion and vessel orientation or indicative of underlying stenoses with a severe stenosis possible on the right. Of note, no significant atherosclerotic calcification is evident on the comparison CT which would favor the appearance on MRA being artifactual. ACAs and MCAs are patent without evidence of a proximal branch occlusion or flow limiting proximal stenosis. The right A1 segment is hypoplastic and poorly visualized. No aneurysm is identified. MRA NECK FINDINGS Assessment is limited by intermittent mild to moderate motion artifact and noncontrast technique. There is a standard 3 vessel aortic arch. The common carotid and cervical internal carotid arteries are patent without evidence of a dissection or definite significant stenosis allowing for intermittent motion artifact. The vertebral arteries are patent with antegrade flow bilaterally. The proximal V1 segments are poorly seen due to motion artifact. No significant stenosis or dissection is evident involving either vertebral artery more distally in the neck. IMPRESSION: 1. Small acute or early subacute right occipital infarct. 2. Artifact versus stenosis at the anterior genu of the right greater than left internal carotid arteries with artifact favored. Otherwise negative head MRA. 3. Motion degraded neck MRA. Patent cervical carotid and vertebral arteries. Electronically Signed   By: Sebastian Ache M.D.   On: 01/01/2021 08:51   MR ANGIO NECK WO CONTRAST  Result Date: 01/01/2021 CLINICAL DATA:  Visual changes beginning 1 week ago. Occipital infarct on CT. EXAM: MRI HEAD WITHOUT CONTRAST MRA HEAD WITHOUT CONTRAST MRA NECK WITHOUT CONTRAST TECHNIQUE: Multiplanar, multiecho pulse sequences of the brain and surrounding structures were obtained without intravenous contrast.  Angiographic images of the Circle of Willis were obtained using MRA technique without intravenous contrast. Angiographic images of the neck were obtained using MRA technique without intravenous contrast. Carotid stenosis measurements (when applicable) are obtained utilizing NASCET criteria, using the distal internal carotid diameter as the denominator. COMPARISON:  Head CT 12/31/2020 FINDINGS: MRI HEAD FINDINGS Brain: There is a 1.5 cm acute or early subacute infarct involving cortex and subcortical white matter in the right occipital lobe. The brain is normal in signal elsewhere. No intracranial hemorrhage, mass, midline shift, or extra-axial fluid collection is identified. The ventricles and sulci are normal. Vascular: Major intracranial vascular flow voids are preserved. Skull and upper cervical spine: Unremarkable bone marrow signal. 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 Sinuses/Orbits: Unremarkable orbits. Mild bilateral ethmoid air cell mucosal thickening. Clear mastoid air cells. Other: None. MRA HEAD FINDINGS The study is mildly motion degraded. The intracranial vertebral arteries are widely patent to the basilar with the left being mildly dominant. Patent PICA, AICA, and SCA origins are seen bilaterally. The basilar artery is widely patent. Posterior communicating arteries are not clearly identified and may be diminutive or absent. Both PCAs are patent without evidence of  a significant proximal stenosis. The internal carotid arteries are patent from skull base to carotid termini. Focal partial signal loss involving the right greater than left ICAs at the level of the anterior genu may be artifactual due to motion and vessel orientation or indicative of underlying stenoses with a severe stenosis possible on the right. Of note, no significant atherosclerotic calcification is evident on the comparison CT which would favor the appearance on MRA being artifactual. ACAs and MCAs are patent without evidence of a  proximal branch occlusion or flow limiting proximal stenosis. The right A1 segment is hypoplastic and poorly visualized. No aneurysm is identified. MRA NECK FINDINGS Assessment is limited by intermittent mild to moderate motion artifact and noncontrast technique. There is a standard 3 vessel aortic arch. The common carotid and cervical internal carotid arteries are patent without evidence of a dissection or definite significant stenosis allowing for intermittent motion artifact. The vertebral arteries are patent with antegrade flow bilaterally. The proximal V1 segments are poorly seen due to motion artifact. No significant stenosis or dissection is evident involving either vertebral artery more distally in the neck. IMPRESSION: 1. Small acute or early subacute right occipital infarct. 2. Artifact versus stenosis at the anterior genu of the right greater than left internal carotid arteries with artifact favored. Otherwise negative head MRA. 3. Motion degraded neck MRA. Patent cervical carotid and vertebral arteries. Electronically Signed   By: Sebastian AcheAllen  Grady M.D.   On: 01/01/2021 08:51   MR BRAIN WO CONTRAST  Result Date: 01/01/2021 CLINICAL DATA:  Visual changes beginning 1 week ago. Occipital infarct on CT. EXAM: MRI HEAD WITHOUT CONTRAST MRA HEAD WITHOUT CONTRAST MRA NECK WITHOUT CONTRAST TECHNIQUE: Multiplanar, multiecho pulse sequences of the brain and surrounding structures were obtained without intravenous contrast. Angiographic images of the Circle of Willis were obtained using MRA technique without intravenous contrast. Angiographic images of the neck were obtained using MRA technique without intravenous contrast. Carotid stenosis measurements (when applicable) are obtained utilizing NASCET criteria, using the distal internal carotid diameter as the denominator. COMPARISON:  Head CT 12/31/2020 FINDINGS: MRI HEAD FINDINGS Brain: There is a 1.5 cm acute or early subacute infarct involving cortex and  subcortical white matter in the right occipital lobe. The brain is normal in signal elsewhere. No intracranial hemorrhage, mass, midline shift, or extra-axial fluid collection is identified. The ventricles and sulci are normal. Vascular: Major intracranial vascular flow voids are preserved. Skull and upper cervical spine: Unremarkable bone marrow signal. 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 Sinuses/Orbits: Unremarkable orbits. Mild bilateral ethmoid air cell mucosal thickening. Clear mastoid air cells. Other: None. MRA HEAD FINDINGS The study is mildly motion degraded. The intracranial vertebral arteries are widely patent to the basilar with the left being mildly dominant. Patent PICA, AICA, and SCA origins are seen bilaterally. The basilar artery is widely patent. Posterior communicating arteries are not clearly identified and may be diminutive or absent. Both PCAs are patent without evidence of a significant proximal stenosis. The internal carotid arteries are patent from skull base to carotid termini. Focal partial signal loss involving the right greater than left ICAs at the level of the anterior genu may be artifactual due to motion and vessel orientation or indicative of underlying stenoses with a severe stenosis possible on the right. Of note, no significant atherosclerotic calcification is evident on the comparison CT which would favor the appearance on MRA being artifactual. ACAs and MCAs are patent without evidence of a proximal branch occlusion or flow limiting proximal stenosis.  The right A1 segment is hypoplastic and poorly visualized. No aneurysm is identified. MRA NECK FINDINGS Assessment is limited by intermittent mild to moderate motion artifact and noncontrast technique. There is a standard 3 vessel aortic arch. The common carotid and cervical internal carotid arteries are patent without evidence of a dissection or definite significant stenosis allowing for intermittent motion artifact. The  vertebral arteries are patent with antegrade flow bilaterally. The proximal V1 segments are poorly seen due to motion artifact. No significant stenosis or dissection is evident involving either vertebral artery more distally in the neck. IMPRESSION: 1. Small acute or early subacute right occipital infarct. 2. Artifact versus stenosis at the anterior genu of the right greater than left internal carotid arteries with artifact favored. Otherwise negative head MRA. 3. Motion degraded neck MRA. Patent cervical carotid and vertebral arteries. Electronically Signed   By: Sebastian Ache M.D.   On: 01/01/2021 08:51   DG CHEST PORT 1 VIEW  Result Date: 12/31/2020 CLINICAL DATA:  Acute migraine blurred vision, elevated blood pressure EXAM: PORTABLE CHEST 1 VIEW COMPARISON:  Report from chest radiograph December 17, 2006 however no imaging available for review at time of dictation. FINDINGS: The heart size and mediastinal contours are within normal limits. Pulmonary hyperinflation with chronic appearing bronchitic lung changes. No focal consolidation. No visible pleural effusion or pneumothorax. The visualized skeletal structures are unremarkable. IMPRESSION: 1. No acute cardiopulmonary findings. 2. Pulmonary hyperinflation with chronic appearing bronchitic lung changes suggestive of chronic obstructive pulmonary disease. Electronically Signed   By: Maudry Mayhew MD   On: 12/31/2020 21:42   VAS [place] LOWER EXTREMITY VENOUS (DVT)  Result Date: 01/01/2021  Lower Venous DVT Study Patient Name:  RHESA FORSBERG  Date of Exam:   01/01/2021 Medical Rec #: [id number]       Accession #:    [id number] Date of Birth: [birth year]       Patient Gender: F Patient Age:   69Y Exam Location:  [facility] Procedure:      VAS [place] LOWER EXTREMITY VENOUS (DVT) Referring Phys: [id number] Marvel Plan --------------------------------------------------------------------------------  Indications: Stroke.  Comparison Study: No prior study Performing  Technologist: Gertie Fey MHA, RDMS, RVT, RDCS  Examination Guidelines: A complete evaluation includes B-mode imaging, spectral Doppler, color Doppler, and power Doppler as needed of all accessible portions of each vessel. Bilateral testing is considered an integral part of a complete examination. Limited examinations for reoccurring indications may be performed as noted. The reflux portion of the exam is performed with the patient in reverse Trendelenburg.  +---------+---------------+---------+-----------+----------+--------------+ RIGHT    CompressibilityPhasicitySpontaneityPropertiesThrombus Aging +---------+---------------+---------+-----------+----------+--------------+ CFV      Full           Yes      Yes                                 +---------+---------------+---------+-----------+----------+--------------+ SFJ      Full                                                        +---------+---------------+---------+-----------+----------+--------------+ FV Prox  Full                                                        +---------+---------------+---------+-----------+----------+--------------+  FV Mid   Full                                                        +---------+---------------+---------+-----------+----------+--------------+ FV DistalFull                                                        +---------+---------------+---------+-----------+----------+--------------+ PFV      Full                                                        +---------+---------------+---------+-----------+----------+--------------+ POP      Full           Yes      Yes                                 +---------+---------------+---------+-----------+----------+--------------+ PTV      Full                                                        +---------+---------------+---------+-----------+----------+--------------+ PERO     Full                                                         +---------+---------------+---------+-----------+----------+--------------+   +---------+---------------+---------+-----------+----------+--------------+ LEFT     CompressibilityPhasicitySpontaneityPropertiesThrombus Aging +---------+---------------+---------+-----------+----------+--------------+ CFV      Full           Yes      Yes                                 +---------+---------------+---------+-----------+----------+--------------+ SFJ      Full                                                        +---------+---------------+---------+-----------+----------+--------------+ FV Prox  Full                                                        +---------+---------------+---------+-----------+----------+--------------+ FV Mid   Full                                                        +---------+---------------+---------+-----------+----------+--------------+  FV DistalFull                                                        +---------+---------------+---------+-----------+----------+--------------+ PFV      Full                                                        +---------+---------------+---------+-----------+----------+--------------+ POP      Full           Yes      Yes                                 +---------+---------------+---------+-----------+----------+--------------+ PTV      Full                                                        +---------+---------------+---------+-----------+----------+--------------+ PERO     Full                                                        +---------+---------------+---------+-----------+----------+--------------+     Summary: RIGHT: - There is no evidence of deep vein thrombosis in the lower extremity.  - No cystic structure found in the popliteal fossa.  LEFT: - There is no evidence of deep vein thrombosis in the lower extremity.  - No cystic  structure found in the popliteal fossa.  *See table(s) above for measurements and observations.    Preliminary     Scheduled Meds: . [START ON 01/02/2021] aspirin EC  81 mg Oral Daily  . atorvastatin  80 mg Oral Daily  . [START ON 01/02/2021] clopidogrel  75 mg Oral Daily  . pantoprazole  40 mg Oral Daily  . venlafaxine XR  75 mg Oral Q breakfast   Continuous Infusions:   LOS: 0 days   Time spent: 25 minutes.  Tyrone Nine, MD [facility] [email or web address] 01/01/2021, 2:25 PM

## 2021-01-01 NOTE — Evaluation (Signed)
Speech Language Pathology Evaluation Patient Details Name: Yolanda Gibbs MRN: [id number] DOB: Mar 21, 1951 Today's Date: 01/01/2021 Time: 9798-9211 SLP Time Calculation (min) (ACUTE ONLY): 15.48 min  Problem List:  Patient Active Problem List   Diagnosis Date Noted  . Essential hypertension 01/01/2021  . GERD (gastroesophageal reflux disease) 01/01/2021  . CVA (cerebral vascular accident) (HCC) 12/31/2020  . Hot flashes 12/08/2015  . Laryngopharyngeal reflux (LPR) 05/24/2015  . Hyperlipidemia 10/27/2014  . Preventative health care 10/26/2014  . History of colonic polyps 08/29/2014  . Depression 08/29/2014  . Allergic rhinitis 09/21/2007  . ESOPHAGEAL REFLUX 09/21/2007  . COUGH 09/21/2007   Past Medical History:  Past Medical History:  Diagnosis Date  . Allergy    seasonal  . Depression   . GERD (gastroesophageal reflux disease)   . History of chicken pox   . History of colon polyps    benign  . Hypertension    Past Surgical History: History reviewed. No pertinent surgical history. HPI:  Pt is a 70 y.o. female who presented with c/o blurry vision in L eye x1 week.MRI brain 5/16: Small acute or early subacute right occipital infarct. PMHx significant for HTN, HLD, depression and migraines.   Assessment / Plan / Recommendation Clinical Impression  Pt participated in speech/language/cognition evaluation with her husband and daughter present. 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 Pt reported that she has been having difficulty with memory for the past 5-6 years and pt's husband stated that the last week has been significantly worse. Per the pt, she believes her cognition is currently "slow". She denied any baseline or acute deficits in speech/langauge and no deficits were noted in these areas during the evaluation. The [facility] Mental Status Examination was completed to evaluate the pt's  cognitive-linguistic skills. She achieved a score of 25/30 which is below the normal limits of 27 or more out of 30 and is suggestive of a mild impairment. She exhibited difficulty in the areas of memory and executive function. Skilled SLP services are recommended at the next venue of care to target cognitive-linguistic function.    SLP Assessment  SLP Recommendation/Assessment: All further Speech Lanaguage Pathology  needs can be addressed in the next venue of care SLP Visit Diagnosis: Cognitive communication deficit (R41.841)    Follow Up Recommendations  Outpatient SLP    Frequency and Duration           SLP Evaluation Cognition  Overall Cognitive Status: Impaired/Different from baseline Arousal/Alertness: Awake/alert Orientation Level: Oriented X4 Attention: Focused;Sustained Focused Attention: Appears intact Sustained Attention: Appears intact Memory: Impaired Memory Impairment: Retrieval deficit;Decreased recall of new information (Immediate: 5/5; delayed: 2/5; with cues: 2/3) Awareness: Appears intact Problem Solving: Appears intact Executive Function: Reasoning;Sequencing;Organizing Reasoning: Appears intact Sequencing: Appears intact (Clock drawing: 4/4) Organizing: Appears intact (Backward digit: 3/3 with additional processing time) Safety/Judgment: Appears intact       Comprehension  Auditory Comprehension Overall Auditory Comprehension: Appears within functional limits for tasks assessed Yes/No Questions: Within Functional Limits Commands: Within Functional Limits Conversation: Complex    Expression Expression Primary Mode of Expression: Verbal Verbal Expression Overall Verbal Expression: Appears within functional limits for tasks assessed Initiation: No impairment Level of Generative/Spontaneous Verbalization: Conversation Repetition: No impairment Naming: No impairment Pragmatics: No impairment Written Expression Dominant Hand: Right   Oral / Motor   Oral Motor/Sensory Function Overall Oral Motor/Sensory Function: Within functional limits Motor Speech Overall Motor Speech:  Appears within functional limits for tasks assessed Respiration: Within functional limits Phonation: Normal Resonance: Within functional limits Articulation: Within functional limitis Intelligibility: Intelligible Motor Planning: Witnin functional limits Motor Speech Errors: Not applicable   Yolanda Gibbs I. Vear Clock, MS, CCC-SLP Acute Rehabilitation Services Office number [phone] Pager [phone]                    Scheryl Marten 01/01/2021, 11:25 AM

## 2021-01-01 NOTE — Evaluation (Signed)
Occupational Therapy Evaluation Patient Details Name: Yolanda Gibbs MRN: [id number] DOB: 05-25-1951 Today's Date: 01/01/2021    History of Present Illness 70 y.o. female presenting with c/o blurry vision in L eye x1 week. CT (+) Gibbs occipital lobe infarct. MRI pending. PMHx significant for HTN, HLD, depression and migraines.   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 Patient currently functioning at baseline demonstrating observed ADLs including UB/LB dressing, toileting/hygiene/clothing management, and grooming standing at sink level with I. Patient with continued blurry vision in both eyes reporting blurriness greatest in L eye. Written and verbal education provided on compensatory strategies with patient expressing verbal understanding. Patient does not require continued acute occupational therapy services with OT to sign off at this time. Patient is safe to return home with family.     Follow Up Recommendations  No OT follow up;Other (comment) (Patient has already been assessed by her eye doctor.)    Equipment Recommendations  None recommended by OT    Recommendations for Other Services       Precautions / Restrictions Precautions Precautions: None Restrictions Weight Bearing Restrictions: No      Mobility Bed Mobility Overal bed mobility: Independent                  Transfers Overall transfer level: Independent Equipment used: None                  Balance Overall balance assessment: Independent                                         ADL either performed or assessed with clinical judgement   ADL Overall ADL's : Independent                                       General ADL Comments: Demonstrates I with UB/LB dressing, toileting, and grooming standing at sink level this date.     Vision  Baseline Vision/History: Wears glasses Wears Glasses: Reading only Patient Visual Report: Blurring of vision Vision Assessment?: Yes Eye Alignment: Within Functional Limits Ocular Range of Motion: Within Functional Limits Alignment/Gaze Preference: Within Defined Limits Tracking/Visual Pursuits: Able to track stimulus in all quads without difficulty;Other (comment) (Noted blurriness in all visual fields) Saccades: Within functional limits     Perception     Praxis      Pertinent Vitals/Pain Pain Assessment: No/denies pain     Hand Dominance Right   Extremity/Trunk Assessment Upper Extremity Assessment Upper Extremity Assessment: Overall WFL for tasks assessed   Lower Extremity Assessment Lower Extremity Assessment: Overall WFL for tasks assessed   Cervical / Trunk Assessment Cervical / Trunk Assessment: Normal   Communication Communication Communication: No difficulties   Cognition Arousal/Alertness: Awake/alert Behavior During Therapy: WFL for tasks assessed/performed Overall Cognitive Status: Within Functional Limits for tasks assessed                                     General Comments  Continued blurry vision worst in L visual field. Education provided on compensatory vision strategies. Patient able to return demo and expresses verbal understanding.  Exercises     Shoulder Instructions      Home Living Family/patient expects to be discharged to:: Private residence Living Arrangements: Spouse/significant other Available Help at Discharge: Family;Available 24 hours/day Type of Home: House Home Access: Stairs to enter [employer] of Steps: 3-4 Entrance Stairs-Rails: Left (Ascending) Home Layout: One level     Bathroom Shower/Tub: Producer, television/film/video: Handicapped height     Home Equipment: None          Prior Functioning/Environment Level of Independence: Independent        Comments: Independent with  ADLs/IADLs; drives; 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 OT Goal Formulation: With patient  OT Frequency:     Barriers to D/C:            Co-evaluation              AM-PAC OT "6 Clicks" Daily Activity     Outcome Measure Help from another person eating meals?: None Help from another person taking care of personal grooming?: None Help from another person toileting, which includes using toliet, bedpan, or urinal?: None Help from another person bathing (including washing, rinsing, drying)?: None Help from another person to put on and taking off regular upper body clothing?: None Help from another person to put on and taking off regular lower body clothing?: None 6 Click Score: 24   End of Session Equipment Utilized During Treatment: Gait belt Nurse Communication: Mobility status  Activity Tolerance: Patient tolerated treatment well Patient left: in bed;with call bell/phone within reach;with family/visitor present  OT Visit Diagnosis: Low vision, both eyes (H54.2)                Time: 9326-7124 OT Time Calculation (min): 20 min Charges:  OT General Charges $OT Visit: 1 Visit OT Evaluation $OT Eval Low Complexity: 1 Low  Yolanda Gibbs H. OTR/L Supplemental OT, Department of rehab services [phone]  Yolanda Gibbs H. 01/01/2021, 8:41 AM

## 2021-01-01 NOTE — Progress Notes (Incomplete)
  Echocardiogram 2D Echocardiogram has been performed.  Yolanda Gibbs 01/01/2021, 9:17 AM

## 2021-01-01 NOTE — Progress Notes (Signed)
PT Cancellation Note  Patient Details Name: Yolanda Gibbs MRN: [id number] DOB: Aug 15, 1951   Cancelled Treatment:    Reason Eval/Treat Not Completed: PT screened, no needs identified, will sign off  OT has evaluated patient and she is independent with all mobility with no PT needs identified.    Jerolyn Center, PT Pager [phone]   Zena Amos 01/01/2021, 9:46 AM

## 2021-01-01 NOTE — Plan of Care (Signed)
  Problem: Self-Care: Goal: Ability to participate in self-care as condition permits will improve Outcome: Progressing   Problem: Self-Care: Goal: Ability to communicate needs accurately will improve Outcome: Progressing   Problem: Ischemic Stroke/TIA Tissue Perfusion: Goal: Complications of ischemic stroke/TIA will be minimized Outcome: Progressing

## 2021-01-01 NOTE — Progress Notes (Addendum)
STROKE TEAM PROGRESS NOTE   INTERVAL HISTORY No acute events   Today she reports her symptoms are unchanged and she is without new concerns.  She continues to describe her vision as "blurry" but when pressed for detail she then describes missing "part of the picture" with both eyes open or either eye closed. She denies "zigzag" lines today. She also denies new numbnes/tingling/loss of sensation, weakness, difficulty talking, headache or balance difficulty. She walked to the bathroom with initial unsteadiness that resolved after being up for a minute or two.  We discussed her stroke diagnosis, plan of care, ongoing diagnostic work up, possible need for TEE (she was agreeable if needed), and plan of care. We discussed s/s of stroke and reasons to seek emergent care in the future. Her questions were answered.   Vitals:   01/01/21 0135 01/01/21 0425 01/01/21 0705 01/01/21 0818  BP: 140/83 (!) 147/84  139/86  Pulse: 72 73  75  Resp:    16  Temp: 97.8 F (36.6 C) 97.7 F (36.5 C)  98.3 F (36.8 C)  TempSrc: Oral Oral    SpO2: 96% 98% 96% 96%  Weight:      Height:       CBC:  Recent Labs  Lab 12/31/20 1638  WBC 6.2  NEUTROABS 2.9  HGB 14.0  HCT 43.9  MCV 92.6  PLT 322   Basic Metabolic Panel:  Recent Labs  Lab 12/31/20 1638  NA 138  K 3.8  CL 103  CO2 25  GLUCOSE 93  BUN 16  CREATININE 0.56  CALCIUM 9.4   Lipid Panel: Pending HgbA1c:  Recent Labs  Lab 01/01/21 0015  HGBA1C 6.2*   Urine Drug Screen: No results for input(s): LABOPIA, COCAINSCRNUR, LABBENZ, AMPHETMU, THCU, LABBARB in the last 168 hours.  Alcohol Level No results for input(s): ETH in the last 168 hours.  IMAGING past 24 hours MRA 1. Small acute or early subacute right occipital infarct. 2. Artifact versus stenosis at the anterior genu of the right greater than left internal carotid arteries with artifact favored. Otherwise negative head MRA. 3. Motion degraded neck MRA. Patent cervical carotid and  vertebral arteries.  MRI 1. Small acute or early subacute right occipital infarct. 2. Artifact versus stenosis at the anterior genu of the right greater than left internal carotid arteries with artifact favored. Otherwise negative head MRA. 3. Motion degraded neck MRA. Patent cervical carotid and vertebral arteries.  CTH Focal area of decreased attenuation in the posterior aspect of the mid right occipital lobe, felt to represent a recent and potentially acute focal infarct in this area. On CT, this area of apparent infarct measures approximately 1.5 x 1.5 cm. Elsewhere brain parenchyma appears unremarkable. No mass or hemorrhage.\  PHYSICAL EXAM Constitutional: Appears well-developed and well-nourished.  Psych: Affect appropriate to situation Eyes: No scleral injection HENT: No OP obstruction MSK: no joint deformities.  Cardiovascular: Normal rate and regular rhythm.  Respiratory: Effort normal, non-labored breathing GI: Soft.  No distension. There is no tenderness.  Skin: WDI  Neuro: Mental Status: Patient is awake, alert, oriented to person, place, month, year, and situation. Patient is able to give a clear and coherent history. No signs of aphasia or neglect Cranial Nerves: II: Visual Fields are full. Pupils are equal, round, and reactive to light.   III,IV, VI: EOMI without ptosis or diploplia.  V: Facial sensation is symmetric to temperature VII: Facial movement is symmetric.  VIII: hearing is intact to voice X: Uvula elevates  symmetrically XI: Shoulder shrug is symmetric. XII: tongue is midline without atrophy or fasciculations.  Motor: Tone is normal. Bulk is normal. 5/5 strength was present in all four extremities.  Sensory: Sensation is symmetric to light touch and temperature in the arms and legs. Plantars: Toes are downgoing bilaterally.  Cerebellar: FNF and HKS are intact bilaterally  ASSESSMENT/PLAN Yolanda Gibbs is a 70 y.o. female with a history  of hypertension and migraine who presents with visual changes started last Sunday.  She states that she woke up with the symptoms.  She describes the visual change as "zigzag lines" that are more prominent on the left.Given that it was not improving, she saw an optometrist who stated that everything they saw looked normal.  Her blood pressure was elevated so she sought care at her PCPs office.  He prescribed her losartan, but her symptoms did not improve.  Due to her continued symptoms, she sought care at [facility] where a CT was performed demonstrating a subacute occipital stroke.  Stroke - right occipital small stroke with unknown etiology. Stroke work up underway.   Code Stroke: Focal area of decreased attenuation in the posterior aspect of the mid right occipital lobe, felt to represent a recent and potential acute focal infarct in this area  MRI: Small acute or early subacute right occipital infarct.  MRA: . Small acute or early subacute right occipital infarct.  2D Echo complete with read pending  VTE prophylaxis - per primary team, ambulatory    Diet   Diet Heart Room service appropriate? Yes; Fluid consistency: Thin    On DAPT with plavix and ASA x 3 weeks then ASA alone at present (final plan pending work up completion).   Therapy recommendations: Cleared for home by PT/OT with no needs   Disposition:  Home   Hypertension . Permissive hypertension (OK if < 220/120) but gradually normalize in 5-7 days . Long-term BP goal normotensive  Hyperlipidemia  Home meds:  None  LDL: Pending  Statin plan pending result   Other Stroke Risk Factors  Advanced Age >/= 24   Current ETOH use, 3-4 per week. Advised to limit to one drink per day.   Family hx stroke (father)  Coronary artery disease  Migraines  Other Active Problems  This plan of care was directed by Dr. Roda Shutters.  Shon Hale, NP-C, .MSN Hospital day # 0   ATTENDING NOTE: I reviewed above  note and agree with the assessment and plan. Pt was seen and examined.   70 year old female with history of migraine admitted for left visual field visual disturbance.  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  In the afternoon, headache resolved, zigzag line resolved, however left visual field become blurry.  She went to see ophthalmologist last week was told nothing abnormal.  Therefore, patient had CT done which showed right occipital small infarct.  MRI also confirmed right occipital small infarct.  MRA head and neck negative.  LDL 89, A1c 6.2.  LE venous Doppler negative for DVT.  EF 60 to 65%.  On exam, patient neurologically intact, subjective left visual field blurred vision, however no visual field deficit.  Etiology for patient stroke unclear at this time, could be due to migraine versus cardioembolic source.  Recommend TEE and loop recorder for further evaluation.  Currently on aspirin 81 and Plavix 75 DAPT for 3  weeks and then aspirin alone.  Continue Lipitor 40.  Will follow.  Marvel Plan, MD PhD Stroke Neurology 01/01/2021 4:14 PM     To contact Stroke Continuity provider, please refer to [email or web address]. After hours, contact General Neurology

## 2021-01-01 NOTE — Progress Notes (Signed)
    [facility] has been requested to perform a transesophageal echocardiogram on Ms. Berryman for CVA.  After careful review of history and examination, the risks and benefits of transesophageal echocardiogram have been explained including risks of esophageal damage, perforation (1:10,000 risk), bleeding, pharyngeal hematoma as well as other potential complications associated with conscious sedation including aspiration, arrhythmia, respiratory failure and death. Alternatives to treatment were discussed, questions were answered. Patient is willing to proceed.  TEE - Dr. Bjorn Pippin @ 1000. NPO after midnight. Meds with sips.   Manson Passey, PA-C 01/01/2021 4:22 PM

## 2021-01-01 NOTE — Consult Note (Signed)
Neurology Consultation Reason for Consult: Stroke Referring Physician: Adela Glimpse, a  CC: Visual change  History is obtained from: Patient  HPI: Yolanda Gibbs is a 70 y.o. female with a history of hypertension and migraine who presents with visual changes started last Sunday.  She states that she woke up with the symptoms.  She describes the visual change as "zigzag lines" that are more prominent on the left.  She used to get migraines when she was younger and attributed to migraine initially.  Given that it was not improving, she saw an optometrist who stated that everything they saw looked normal.  Her blood pressure was elevated so she sought care at her PCPs office.  He prescribed her losartan, but her symptoms did not improve.  Due to her continued symptoms, she sought care at [facility] where a CT was performed demonstrating a subacute occipital stroke.   LKW: Sunday 5/8 tpa given?: no, out of window    ROS: A 14 point ROS was performed and is negative except as noted in the HPI.   Past Medical History:  Diagnosis Date  . Allergy    seasonal  . Depression   . GERD (gastroesophageal reflux disease)   . History of chicken pox   . History of colon polyps    benign  . Hypertension      Family History  Problem Relation Age of Onset  . Arthritis Mother   . CAD Mother   . AAA (abdominal aortic aneurysm) Mother   . Cancer Mother        lung  . Stroke Father   . Cancer Maternal Aunt        breast  . Arthritis Maternal Grandmother   . Cancer Maternal Grandfather        prostate  . Depression Paternal Grandmother      Social History:  reports that she has never smoked. She has never used smokeless tobacco. She reports current alcohol use of about 3.0 - 4.0 standard drinks of alcohol per week. She reports that she does not use drugs.   Exam: Current vital signs: BP 140/83 (BP Location: Left Arm)   Pulse 72   Temp 97.8 F (36.6 C) (Oral)   Resp 18   Ht 5'  4" (1.626 m)   Wt 68.9 kg   LMP 08/19/2004   SpO2 96%   BMI 26.09 kg/m  Vital signs in last 24 hours: Temp:  [97.8 F (36.6 C)-98.7 F (37.1 C)] 97.8 F (36.6 C) (05/16 0135) Pulse Rate:  [64-85] 72 (05/16 0135) Resp:  [14-19] 18 (05/15 2322) BP: (140-159)/(80-100) 140/83 (05/16 0135) SpO2:  [94 %-100 %] 96 % (05/16 0135) Weight:  [68.9 kg] 68.9 kg (05/15 1243)   Physical Exam  Constitutional: Appears well-developed and well-nourished.  Psych: Affect appropriate to situation Eyes: No scleral injection HENT: No OP obstruction MSK: no joint deformities.  Cardiovascular: Normal rate and regular rhythm.  Respiratory: Effort normal, non-labored breathing GI: Soft.  No distension. There is no tenderness.  Skin: WDI  Neuro: Mental Status: Patient is awake, alert, oriented to person, place, month, year, and situation. Patient is able to give a clear and coherent history. No signs of aphasia or neglect Cranial Nerves: II: Visual Fields are full. Pupils are equal, round, and reactive to light.   III,IV, VI: EOMI without ptosis or diploplia.  V: Facial sensation is symmetric to temperature VII: Facial movement is symmetric.  VIII: hearing is intact to voice X: Uvula  elevates symmetrically XI: Shoulder shrug is symmetric. XII: tongue is midline without atrophy or fasciculations.  Motor: Tone is normal. Bulk is normal. 5/5 strength was present in all four extremities.  Sensory: Sensation is symmetric to light touch and temperature in the arms and legs. Plantars: Toes are downgoing bilaterally.  Cerebellar: FNF and HKS are intact bilaterally   I have reviewed labs in epic and the results pertinent to this consultation are: BMP-normal CBC normal  I have reviewed the images obtained: CT head- subacute small stroke in the right occipital lobe.   Impression: 70 yo F with right occipital stroke. She is being admitted for secondary stroke risk factor modification.    Recommendations: - HgbA1c, fasting lipid panel - MRI, MRA  of the brain without contrast - Frequent neuro checks - Echocardiogram - Carotid dopplers - Prophylactic therapy-Antiplatelet med: Aspirin - dose 81mg  and plavix 75mg  daily x 3weeks.  - Risk factor modification - Telemetry monitoring - PT consult, OT consult, Speech consult - Stroke team to follow  , MD [facility] [phone]  If 7pm- 7am, please page neurology on call as listed in AMION.    Ritta Slot, MD [facility] [phone]  If 7pm- 7am, please page neurology on call as listed in AMION.

## 2021-01-01 NOTE — H&P (View-Only) (Signed)
PROGRESS NOTE  Yolanda Gibbs  MRN:[id number] DOB: 06/01/1951 DOA: 12/31/2020 PCP: O'Sullivan, [name], NP   Brief Narrative: Yolanda Gibbs is a 70 y.o. female with a history of occipital migraines, idiopathic chronic cough who presented to the ED 5/15 with a week of vision changes described as "blurry vision" on the left visual fields present with both eyes and either eye alone that was not consistent with prior migraines. This prompted an optometry visit where no abnormality was found. Subsequently visited PCP where losartan was started for new-onset elevated BP. Despite taking this, there was no change, so she returned to her PCP where losartan dose was increased and patient sent home. She went to [facility] on 5/15 where CT head was positive for early subacute right occipital infarct. Neurology was consulted and work up is ongoing. TEE-loop recorder is planned pending results of TTE today.  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 - Starting DAPT x3 weeks, then single agent  - High-intensity statin. LDL 89 on simvastatin 20mg PTA.  - Permissive HTN. No hx of HTN prior to this event. - PT/OT/SLP without recommendations for further follow up  Prediabetes: HbA1c has been ~6% for the past year treated with dietary modifications. HbA1c 6.2% here.  - PCP follow up advised, consider metformin.   GERD:  - Continue 2 home medications  Chronic cough: Has had extensive work up with ENT, GI, and visited pulmonary in the past without coming to an underlying diagnosis.  - Outpatient follow up recommended.  Depression:  - Continue venlafaxine  DVT prophylaxis: Lovenox Code Status:  Full Family Communication: At bedside Disposition Plan:  Status is: Inpatient  Remains inpatient appropriate because:Ongoing diagnostic testing needed not appropriate for outpatient work up   Dispo: The patient is from: Home              Anticipated d/c is to: Home              Patient currently is not medically stable to d/c.   Difficult to place patient No  Consultants:   Neurology  Procedures:   Echocardiogram  Antimicrobials:  None   Subjective: No new symptoms. Vision change is stable, constant.   Objective: Vitals:   01/01/21 0135 01/01/21 0425 01/01/21 0705 01/01/21 0818  BP: 140/83 (!) 147/84  139/86  Pulse: 72 73  75  Resp:    16  Temp: 97.8 F (36.6 C) 97.7 F (36.5 C)  98.3 F (36.8 C)  TempSrc: Oral Oral    SpO2: 96% 98% 96% 96%  Weight:      Height:        Intake/Output Summary (Last 24 hours) at 01/01/2021 1425 Last data filed at 01/01/2021 1423 Gross per 24 hour  Intake 786.85 ml  Output --  Net 786.85 ml   Filed Weights   12/31/20 1243  Weight: 68.9 kg    Gen: 70 y.o. female in no distress Pulm: Non-labored breathing room air. Clear to auscultation bilaterally.  CV: Regular rate and rhythm. No murmur, rub, or gallop. No JVD, no pedal edema. GI: Abdomen soft, non-tender, non-distended, with normoactive bowel sounds. No organomegaly or masses felt. Ext: Warm, no deformities Skin: No rashes, lesions or ulcers. Left thumb subungual hematoma   is minor. No splinter hemorrhages.  Neuro: Alert and oriented. Left-sided visual field deficit without other CN or peripheral nerve deficits, no dysmetria, dysdiadochokinesia. Psych: Judgement and insight appear normal. Mood & affect appropriate.   Data Reviewed: I have personally reviewed following labs and imaging studies  CBC: Recent Labs  Lab 12/31/20 1638  WBC 6.2  NEUTROABS 2.9  HGB 14.0  HCT 43.9  MCV 92.6  PLT 322   Basic Metabolic Panel: Recent Labs  Lab 12/31/20 1638  NA 138  K  3.8  CL 103  CO2 25  GLUCOSE 93  BUN 16  CREATININE 0.56  CALCIUM 9.4   GFR: Estimated Creatinine Clearance: 65.1 mL/min (by C-G formula based on SCr of 0.56 mg/dL). Liver Function Tests: No results for input(s): AST, ALT, ALKPHOS, BILITOT, PROT, ALBUMIN in the last 168 hours. No results for input(s): LIPASE, AMYLASE in the last 168 hours. No results for input(s): AMMONIA in the last 168 hours. Coagulation Profile: No results for input(s): INR, PROTIME in the last 168 hours. Cardiac Enzymes: No results for input(s): CKTOTAL, CKMB, CKMBINDEX, TROPONINI in the last 168 hours. BNP (last 3 results) No results for input(s): PROBNP in the last 8760 hours. HbA1C: Recent Labs    01/01/21 0015  HGBA1C 6.2*   CBG: No results for input(s): GLUCAP in the last 168 hours. Lipid Profile: Recent Labs    01/01/21 0015  CHOL 190  HDL 84  LDLCALC 89  TRIG 84  CHOLHDL 2.3   Thyroid Function Tests: No results for input(s): TSH, T4TOTAL, FREET4, T3FREE, THYROIDAB in the last 72 hours. Anemia Panel: No results for input(s): VITAMINB12, FOLATE, FERRITIN, TIBC, IRON, RETICCTPCT in the last 72 hours. Urine analysis:    Component Value Date/Time   COLORURINE YELLOW 11/01/2015 1006   APPEARANCEUR CLEAR 11/01/2015 1006   LABSPEC 1.020 11/01/2015 1006   PHURINE 7.0 11/01/2015 1006   GLUCOSEU NEGATIVE 11/01/2015 1006   HGBUR NEGATIVE 11/01/2015 1006   BILIRUBINUR neg 04/24/2016 1703   KETONESUR NEGATIVE 11/01/2015 1006   PROTEINUR neg 04/24/2016 1703   UROBILINOGEN 0.2 04/24/2016 1703   UROBILINOGEN 0.2 11/01/2015 1006   NITRITE neg 04/24/2016 1703   NITRITE NEGATIVE 11/01/2015 1006   LEUKOCYTESUR small (1+) (A) 04/24/2016 1703   Recent Results (from the past 240 hour(s))  Resp Panel by RT-PCR (Flu A&B, Covid) Nasopharyngeal Swab     Status: None   Collection Time: 12/31/20  6:33 PM   Specimen: Nasopharyngeal Swab; Nasopharyngeal(NP) swabs in vial transport medium  Result Value Ref  Range Status   SARS Coronavirus 2 by RT PCR NEGATIVE NEGATIVE Final    Comment: (NOTE) SARS-CoV-2 target nucleic acids are NOT DETECTED.  The SARS-CoV-2 RNA is generally detectable in upper respiratory specimens during the acute phase of infection. The lowest concentration of SARS-CoV-2 viral copies this assay can detect is 138 copies/mL. A negative result does not preclude SARS-Cov-2 infection and should not be used as the sole basis for treatment or other patient management decisions. A negative result may occur with  improper specimen collection/handling, submission of specimen other than nasopharyngeal swab, presence of viral mutation(s) within the areas targeted by this assay, and inadequate number of viral copies(<138 copies/mL). A negative result must be combined with clinical observations, patient history, and epidemiological information. The expected result is Negative.  Fact Sheet for Patients:  https://www.fda.gov/media/152166/download  Fact Sheet for Healthcare Providers:  https://www.fda.gov/media/152162/download  This test is no t yet approved or cleared by the [place] FDA and    has been authorized for detection and/or diagnosis of SARS-CoV-2 by FDA under an Emergency Use Authorization (EUA). This EUA will remain  in effect (meaning this test can be used) for the duration of the COVID-19 declaration under Section 564(b)(1) of the Act, 21 U.S.C.section 360bbb-3(b)(1), unless the authorization is terminated  or revoked sooner.       Influenza A by PCR NEGATIVE NEGATIVE Final   Influenza B by PCR NEGATIVE NEGATIVE Final    Comment: (NOTE) The Xpert Xpress SARS-CoV-2/FLU/RSV plus assay is intended as an aid in the diagnosis of influenza from Nasopharyngeal swab specimens and should not be used as a sole basis for treatment. Nasal washings and aspirates are unacceptable for Xpert Xpress SARS-CoV-2/FLU/RSV testing.  Fact Sheet for  Patients: https://www.fda.gov/media/152166/download  Fact Sheet for Healthcare Providers: https://www.fda.gov/media/152162/download  This test is not yet approved or cleared by the [place] FDA and has been authorized for detection and/or diagnosis of SARS-CoV-2 by FDA under an Emergency Use Authorization (EUA). This EUA will remain in effect (meaning this test can be used) for the duration of the COVID-19 declaration under Section 564(b)(1) of the Act, 21 U.S.C. section 360bbb-3(b)(1), unless the authorization is terminated or revoked.  Performed at [facility] [place], [address] [place], [place] [address]       Radiology Studies: CT Head Wo Contrast  Result Date: 12/31/2020 CLINICAL DATA:  Headache with blurred vision.  Hypertension EXAM: CT HEAD WITHOUT CONTRAST TECHNIQUE: Contiguous axial images were obtained from the base of the skull through the vertex without intravenous contrast. COMPARISON:  None. FINDINGS: Brain: The ventricles and sulci are normal in size and configuration. There is no intracranial mass, hemorrhage, extra-axial fluid collection, or midline shift. There is a focal area of decreased attenuation in the posterior mid right occipital lobe consistent with a small suspected recent infarct in the right occipital lobe posteriorly. Elsewhere brain parenchyma appears unremarkable. Vascular: No hyperdense vessel. No appreciable vascular calcification. Skull: Bony calvarium appears intact. There is a midline frontal exostosis measuring 1.3 x 0.6 cm, a benign finding. Sinuses/Orbits: Visualized paranasal sinuses are clear. Orbits appear symmetric bilaterally. Other: Mastoid air cells clear. IMPRESSION: Focal area of decreased attenuation in the posterior aspect of the mid right occipital lobe, felt to represent a recent and potentially acute focal infarct in this area. On CT, this area of apparent infarct measures approximately 1.5 x 1.5 cm. Elsewhere brain  parenchyma appears unremarkable. No mass or hemorrhage. Electronically Signed   By: William  Woodruff III M.D.   On: 12/31/2020 17:36   MR ANGIO HEAD WO CONTRAST  Result Date: 01/01/2021 CLINICAL DATA:  Visual changes beginning 1 week ago. Occipital infarct on CT. EXAM: MRI HEAD WITHOUT CONTRAST MRA HEAD WITHOUT CONTRAST MRA NECK WITHOUT CONTRAST TECHNIQUE: Multiplanar, multiecho pulse sequences of the brain and surrounding structures were obtained without intravenous contrast. Angiographic images of the Circle of Willis were obtained using MRA technique without intravenous contrast. Angiographic images of the neck were obtained using MRA technique without intravenous contrast. Carotid stenosis measurements (when applicable) are obtained utilizing NASCET criteria, using the distal internal carotid diameter as the denominator. COMPARISON:  Head CT 12/31/2020 FINDINGS: MRI HEAD FINDINGS Brain: There is a 1.5 cm acute or early subacute infarct involving cortex and subcortical white matter in the right occipital lobe. The brain is normal in signal elsewhere. No intracranial hemorrhage, mass, midline shift, or extra-axial fluid collection is identified. The ventricles and sulci are normal. Vascular: Major intracranial vascular flow voids   are preserved. Skull and upper cervical spine: Unremarkable bone marrow signal. 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 Sinuses/Orbits: Unremarkable orbits. Mild bilateral ethmoid air cell mucosal thickening. Clear mastoid air cells. Other: None. MRA HEAD FINDINGS The study is mildly motion degraded. The intracranial vertebral arteries are widely patent to the basilar with the left being mildly dominant. Patent PICA, AICA, and SCA origins are seen bilaterally. The basilar artery is widely patent. Posterior communicating arteries are not clearly identified and may be diminutive or absent. Both PCAs are patent without evidence of a significant proximal stenosis. The internal carotid  arteries are patent from skull base to carotid termini. Focal partial signal loss involving the right greater than left ICAs at the level of the anterior genu may be artifactual due to motion and vessel orientation or indicative of underlying stenoses with a severe stenosis possible on the right. Of note, no significant atherosclerotic calcification is evident on the comparison CT which would favor the appearance on MRA being artifactual. ACAs and MCAs are patent without evidence of a proximal branch occlusion or flow limiting proximal stenosis. The right A1 segment is hypoplastic and poorly visualized. No aneurysm is identified. MRA NECK FINDINGS Assessment is limited by intermittent mild to moderate motion artifact and noncontrast technique. There is a standard 3 vessel aortic arch. The common carotid and cervical internal carotid arteries are patent without evidence of a dissection or definite significant stenosis allowing for intermittent motion artifact. The vertebral arteries are patent with antegrade flow bilaterally. The proximal V1 segments are poorly seen due to motion artifact. No significant stenosis or dissection is evident involving either vertebral artery more distally in the neck. IMPRESSION: 1. Small acute or early subacute right occipital infarct. 2. Artifact versus stenosis at the anterior genu of the right greater than left internal carotid arteries with artifact favored. Otherwise negative head MRA. 3. Motion degraded neck MRA. Patent cervical carotid and vertebral arteries. Electronically Signed   By: Allen  Grady M.D.   On: 01/01/2021 08:51   MR ANGIO NECK WO CONTRAST  Result Date: 01/01/2021 CLINICAL DATA:  Visual changes beginning 1 week ago. Occipital infarct on CT. EXAM: MRI HEAD WITHOUT CONTRAST MRA HEAD WITHOUT CONTRAST MRA NECK WITHOUT CONTRAST TECHNIQUE: Multiplanar, multiecho pulse sequences of the brain and surrounding structures were obtained without intravenous contrast.  Angiographic images of the Circle of Willis were obtained using MRA technique without intravenous contrast. Angiographic images of the neck were obtained using MRA technique without intravenous contrast. Carotid stenosis measurements (when applicable) are obtained utilizing NASCET criteria, using the distal internal carotid diameter as the denominator. COMPARISON:  Head CT 12/31/2020 FINDINGS: MRI HEAD FINDINGS Brain: There is a 1.5 cm acute or early subacute infarct involving cortex and subcortical white matter in the right occipital lobe. The brain is normal in signal elsewhere. No intracranial hemorrhage, mass, midline shift, or extra-axial fluid collection is identified. The ventricles and sulci are normal. Vascular: Major intracranial vascular flow voids are preserved. Skull and upper cervical spine: Unremarkable bone marrow signal. 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 Sinuses/Orbits: Unremarkable orbits. Mild bilateral ethmoid air cell mucosal thickening. Clear mastoid air cells. Other: None. MRA HEAD FINDINGS The study is mildly motion degraded. The intracranial vertebral arteries are widely patent to the basilar with the left being mildly dominant. Patent PICA, AICA, and SCA origins are seen bilaterally. The basilar artery is widely patent. Posterior communicating arteries are not clearly identified and may be diminutive or absent. Both PCAs are patent without evidence of   a significant proximal stenosis. The internal carotid arteries are patent from skull base to carotid termini. Focal partial signal loss involving the right greater than left ICAs at the level of the anterior genu may be artifactual due to motion and vessel orientation or indicative of underlying stenoses with a severe stenosis possible on the right. Of note, no significant atherosclerotic calcification is evident on the comparison CT which would favor the appearance on MRA being artifactual. ACAs and MCAs are patent without evidence of a  proximal branch occlusion or flow limiting proximal stenosis. The right A1 segment is hypoplastic and poorly visualized. No aneurysm is identified. MRA NECK FINDINGS Assessment is limited by intermittent mild to moderate motion artifact and noncontrast technique. There is a standard 3 vessel aortic arch. The common carotid and cervical internal carotid arteries are patent without evidence of a dissection or definite significant stenosis allowing for intermittent motion artifact. The vertebral arteries are patent with antegrade flow bilaterally. The proximal V1 segments are poorly seen due to motion artifact. No significant stenosis or dissection is evident involving either vertebral artery more distally in the neck. IMPRESSION: 1. Small acute or early subacute right occipital infarct. 2. Artifact versus stenosis at the anterior genu of the right greater than left internal carotid arteries with artifact favored. Otherwise negative head MRA. 3. Motion degraded neck MRA. Patent cervical carotid and vertebral arteries. Electronically Signed   By: Allen  Grady M.D.   On: 01/01/2021 08:51   MR BRAIN WO CONTRAST  Result Date: 01/01/2021 CLINICAL DATA:  Visual changes beginning 1 week ago. Occipital infarct on CT. EXAM: MRI HEAD WITHOUT CONTRAST MRA HEAD WITHOUT CONTRAST MRA NECK WITHOUT CONTRAST TECHNIQUE: Multiplanar, multiecho pulse sequences of the brain and surrounding structures were obtained without intravenous contrast. Angiographic images of the Circle of Willis were obtained using MRA technique without intravenous contrast. Angiographic images of the neck were obtained using MRA technique without intravenous contrast. Carotid stenosis measurements (when applicable) are obtained utilizing NASCET criteria, using the distal internal carotid diameter as the denominator. COMPARISON:  Head CT 12/31/2020 FINDINGS: MRI HEAD FINDINGS Brain: There is a 1.5 cm acute or early subacute infarct involving cortex and  subcortical white matter in the right occipital lobe. The brain is normal in signal elsewhere. No intracranial hemorrhage, mass, midline shift, or extra-axial fluid collection is identified. The ventricles and sulci are normal. Vascular: Major intracranial vascular flow voids are preserved. Skull and upper cervical spine: Unremarkable bone marrow signal. 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 Sinuses/Orbits: Unremarkable orbits. Mild bilateral ethmoid air cell mucosal thickening. Clear mastoid air cells. Other: None. MRA HEAD FINDINGS The study is mildly motion degraded. The intracranial vertebral arteries are widely patent to the basilar with the left being mildly dominant. Patent PICA, AICA, and SCA origins are seen bilaterally. The basilar artery is widely patent. Posterior communicating arteries are not clearly identified and may be diminutive or absent. Both PCAs are patent without evidence of a significant proximal stenosis. The internal carotid arteries are patent from skull base to carotid termini. Focal partial signal loss involving the right greater than left ICAs at the level of the anterior genu may be artifactual due to motion and vessel orientation or indicative of underlying stenoses with a severe stenosis possible on the right. Of note, no significant atherosclerotic calcification is evident on the comparison CT which would favor the appearance on MRA being artifactual. ACAs and MCAs are patent without evidence of a proximal branch occlusion or flow limiting proximal stenosis.   The right A1 segment is hypoplastic and poorly visualized. No aneurysm is identified. MRA NECK FINDINGS Assessment is limited by intermittent mild to moderate motion artifact and noncontrast technique. There is a standard 3 vessel aortic arch. The common carotid and cervical internal carotid arteries are patent without evidence of a dissection or definite significant stenosis allowing for intermittent motion artifact. The  vertebral arteries are patent with antegrade flow bilaterally. The proximal V1 segments are poorly seen due to motion artifact. No significant stenosis or dissection is evident involving either vertebral artery more distally in the neck. IMPRESSION: 1. Small acute or early subacute right occipital infarct. 2. Artifact versus stenosis at the anterior genu of the right greater than left internal carotid arteries with artifact favored. Otherwise negative head MRA. 3. Motion degraded neck MRA. Patent cervical carotid and vertebral arteries. Electronically Signed   By: Allen  Grady M.D.   On: 01/01/2021 08:51   DG CHEST PORT 1 VIEW  Result Date: 12/31/2020 CLINICAL DATA:  Acute migraine blurred vision, elevated blood pressure EXAM: PORTABLE CHEST 1 VIEW COMPARISON:  Report from chest radiograph December 17, 2006 however no imaging available for review at time of dictation. FINDINGS: The heart size and mediastinal contours are within normal limits. Pulmonary hyperinflation with chronic appearing bronchitic lung changes. No focal consolidation. No visible pleural effusion or pneumothorax. The visualized skeletal structures are unremarkable. IMPRESSION: 1. No acute cardiopulmonary findings. 2. Pulmonary hyperinflation with chronic appearing bronchitic lung changes suggestive of chronic obstructive pulmonary disease. Electronically Signed   By: Jeffrey  Waltz MD   On: 12/31/2020 21:42   VAS US LOWER EXTREMITY VENOUS (DVT)  Result Date: 01/01/2021  Lower Venous DVT Study Patient Name:  Leronda Krausz  Date of Exam:   01/01/2021 Medical Rec #: [id number]       Accession #:    [id number] Date of Birth: 02/11/1951       Patient Gender: F Patient Age:   69Y Exam Location:  [facility] Procedure:      VAS US LOWER EXTREMITY VENOUS (DVT) Referring Phys: [id number] [name] XU --------------------------------------------------------------------------------  Indications: Stroke.  Comparison Study: No prior study Performing  Technologist: Michelle Simonetti MHA, RDMS, RVT, RDCS  Examination Guidelines: A complete evaluation includes B-mode imaging, spectral Doppler, color Doppler, and power Doppler as needed of all accessible portions of each vessel. Bilateral testing is considered an integral part of a complete examination. Limited examinations for reoccurring indications may be performed as noted. The reflux portion of the exam is performed with the patient in reverse Trendelenburg.  +---------+---------------+---------+-----------+----------+--------------+ RIGHT    CompressibilityPhasicitySpontaneityPropertiesThrombus Aging +---------+---------------+---------+-----------+----------+--------------+ CFV      Full           Yes      Yes                                 +---------+---------------+---------+-----------+----------+--------------+ SFJ      Full                                                        +---------+---------------+---------+-----------+----------+--------------+ FV Prox  Full                                                        +---------+---------------+---------+-----------+----------+--------------+   FV Mid   Full                                                        +---------+---------------+---------+-----------+----------+--------------+ FV DistalFull                                                        +---------+---------------+---------+-----------+----------+--------------+ PFV      Full                                                        +---------+---------------+---------+-----------+----------+--------------+ POP      Full           Yes      Yes                                 +---------+---------------+---------+-----------+----------+--------------+ PTV      Full                                                        +---------+---------------+---------+-----------+----------+--------------+ PERO     Full                                                         +---------+---------------+---------+-----------+----------+--------------+   +---------+---------------+---------+-----------+----------+--------------+ LEFT     CompressibilityPhasicitySpontaneityPropertiesThrombus Aging +---------+---------------+---------+-----------+----------+--------------+ CFV      Full           Yes      Yes                                 +---------+---------------+---------+-----------+----------+--------------+ SFJ      Full                                                        +---------+---------------+---------+-----------+----------+--------------+ FV Prox  Full                                                        +---------+---------------+---------+-----------+----------+--------------+ FV Mid   Full                                                        +---------+---------------+---------+-----------+----------+--------------+   FV DistalFull                                                        +---------+---------------+---------+-----------+----------+--------------+ PFV      Full                                                        +---------+---------------+---------+-----------+----------+--------------+ POP      Full           Yes      Yes                                 +---------+---------------+---------+-----------+----------+--------------+ PTV      Full                                                        +---------+---------------+---------+-----------+----------+--------------+ PERO     Full                                                        +---------+---------------+---------+-----------+----------+--------------+     Summary: RIGHT: - There is no evidence of deep vein thrombosis in the lower extremity.  - No cystic structure found in the popliteal fossa.  LEFT: - There is no evidence of deep vein thrombosis in the lower extremity.  - No cystic  structure found in the popliteal fossa.  *See table(s) above for measurements and observations.    Preliminary     Scheduled Meds: . [START ON 01/02/2021] aspirin EC  81 mg Oral Daily  . atorvastatin  80 mg Oral Daily  . [START ON 01/02/2021] clopidogrel  75 mg Oral Daily  . pantoprazole  40 mg Oral Daily  . venlafaxine XR  75 mg Oral Q breakfast   Continuous Infusions:   LOS: 0 days   Time spent: 25 minutes.  Gazelle Towe B Jessilyn Catino, MD [facility] [email or web address] 01/01/2021, 2:25 PM  

## 2021-01-01 NOTE — Plan of Care (Signed)

## 2021-01-01 NOTE — Progress Notes (Signed)
Bilateral lower extremity venous duplex completed. Refer to "CV Proc" under chart review to view preliminary results.  01/01/2021 11:24 AM Eula Fried., MHA, RVT, RDCS, RDMS

## 2021-01-01 NOTE — Consult Note (Addendum)
ELECTROPHYSIOLOGY CONSULT NOTE  Patient ID: Yolanda Gibbs MRN: [id number], DOB/AGE: May 10, 1951   Admit date: 12/31/2020 Date of Consult: 01/02/2021  Primary Physician: Sandford Craze, NP Primary Cardiologist: None  Primary Electrophysiologist: New to Dr. Royann Shivers Reason for Consultation: Cryptogenic stroke; recommendations regarding Implantable Loop Recorder Insurance: Medicare A/B  History of Present Illness EP has been asked to evaluate Yolanda Gibbs for placement of an implantable loop recorder to monitor for atrial fibrillation by Dr Roda Shutters.  The patient was admitted on 12/31/2020 with visual changes.  These were initially described as "zig zag lines" and now just "blurriness" and like she's "missing part of the picture".  Imaging demonstrated subacute occipital stroke.    She has undergone workup for stroke including:  - Code Stroke CT: Focal area of decreased attenuation in the posterior aspect of the mid right occipital lobe, felt to represent a recent and potential acute focal infarct in this area - MRI: Small acute or early subacute right occipital infarct. - MRA: . Small acute or early subacute right occipital infarct. - 2D Echo 01/01/2021 LVEF 60-65%, no sign of thrombus - DVT US 01/01/21 negative - VTE prophylaxis - per primary team, ambulatory    The patient has been monitored on telemetry which has demonstrated sinus rhythm with no arrhythmias.  Inpatient stroke work-up will require a TEE per Neurology.   Echocardiogram as above. Lab work is reviewed.  Prior to admission, the patient denies chest pain, shortness of breath, dizziness, palpitations, or syncope.  She is recovering from her stroke with plans to return home  at discharge.  Past Medical History:  Diagnosis Date   Allergy    seasonal   Depression    GERD (gastroesophageal reflux disease)    History of chicken pox    History of colon polyps    benign   Hypertension      Surgical History: History  reviewed. No pertinent surgical history.   Medications Prior to Admission  Medication Sig Dispense Refill Last Dose   azelastine (ASTELIN) 0.1 % nasal spray Place 2 sprays into both nostrils 2 (two) times daily. Use in each nostril as directed (Patient taking differently: Place 2 sprays into both nostrils 2 (two) times daily as needed for allergies. Use in each nostril as directed) 30 mL 12 unknown   Calcium-Phosphorus-Vitamin D (CITRACAL +D3 PO) Take 1 tablet by mouth daily. Vit d 500 IU, calcium  each   12/31/2020 at Unknown time   Docusate Sodium 100 MG capsule Take 200 mg by mouth at bedtime.   Past Week at Unknown time   esomeprazole (NEXIUM) 40 MG capsule TAKE 1 CAPSULE(40 MG) BY MOUTH DAILY 30 capsule 5 Past Week at Unknown time   losartan (COZAAR) 50 MG tablet Take 50 mg by mouth daily.   12/31/2020 at Unknown time   metoCLOPramide (REGLAN) 5 MG tablet Take 5 mg by mouth at bedtime.   Past Week at Unknown time   Multiple Vitamins-Minerals (MULTIVITAMIN ADULTS 50+) TABS Take 1 tablet by mouth daily.   Past Week at Unknown time   simvastatin (ZOCOR) 20 MG tablet Take 20 mg by mouth at bedtime.   Past Week at Unknown time   venlafaxine XR (EFFEXOR-XR) 75 MG 24 hr capsule TAKE 1 CAPSULE BY MOUTH DAILY WITH BREAKFAST 90 capsule 0 12/31/2020 at Unknown time   Vitamin D, Ergocalciferol, (DRISDOL) 1.25 MG (50000 UNIT) CAPS capsule Take 50,000 Units by mouth once a week. Take on Tuesdays   Past Week at Unknown  time    Inpatient Medications:   aspirin EC  81 mg Oral Daily   atorvastatin  40 mg Oral Daily   clopidogrel  75 mg Oral Daily   enoxaparin (LOVENOX) injection  40 mg Subcutaneous Q24H   pantoprazole  40 mg Oral Daily   venlafaxine XR  75 mg Oral Q breakfast    Allergies:  Allergies  Allergen Reactions   Darvon [Propoxyphene] Other (See Comments)    "Slept for 3 days"   Duloxetine     Added from [facility]- patient is unsure     Social History   Socioeconomic History    Marital status: Married    Spouse name: Not on file   Number of children: Not on file   Years of education: Not on file   Highest education level: Not on file  Occupational History   Not on file  Tobacco Use   Smoking status: Never Smoker   Smokeless tobacco: Never Used  Vaping Use   Vaping Use: Never used  Substance and Sexual Activity   Alcohol use: Yes    Alcohol/week: 3.0 - 4.0 standard drinks    Types: 3 - 4 Standard drinks or equivalent per week    Comment: occasional   Drug use: No   Sexual activity: Not on file  Other Topics Concern   Not on file  Social History Narrative   Daughter at Haroldine Laws (Julian Hy) born 80   Daughter Erin 77   Husband is a Curator, has a Forensic scientist, has a Landscape architect helps with day to day of the business   Second marriage- prior to remarrying worked in Passenger transport manager.   Completed college   Enjoys watching old movies, needlework, cleaning her house   No pets.    Social Determinants of Health   Financial Resource Strain: Not on file  Food Insecurity: Not on file  Transportation Needs: Not on file  Physical Activity: Not on file  Stress: Not on file  Social Connections: Not on file  Intimate Partner Violence: Not on file     Family History  Problem Relation Age of Onset   Arthritis Mother    CAD Mother    AAA (abdominal aortic aneurysm) Mother    Cancer Mother        lung   Stroke Father    Cancer Maternal Aunt        breast   Arthritis Maternal Grandmother    Cancer Maternal Grandfather        prostate   Depression Paternal Grandmother       Review of Systems: All other systems reviewed and are otherwise negative except as noted above.  Physical Exam: Vitals:   01/01/21 2119 01/02/21 0037 01/02/21 0417 01/02/21 0710  BP: (!) 147/78 (!) 155/70 135/82   Pulse: 76 80 77   Resp: Temp: (!) 97.4 F (36.3 C) 98 F (36.7 C) 98.1 F (36.7 C)   TempSrc: Oral Oral Oral   SpO2: 95% 97% 97% 98%   Weight:      Height:        GEN- The patient is well appearing, alert and oriented x 3 today.   Head- normocephalic, atraumatic Eyes-  Sclera clear, conjunctiva pink Ears- hearing intact Oropharynx- clear Neck- supple Lungs- Clear to ausculation bilaterally, normal work of breathing Heart- Regular rate and rhythm, no murmurs, rubs or gallops  GI- soft, NT, ND, + BS Extremities- no clubbing, cyanosis, or edema  MS- no significant deformity or atrophy Skin- no rash or lesion Psych- euthymic mood, full affect   Labs:   Lab Results  Component Value Date   WBC 6.2 12/31/2020   HGB 14.0 12/31/2020   HCT 43.9 12/31/2020   MCV 92.6 12/31/2020   PLT 322 12/31/2020    Recent Labs  Lab 12/31/20 1638  NA 138  K 3.8  CL 103  CO2 25  BUN 16  CREATININE 0.56  CALCIUM 9.4  GLUCOSE 93     Radiology/Studies: CT Head Wo Contrast  Result Date: 12/31/2020 CLINICAL DATA:  Headache with blurred vision.  Hypertension EXAM: CT HEAD WITHOUT CONTRAST TECHNIQUE: Contiguous axial images were obtained from the base of the skull through the vertex without intravenous contrast. COMPARISON:  None. FINDINGS: Brain: The ventricles and sulci are normal in size and configuration. There is no intracranial mass, hemorrhage, extra-axial fluid collection, or midline shift. There is a focal area of decreased attenuation in the posterior mid right occipital lobe consistent with a small suspected recent infarct in the right occipital lobe posteriorly. Elsewhere brain parenchyma appears unremarkable. Vascular: No hyperdense vessel. No appreciable vascular calcification. Skull: Bony calvarium appears intact. There is a midline frontal exostosis measuring 1.3 x 0.6 cm, a benign finding. Sinuses/Orbits: Visualized paranasal sinuses are clear. Orbits appear symmetric bilaterally. Other: Mastoid air cells clear. IMPRESSION: Focal area of decreased attenuation in the posterior aspect of the mid right occipital lobe, felt  to represent a recent and potentially acute focal infarct in this area. On CT, this area of apparent infarct measures approximately 1.5 x 1.5 cm. Elsewhere brain parenchyma appears unremarkable. No mass or hemorrhage. Electronically Signed   By: Bretta Bang III M.D.   On: 12/31/2020 17:36   MR ANGIO HEAD WO CONTRAST  Result Date: 01/01/2021 CLINICAL DATA:  Visual changes beginning 1 week ago. Occipital infarct on CT. EXAM: MRI HEAD WITHOUT CONTRAST MRA HEAD WITHOUT CONTRAST MRA NECK WITHOUT CONTRAST TECHNIQUE: Multiplanar, multiecho pulse sequences of the brain and surrounding structures were obtained without intravenous contrast. Angiographic images of the Circle of Willis were obtained using MRA technique without intravenous contrast. Angiographic images of the neck were obtained using MRA technique without intravenous contrast. Carotid stenosis measurements (when applicable) are obtained utilizing NASCET criteria, using the distal internal carotid diameter as the denominator. COMPARISON:  Head CT 12/31/2020 FINDINGS: MRI HEAD FINDINGS Brain: There is a 1.5 cm acute or early subacute infarct involving cortex and subcortical white matter in the right occipital lobe. The brain is normal in signal elsewhere. No intracranial hemorrhage, mass, midline shift, or extra-axial fluid collection is identified. The ventricles and sulci are normal. Vascular: Major intracranial vascular flow voids are preserved. Skull and upper cervical spine: Unremarkable bone marrow signal. 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 Sinuses/Orbits: Unremarkable orbits. Mild bilateral ethmoid air cell mucosal thickening. Clear mastoid air cells. Other: None. MRA HEAD FINDINGS The study is mildly motion degraded. The intracranial vertebral arteries are widely patent to the basilar with the left being mildly dominant. Patent PICA, AICA, and SCA origins are seen bilaterally. The basilar artery is widely patent. Posterior communicating  arteries are not clearly identified and may be diminutive or absent. Both PCAs are patent without evidence of a significant proximal stenosis. The internal carotid arteries are patent from skull base to carotid termini. Focal partial signal loss involving the right greater than left ICAs at the level of the anterior genu may be artifactual due to motion and vessel orientation or indicative  of underlying stenoses with a severe stenosis possible on the right. Of note, no significant atherosclerotic calcification is evident on the comparison CT which would favor the appearance on MRA being artifactual. ACAs and MCAs are patent without evidence of a proximal branch occlusion or flow limiting proximal stenosis. The right A1 segment is hypoplastic and poorly visualized. No aneurysm is identified. MRA NECK FINDINGS Assessment is limited by intermittent mild to moderate motion artifact and noncontrast technique. There is a standard 3 vessel aortic arch. The common carotid and cervical internal carotid arteries are patent without evidence of a dissection or definite significant stenosis allowing for intermittent motion artifact. The vertebral arteries are patent with antegrade flow bilaterally. The proximal V1 segments are poorly seen due to motion artifact. No significant stenosis or dissection is evident involving either vertebral artery more distally in the neck. IMPRESSION: 1. Small acute or early subacute right occipital infarct. 2. Artifact versus stenosis at the anterior genu of the right greater than left internal carotid arteries with artifact favored. Otherwise negative head MRA. 3. Motion degraded neck MRA. Patent cervical carotid and vertebral arteries. Electronically Signed   By: Sebastian Ache M.D.   On: 01/01/2021 08:51   MR ANGIO NECK WO CONTRAST  Result Date: 01/01/2021 CLINICAL DATA:  Visual changes beginning 1 week ago. Occipital infarct on CT. EXAM: MRI HEAD WITHOUT CONTRAST MRA HEAD WITHOUT CONTRAST  MRA NECK WITHOUT CONTRAST TECHNIQUE: Multiplanar, multiecho pulse sequences of the brain and surrounding structures were obtained without intravenous contrast. Angiographic images of the Circle of Willis were obtained using MRA technique without intravenous contrast. Angiographic images of the neck were obtained using MRA technique without intravenous contrast. Carotid stenosis measurements (when applicable) are obtained utilizing NASCET criteria, using the distal internal carotid diameter as the denominator. COMPARISON:  Head CT 12/31/2020 FINDINGS: MRI HEAD FINDINGS Brain: There is a 1.5 cm acute or early subacute infarct involving cortex and subcortical white matter in the right occipital lobe. The brain is normal in signal elsewhere. No intracranial hemorrhage, mass, midline shift, or extra-axial fluid collection is identified. The ventricles and sulci are normal. Vascular: Major intracranial vascular flow voids are preserved. Skull and upper cervical spine: Unremarkable bone marrow signal. 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 Sinuses/Orbits: Unremarkable orbits. Mild bilateral ethmoid air cell mucosal thickening. Clear mastoid air cells. Other: None. MRA HEAD FINDINGS The study is mildly motion degraded. The intracranial vertebral arteries are widely patent to the basilar with the left being mildly dominant. Patent PICA, AICA, and SCA origins are seen bilaterally. The basilar artery is widely patent. Posterior communicating arteries are not clearly identified and may be diminutive or absent. Both PCAs are patent without evidence of a significant proximal stenosis. The internal carotid arteries are patent from skull base to carotid termini. Focal partial signal loss involving the right greater than left ICAs at the level of the anterior genu may be artifactual due to motion and vessel orientation or indicative of underlying stenoses with a severe stenosis possible on the right. Of note, no significant  atherosclerotic calcification is evident on the comparison CT which would favor the appearance on MRA being artifactual. ACAs and MCAs are patent without evidence of a proximal branch occlusion or flow limiting proximal stenosis. The right A1 segment is hypoplastic and poorly visualized. No aneurysm is identified. MRA NECK FINDINGS Assessment is limited by intermittent mild to moderate motion artifact and noncontrast technique. There is a standard 3 vessel aortic arch. The common carotid and cervical internal carotid  arteries are patent without evidence of a dissection or definite significant stenosis allowing for intermittent motion artifact. The vertebral arteries are patent with antegrade flow bilaterally. The proximal V1 segments are poorly seen due to motion artifact. No significant stenosis or dissection is evident involving either vertebral artery more distally in the neck. IMPRESSION: 1. Small acute or early subacute right occipital infarct. 2. Artifact versus stenosis at the anterior genu of the right greater than left internal carotid arteries with artifact favored. Otherwise negative head MRA. 3. Motion degraded neck MRA. Patent cervical carotid and vertebral arteries. Electronically Signed   By: Sebastian Ache M.D.   On: 01/01/2021 08:51   MR BRAIN WO CONTRAST  Result Date: 01/01/2021 CLINICAL DATA:  Visual changes beginning 1 week ago. Occipital infarct on CT. EXAM: MRI HEAD WITHOUT CONTRAST MRA HEAD WITHOUT CONTRAST MRA NECK WITHOUT CONTRAST TECHNIQUE: Multiplanar, multiecho pulse sequences of the brain and surrounding structures were obtained without intravenous contrast. Angiographic images of the Circle of Willis were obtained using MRA technique without intravenous contrast. Angiographic images of the neck were obtained using MRA technique without intravenous contrast. Carotid stenosis measurements (when applicable) are obtained utilizing NASCET criteria, using the distal internal carotid  diameter as the denominator. COMPARISON:  Head CT 12/31/2020 FINDINGS: MRI HEAD FINDINGS Brain: There is a 1.5 cm acute or early subacute infarct involving cortex and subcortical white matter in the right occipital lobe. The brain is normal in signal elsewhere. No intracranial hemorrhage, mass, midline shift, or extra-axial fluid collection is identified. The ventricles and sulci are normal. Vascular: Major intracranial vascular flow voids are preserved. Skull and upper cervical spine: Unremarkable bone marrow signal. 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 Sinuses/Orbits: Unremarkable orbits. Mild bilateral ethmoid air cell mucosal thickening. Clear mastoid air cells. Other: None. MRA HEAD FINDINGS The study is mildly motion degraded. The intracranial vertebral arteries are widely patent to the basilar with the left being mildly dominant. Patent PICA, AICA, and SCA origins are seen bilaterally. The basilar artery is widely patent. Posterior communicating arteries are not clearly identified and may be diminutive or absent. Both PCAs are patent without evidence of a significant proximal stenosis. The internal carotid arteries are patent from skull base to carotid termini. Focal partial signal loss involving the right greater than left ICAs at the level of the anterior genu may be artifactual due to motion and vessel orientation or indicative of underlying stenoses with a severe stenosis possible on the right. Of note, no significant atherosclerotic calcification is evident on the comparison CT which would favor the appearance on MRA being artifactual. ACAs and MCAs are patent without evidence of a proximal branch occlusion or flow limiting proximal stenosis. The right A1 segment is hypoplastic and poorly visualized. No aneurysm is identified. MRA NECK FINDINGS Assessment is limited by intermittent mild to moderate motion artifact and noncontrast technique. There is a standard 3 vessel aortic arch. The common carotid and  cervical internal carotid arteries are patent without evidence of a dissection or definite significant stenosis allowing for intermittent motion artifact. The vertebral arteries are patent with antegrade flow bilaterally. The proximal V1 segments are poorly seen due to motion artifact. No significant stenosis or dissection is evident involving either vertebral artery more distally in the neck. IMPRESSION: 1. Small acute or early subacute right occipital infarct. 2. Artifact versus stenosis at the anterior genu of the right greater than left internal carotid arteries with artifact favored. Otherwise negative head MRA. 3. Motion degraded neck MRA. Patent cervical  carotid and vertebral arteries. Electronically Signed   By: Sebastian Ache M.D.   On: 01/01/2021 08:51   DG CHEST PORT 1 VIEW  Result Date: 12/31/2020 CLINICAL DATA:  Acute migraine blurred vision, elevated blood pressure EXAM: PORTABLE CHEST 1 VIEW COMPARISON:  Report from chest radiograph December 17, 2006 however no imaging available for review at time of dictation. FINDINGS: The heart size and mediastinal contours are within normal limits. Pulmonary hyperinflation with chronic appearing bronchitic lung changes. No focal consolidation. No visible pleural effusion or pneumothorax. The visualized skeletal structures are unremarkable. IMPRESSION: 1. No acute cardiopulmonary findings. 2. Pulmonary hyperinflation with chronic appearing bronchitic lung changes suggestive of chronic obstructive pulmonary disease. Electronically Signed   By: Maudry Mayhew MD   On: 12/31/2020 21:42   ECHOCARDIOGRAM COMPLETE  Result Date: 01/01/2021    ECHOCARDIOGRAM REPORT   Patient Name:   Yolanda Gibbs Date of Exam: 01/01/2021 Medical Rec #:  [id number]      Height:       64.0 in Accession #:    [id number]     Weight:       152.0 lb Date of Birth:  12/21/1950      BSA:          1.741 m Patient Age:    69 years       BP:           147/84 mmHg Patient Gender: F              HR:            73 bpm. Exam Location:  Inpatient Procedure: 2D Echo, Cardiac Doppler and Color Doppler Indications:    Stroke  History:        Patient has no prior history of Echocardiogram examinations.                 Risk Factors:Hypertension.  Sonographer:    Shirlean Kelly Referring Phys: Consepcion Hearing ANASTASSIA DOUTOVA IMPRESSIONS  1. Left ventricular ejection fraction, by estimation, is 60 to 65%. The left ventricle has normal function. The left ventricle has no regional wall motion abnormalities. There is mild left ventricular hypertrophy. Left ventricular diastolic parameters are indeterminate.  2. Right ventricular systolic function is normal. The right ventricular size is normal.  3. The mitral valve is normal in structure. Trivial mitral valve regurgitation.  4. The aortic valve is tricuspid. Aortic valve regurgitation is not visualized. Mild aortic valve sclerosis is present, with no evidence of aortic valve stenosis.  5. The inferior vena cava is normal in size with greater than 50% respiratory variability, suggesting right atrial pressure of 3 mmHg. FINDINGS  Left Ventricle: Left ventricular ejection fraction, by estimation, is 60 to 65%. The left ventricle has normal function. The left ventricle has no regional wall motion abnormalities. The left ventricular internal cavity size was normal in size. There is  mild left ventricular hypertrophy. Left ventricular diastolic parameters are indeterminate. Right Ventricle: The right ventricular size is normal. Right vetricular wall thickness was not assessed. Right ventricular systolic function is normal. Left Atrium: Left atrial size was normal in size. Right Atrium: Right atrial size was normal in size. Pericardium: Trivial pericardial effusion is present. Mitral Valve: The mitral valve is normal in structure. Trivial mitral valve regurgitation. Tricuspid Valve: The tricuspid valve is normal in structure. Tricuspid valve regurgitation is mild. Aortic Valve: The  aortic valve is tricuspid. Aortic valve regurgitation is not visualized. Mild aortic valve sclerosis is present, with no  evidence of aortic valve stenosis. Aortic valve mean gradient measures 4.0 mmHg. Aortic valve peak gradient measures 6.2 mmHg. Aortic valve area, by VTI measures 3.02 cm. Pulmonic Valve: The pulmonic valve was not well visualized. Pulmonic valve regurgitation is not visualized. Aorta: The aortic root is normal in size and structure. Venous: The inferior vena cava is normal in size with greater than 50% respiratory variability, suggesting right atrial pressure of 3 mmHg. IAS/Shunts: No atrial level shunt detected by color flow Doppler.  LEFT VENTRICLE PLAX 2D LVIDd:         3.10 cm  Diastology LVIDs:         2.20 cm  LV e' medial:    5.66 cm/s LV PW:         1.10 cm  LV E/e' medial:  11.9 LV IVS:        1.40 cm  LV e' lateral:   6.09 cm/s LVOT diam:     2.10 cm  LV E/e' lateral: 11.1 LV SV:         79 LV SV Index:   46 LVOT Area:     3.46 cm  RIGHT VENTRICLE             IVC RV S prime:     12.70 cm/s  IVC diam: 1.10 cm TAPSE (M-mode): 2.4 cm LEFT ATRIUM             Index       RIGHT ATRIUM           Index LA diam:        2.80 cm 1.61 cm/m  RA Area:     10.70 cm LA Vol (A2C):   41.6 ml 23.89 ml/m RA Volume:   23.40 ml  13.44 ml/m LA Vol (A4C):   34.8 ml 19.99 ml/m LA Biplane Vol: 38.4 ml 22.06 ml/m  AORTIC VALVE AV Area (Vmax):    3.27 cm AV Area (Vmean):   3.01 cm AV Area (VTI):     3.02 cm AV Vmax:           124.00 cm/s AV Vmean:          89.300 cm/s AV VTI:            0.263 m AV Peak Grad:      6.2 mmHg AV Mean Grad:      4.0 mmHg LVOT Vmax:         117.00 cm/s LVOT Vmean:        77.700 cm/s LVOT VTI:          0.229 m LVOT/AV VTI ratio: 0.87  AORTA Ao Root diam: 3.20 cm Ao Asc diam:  3.40 cm MITRAL VALVE MV Area (PHT): 4.15 cm    SHUNTS MV Decel Time: 183 msec    Systemic VTI:  0.23 m MV E velocity: 67.30 cm/s  Systemic Diam: 2.10 cm MV A velocity: 74.10 cm/s MV E/A ratio:  0.91 Dietrich PatesPaula  Ross MD Electronically signed by Dietrich PatesPaula Ross MD Signature Date/Time: 01/01/2021/2:39:26 PM    Final    VAS US LOWER EXTREMITY VENOUS (DVT)  Result Date: 01/01/2021  Lower Venous DVT Study Patient Name:  Yolanda Gibbs  Date of Exam:   01/01/2021 Medical Rec #: [id number]       Accession #:    [id number] Date of Birth: [birth year]       Patient Gender: F Patient Age:   69Y [facility] [place] Procedure:  VAS [place] LOWER EXTREMITY VENOUS (DVT) Referring Phys: [id number] [name] XU --------------------------------------------------------------------------------  Indications: Stroke.  Comparison Study: No prior study Performing Technologist: Gertie Fey MHA, RDMS, RVT, RDCS  Examination Guidelines: A complete evaluation includes B-mode imaging, spectral Doppler, color Doppler, and power Doppler as needed of all accessible portions of each vessel. Bilateral testing is considered an integral part of a complete examination. Limited examinations for reoccurring indications may be performed as noted. The reflux portion of the exam is performed with the patient in reverse Trendelenburg.  +---------+---------------+---------+-----------+----------+--------------+ RIGHT    CompressibilityPhasicitySpontaneityPropertiesThrombus Aging +---------+---------------+---------+-----------+----------+--------------+ CFV      Full           Yes      Yes                                 +---------+---------------+---------+-----------+----------+--------------+ SFJ      Full                                                        +---------+---------------+---------+-----------+----------+--------------+ FV Prox  Full                                                        +---------+---------------+---------+-----------+----------+--------------+ FV Mid   Full                                                         +---------+---------------+---------+-----------+----------+--------------+ FV DistalFull                                                        +---------+---------------+---------+-----------+----------+--------------+ PFV      Full                                                        +---------+---------------+---------+-----------+----------+--------------+ POP      Full           Yes      Yes                                 +---------+---------------+---------+-----------+----------+--------------+ PTV      Full                                                        +---------+---------------+---------+-----------+----------+--------------+ PERO     Full                                                        +---------+---------------+---------+-----------+----------+--------------+   +---------+---------------+---------+-----------+----------+--------------+  LEFT     CompressibilityPhasicitySpontaneityPropertiesThrombus Aging +---------+---------------+---------+-----------+----------+--------------+ CFV      Full           Yes      Yes                                 +---------+---------------+---------+-----------+----------+--------------+ SFJ      Full                                                        +---------+---------------+---------+-----------+----------+--------------+ FV Prox  Full                                                        +---------+---------------+---------+-----------+----------+--------------+ FV Mid   Full                                                        +---------+---------------+---------+-----------+----------+--------------+ FV DistalFull                                                        +---------+---------------+---------+-----------+----------+--------------+ PFV      Full                                                         +---------+---------------+---------+-----------+----------+--------------+ POP      Full           Yes      Yes                                 +---------+---------------+---------+-----------+----------+--------------+ PTV      Full                                                        +---------+---------------+---------+-----------+----------+--------------+ PERO     Full                                                        +---------+---------------+---------+-----------+----------+--------------+     Summary: RIGHT: - There is no evidence of deep vein thrombosis in the lower extremity.  - No cystic structure found in the popliteal fossa.  LEFT: - There is no evidence of deep vein thrombosis in the lower extremity.  - No  cystic structure found in the popliteal fossa.  *See table(s) above for measurements and observations. Electronically signed by Sherald Hess MD on 01/01/2021 at 4:55:01 PM.    Final     12-lead ECG on arrival shows NSR at 67 bpm (personally reviewed) All prior EKG's in EPIC reviewed with no documented atrial fibrillation  Telemetry NSR/ Sinus tach (personally reviewed)  Assessment and Plan:  1. Cryptogenic stroke The patient presents with cryptogenic stroke.  The patient does have a TEE planned for this AM.  I spoke at length with the patient about monitoring for afib with an implantable loop recorder.  Risks, benefits, and alteratives to implantable loop recorder were discussed with the patient today.   At this time, the patient is very clear in their decision to proceed with implantable loop recorder.   Wound care was reviewed with the patient (keep incision clean and dry for 3 days).  Wound check scheduled and entered in AVS. Please call with questions.   Graciella Freer, PA-C 01/02/2021 9:50 AM   I have seen and examined the patient along with Graciella Freer, PA-C.  I have reviewed the chart, notes and new data.  I agree with  PA/NP's note.   PLAN: ILR appropriate for detection of possible asymptomatic paroxysmal atrial fibrillation if TEE does not identify a reason for ischemic stroke This procedure has been fully reviewed with the patient and written informed consent has been obtained.    Thurmon Fair, MD, [facility] [phone] 01/02/2021, 12:52 PM

## 2021-01-02 ENCOUNTER — Inpatient Hospital Stay (HOSPITAL_COMMUNITY): Payer: Medicare Other

## 2021-01-02 ENCOUNTER — Inpatient Hospital Stay (HOSPITAL_COMMUNITY): Payer: Medicare Other | Admitting: Anesthesiology

## 2021-01-02 ENCOUNTER — Encounter (HOSPITAL_COMMUNITY)
Admission: EM | Disposition: A | Discharge status: Home / Self Care | Payer: Self-pay | Source: Home / Self Care | Attending: Family Medicine

## 2021-01-02 ENCOUNTER — Encounter (HOSPITAL_COMMUNITY): Payer: Self-pay | Admitting: Family Medicine

## 2021-01-02 DIAGNOSIS — F32A Depression, unspecified: Secondary | ICD-10-CM

## 2021-01-02 DIAGNOSIS — I10 Essential (primary) hypertension: Secondary | ICD-10-CM

## 2021-01-02 DIAGNOSIS — I63431 Cerebral infarction due to embolism of right posterior cerebral artery: Secondary | ICD-10-CM

## 2021-01-02 DIAGNOSIS — I639 Cerebral infarction, unspecified: Secondary | ICD-10-CM

## 2021-01-02 HISTORY — PX: LOOP RECORDER INSERTION: EP1214

## 2021-01-02 HISTORY — PX: BUBBLE STUDY: SHX6837

## 2021-01-02 HISTORY — PX: TEE WITHOUT CARDIOVERSION: SHX5443

## 2021-01-02 SURGERY — ECHOCARDIOGRAM, TRANSESOPHAGEAL
Anesthesia: Monitor Anesthesia Care

## 2021-01-02 SURGERY — LOOP RECORDER INSERTION

## 2021-01-02 MED ORDER — ATORVASTATIN CALCIUM 40 MG PO TABS: 40.0000 mg | ORAL_TABLET | Freq: Every day | ORAL | 1 refills | Status: DC

## 2021-01-02 MED ORDER — ASPIRIN 81 MG PO TBEC: 81.0000 mg | DELAYED_RELEASE_TABLET | Freq: Every day | ORAL | 1 refills | Status: AC

## 2021-01-02 MED ORDER — SODIUM CHLORIDE 0.9 % IV SOLN: INTRAVENOUS | Status: DC | PRN

## 2021-01-02 MED ORDER — LIDOCAINE-EPINEPHRINE 1 %-1:100000 IJ SOLN
INTRAMUSCULAR | Status: AC
  Filled 2021-01-02: qty 1

## 2021-01-02 MED ORDER — LIDOCAINE-EPINEPHRINE 1 %-1:100000 IJ SOLN
INTRAMUSCULAR | Status: DC | PRN
  Administered 2021-01-02: 30 mL

## 2021-01-02 MED ORDER — CLOPIDOGREL BISULFATE 75 MG PO TABS: 75.0000 mg | ORAL_TABLET | Freq: Every day | ORAL | 0 refills | Status: AC

## 2021-01-02 MED ORDER — SODIUM CHLORIDE 0.9 % IV SOLN
INTRAVENOUS | Status: AC | PRN
  Administered 2021-01-02: 500 mL via INTRAVENOUS

## 2021-01-02 MED ORDER — LIDOCAINE HCL (CARDIAC) PF 100 MG/5ML IV SOSY
PREFILLED_SYRINGE | INTRAVENOUS | Status: DC | PRN
  Administered 2021-01-02: 80 mg via INTRATRACHEAL

## 2021-01-02 MED ORDER — PROPOFOL 10 MG/ML IV BOLUS
INTRAVENOUS | Status: DC | PRN
  Administered 2021-01-02: 20 mg via INTRAVENOUS

## 2021-01-02 MED ORDER — PROPOFOL 500 MG/50ML IV EMUL
INTRAVENOUS | Status: DC | PRN
  Administered 2021-01-02: 125 ug/kg/min via INTRAVENOUS

## 2021-01-02 SURGICAL SUPPLY — 2 items
MONITOR REVEAL LINQ II (Prosthesis & Implant Heart) ×2 IMPLANT
PACK LOOP INSERTION (CUSTOM PROCEDURE TRAY) ×2 IMPLANT

## 2021-01-02 NOTE — Transfer of Care (Signed)
Immediate Anesthesia Transfer of Care Note  Patient: Yolanda Gibbs  Procedure(s) Performed: TRANSESOPHAGEAL ECHOCARDIOGRAM (TEE) (N/A ) BUBBLE STUDY  Patient Location: Endoscopy Unit  Anesthesia Type:MAC  Level of Consciousness: awake, alert , oriented, patient cooperative and responds to stimulation  Airway & Oxygen Therapy: Patient Spontanous Breathing and Patient connected to nasal cannula oxygen  Post-op Assessment: Report given to RN and Post -op Vital signs reviewed and stable  Post vital signs: Reviewed and stable  Last Vitals:  Vitals Value Taken Time  BP    Temp    Pulse 66 01/02/21 1243  Resp 20 01/02/21 1243  SpO2 96 % 01/02/21 1243  Vitals shown include unvalidated device data.  Last Pain:  Vitals:   01/02/21 1138  TempSrc: Oral  PainSc: 0-No pain         Complications: No complications documented.

## 2021-01-02 NOTE — CV Procedure (Signed)
     TRANSESOPHAGEAL ECHOCARDIOGRAM   NAME:  Yolanda Gibbs   MRN: [id number] DOB:  08-21-1950   ADMIT DATE: 12/31/2020  INDICATIONS: CVA  PROCEDURE:   Informed consent was obtained prior to the procedure. The risks, benefits and alternatives for the procedure were discussed and the patient comprehended these risks.  Risks include, but are not limited to, cough, sore throat, vomiting, nausea, somnolence, esophageal and stomach trauma or perforation, bleeding, low blood pressure, aspiration, pneumonia, infection, trauma to the teeth and death.    After a procedural time-out, the oropharynx was anesthetized and the patient was sedated by the anesthesia service. The transesophageal probe was inserted in the esophagus and stomach without difficulty and multiple views were obtained. Anesthesia was monitored by Dr Malen Gauze and Janann Colonel, CRNA   COMPLICATIONS:    There were no immediate complications.  FINDINGS:  Positive bubble study consistent with PFO  [name] MD [facility]  [address] [place], [place] [address] [phone]   12:36 PM

## 2021-01-02 NOTE — Op Note (Signed)
LOOP RECORDER IMPLANT   Procedure report  Procedure performed:  Loop recorder implantation   Reason for procedure:  Cryptogenic stroke Procedure performed by:  Thurmon Fair, MD  Complications:  None  Estimated blood loss:  <5 mL  Medications administered during procedure:  Lidocaine 1% with 1/100,000 epinephrine 10 mL locally Device details:  Medtronic Reveal Linq model number [id number], serial number [id number] Procedure details:  After the risks and benefits of the procedure were discussed the patient provided informed consent. The patient was prepped and draped in usual sterile fashion. Local anesthesia was administered to an area 2 cm to the left of the sternum in the 4th intercostal space. A cutaneous incision was made using the incision tool. The introducer was then used to create a subcutaneous tunnel and carefully deploy the device. Local pressure was held to ensure hemostasis.  The incision was closed with SteriStrips and a sterile dressing was applied.  R waves 0.45 mV.  Thurmon Fair, MD, [facility] [phone] office [phone] pager 01/02/2021 2:07 PM

## 2021-01-02 NOTE — Progress Notes (Addendum)
STROKE TEAM PROGRESS NOTE   INTERVAL HISTORY No acute events   Afebrile, VSS. On exam, patient is neurologically intact. Subjective left visual field blurred vision, however no visual field deficit noted.  Patient underwent a TEE today which showed positive bubble study consistent with PFO.  Final report pending.  Loop recorder placed today.  Vitals:   01/02/21 1250 01/02/21 1300 01/02/21 1340 01/02/21 1412  BP: (!) 145/82 (!) 163/82  (!) 155/85  Pulse: 69 67  72  Resp: 13 (!) 27  18  Temp:    97.7 F (36.5 C)  TempSrc:    Oral  SpO2: 98% 97% 98% 98%  Weight:      Height:       CBC:  Recent Labs  Lab 12/31/20 1638  WBC 6.2  NEUTROABS 2.9  HGB 14.0  HCT 43.9  MCV 92.6  PLT 322   Basic Metabolic Panel:  Recent Labs  Lab 12/31/20 1638  NA 138  K 3.8  CL 103  CO2 25  GLUCOSE 93  BUN 16  CREATININE 0.56  CALCIUM 9.4   Lipid Panel: Pending HgbA1c:  Recent Labs  Lab 01/01/21 0015  HGBA1C 6.2*   Urine Drug Screen: No results for input(s): LABOPIA, COCAINSCRNUR, LABBENZ, AMPHETMU, THCU, LABBARB in the last 168 hours.  Alcohol Level No results for input(s): ETH in the last 168 hours.  IMAGING  MRA 1. Small acute or early subacute right occipital infarct. 2. Artifact versus stenosis at the anterior genu of the right greater than left internal carotid arteries with artifact favored. Otherwise negative head MRA. 3. Motion degraded neck MRA. Patent cervical carotid and vertebral arteries.  MRI 1. Small acute or early subacute right occipital infarct. 2. Artifact versus stenosis at the anterior genu of the right greater than left internal carotid arteries with artifact favored. Otherwise negative head MRA. 3. Motion degraded neck MRA. Patent cervical carotid and vertebral arteries.  CTH Focal area of decreased attenuation in the posterior aspect of the mid right occipital lobe, felt to represent a recent and potentially acute focal infarct in this area. On  CT, this area of apparent infarct measures approximately 1.5 x 1.5 cm. Elsewhere brain parenchyma appears unremarkable. No mass or hemorrhage.  Lower extremity ultrasound Result read: 01/01/2021 Impression no evidence of deep vein thrombosis in the lower extremity  Echo Result read 01/01/2021 IMPRESSIONS   1. Left ventricular ejection fraction, by estimation, is 60 to 65%. The  left ventricle has normal function. The left ventricle has no regional  wall motion abnormalities. There is mild left ventricular hypertrophy.  Left ventricular diastolic parameters  are indeterminate.  2. Right ventricular systolic function is normal. The right ventricular  size is normal.  3. The mitral valve is normal in structure. Trivial mitral valve  regurgitation.  4. The aortic valve is tricuspid. Aortic valve regurgitation is not  visualized. Mild aortic valve sclerosis is present, with no evidence of  aortic valve stenosis.  5. The inferior vena cava is normal in size with greater than 50%  respiratory variability, suggesting right atrial pressure of 3 mmHg.   EP PPM/ICD IMPLANT Result Date: 01/02/2021 Reason for procedure: Cryptogenic stroke Procedure performed by: Thurmon Fair, MD Complications: None Estimated blood loss: <5 mL Medications administered during procedure: Lidocaine 1% with 1/100,000 epinephrine 10 mL locally Device details: Medtronic Reveal Linq model number [id number], serial number [id number] Procedure details: After the risks and benefits of the procedure were discussed the patient provided informed consent. The patient  was prepped and draped in usual sterile fashion. Local anesthesia was administered to an area 2 cm to the left of the sternum in the 4th intercostal space. A cutaneous incision was made using the incision tool. The introducer was then used to create a subcutaneous tunnel and carefully deploy the device. Local pressure was held to ensure hemostasis. The incision was  closed with SteriStrips and a sterile dressing was applied. R waves 0.45 mV.   PHYSICAL EXAM Constitutional: Appears well-developed and well-nourished.  Psych: Affect appropriate to situation Eyes: No scleral injection HENT: No OP obstruction MSK: no joint deformities.  Cardiovascular: Normal rate and regular rhythm.  Respiratory: Effort normal, non-labored breathing GI: Soft.  No distension. There is no tenderness.  Skin: WDI  Neuro: Mental Status: Patient is awake, alert, oriented to person, place, month, year, and situation. Patient is able to give a clear and coherent history. No signs of aphasia or neglect Cranial Nerves: II: Visual Fields are full. subjective left visual field blurred vision, however no visual field deficit. Pupils are equal, round, and reactive to light.   III,IV, VI: EOMI without ptosis or diploplia.  V: Facial sensation is symmetric to temperature VII: Facial movement is symmetric.  VIII: hearing is intact to voice X: Uvula elevates symmetrically XI: Shoulder shrug is symmetric. XII: tongue is midline without atrophy or fasciculations.  Motor: Tone is normal. Bulk is normal. 5/5 strength was present in all four extremities.  Sensory: Sensation is symmetric to light touch and temperature in the arms and legs. Plantars: Toes are downgoing bilaterally.  Cerebellar: FNF and HKS are intact bilaterally  ASSESSMENT/PLAN Yolanda Gibbs is a 70 y.o. female with a history of hypertension and migraine who presents with visual changes started last Sunday.  She states that she woke up with the symptoms.  She describes the visual change as "zigzag lines" that are more prominent on the left.Given that it was not improving, she saw an optometrist who stated that everything they saw looked normal.  Her blood pressure was elevated so she sought care at her PCPs office.  He prescribed her losartan, but her symptoms did not improve.  Due to her continued symptoms, she  sought care at [facility] where a CT was performed demonstrating a subacute occipital stroke.  Stroke - right occipital small stroke likely due to migraine versus cardioembolic source.  CT - Focal area of decreased attenuation in the posterior aspect of the mid right occipital lobe, felt to represent a recent and potential acute focal infarct in this area  MRI: right occipital small infarct  MRA head and neck: negative  2D Echo: EF 60-65%, mild LVH  LE venous Doppler negative for DVT  VTE prophylaxis - SCDs, Lovenox  TEE: Positive bubble study but negative with color doppler, consistent with small PFO Loop recorder implant (01/02/2021)  On DAPT with plavix and ASA x 3 weeks then ASA alone.  Therapy recommendations: Cleared for home by PT/OT with no needs   Disposition:  Home   We will signoff for now.  We will plan to see the patient in follow-up clinic in 4 weeks.  PFO  PFO found on TEE bubble study today but not on color doppler  Likely small PFO  Will discuss with Dr. Excell Seltzer to see if candidate of PFO closure  Hypertension . stable . Long-term BP goal normotensive  Hyperlipidemia  Home meds:  None  LDL 89  Start atorvastatin 40 mg daily  Continue statin at discharge  Other Stroke Risk Factors  Advanced Age >/= 64   Current ETOH use, 3-4 per week. Advised to limit to one drink per day.   Family hx stroke (father)  Migraines ocular  Other Active Problems  Anxiety/depression: Effexor 75mg  daily  GERD: Protonix daily  Nausea: Reglan  This plan of care was directed by Dr. .  Roda Shutters Olivencia-Simmons, ACNP-BC  Hospital day # 1   ATTENDING NOTE: I reviewed above note and agree with the assessment and plan. Pt was seen and examined.   Daughter at bedside.  Patient back from TEE and loop recorder.  Tolerating procedure pretty well, no LV thrombus or LAA thrombus.  Found to have a small PFO, positive bubble study but negative color  Doppler.  Do not feel like the cause of stroke given her age and size of the PFO, but will run by Dr. Jeanella Cara to see if we are in the same page.  Stroke work-up complete, patient neuro intact, okay to discharge from neuro standpoint.  Patient will follow up with [facility] in 4 weeks.  Continue DAPT for 3 weeks and then aspirin alone.  Continue Lipitor.  For detailed assessment and plan, please refer to above as I have made changes wherever appropriate.   Neurology will sign off. Please call with questions. Pt will follow up with stroke clinic NP at [facility] in about 4 weeks. Thanks for the consult.   [facility], MD PhD Stroke Neurology 01/02/2021 4:08 PM     To contact Stroke Continuity provider, please refer to 01/04/2021. After hours, contact General Neurology

## 2021-01-02 NOTE — Progress Notes (Signed)
  Echocardiogram Echocardiogram Transesophageal has been performed.  Yolanda Gibbs 01/02/2021, 1:59 PM

## 2021-01-02 NOTE — Anesthesia Procedure Notes (Signed)
Procedure Name: MAC Date/Time: 01/02/2021 12:11 PM Performed by: Kathryne Hitch, CRNA Pre-anesthesia Checklist: Patient identified, Emergency Drugs available, Suction available and Patient being monitored Patient Re-evaluated:Patient Re-evaluated prior to induction Oxygen Delivery Method: Nasal cannula Preoxygenation: Pre-oxygenation with 100% oxygen Induction Type: IV induction Placement Confirmation: positive ETCO2 Dental Injury: Teeth and Oropharynx as per pre-operative assessment

## 2021-01-02 NOTE — Plan of Care (Signed)
Discharge instruction given to the patient and she stated understanding. Questions and concerns answered. VS checked and within normal limit. IV and tele discontinued. Patient waiting for her ride.  Problem: Education: Goal: Knowledge of General Education information will improve Description: Including pain rating scale, medication(s)/side effects and non-pharmacologic comfort measures Outcome: Adequate for Discharge   Problem: Health Behavior/Discharge Planning: Goal: Ability to manage health-related needs will improve Outcome: Adequate for Discharge   Problem: Clinical Measurements: Goal: Ability to maintain clinical measurements within normal limits will improve Outcome: Adequate for Discharge Goal: Will remain free from infection Outcome: Adequate for Discharge Goal: Diagnostic test results will improve Outcome: Adequate for Discharge Goal: Respiratory complications will improve Outcome: Adequate for Discharge Goal: Cardiovascular complication will be avoided Outcome: Adequate for Discharge   Problem: Activity: Goal: Risk for activity intolerance will decrease Outcome: Adequate for Discharge   Problem: Nutrition: Goal: Adequate nutrition will be maintained Outcome: Adequate for Discharge   Problem: Coping: Goal: Level of anxiety will decrease Outcome: Adequate for Discharge   Problem: Elimination: Goal: Will not experience complications related to bowel motility Outcome: Adequate for Discharge Goal: Will not experience complications related to urinary retention Outcome: Adequate for Discharge   Problem: Pain Managment: Goal: General experience of comfort will improve Outcome: Adequate for Discharge   Problem: Safety: Goal: Ability to remain free from injury will improve Outcome: Adequate for Discharge   Problem: Skin Integrity: Goal: Risk for impaired skin integrity will decrease Outcome: Adequate for Discharge   Problem: Education: Goal: Knowledge of disease  or condition will improve Outcome: Adequate for Discharge Goal: Knowledge of secondary prevention will improve Outcome: Adequate for Discharge Goal: Knowledge of patient specific risk factors addressed and post discharge goals established will improve Outcome: Adequate for Discharge Goal: Individualized Educational Video(s) Outcome: Adequate for Discharge   Problem: Coping: Goal: Will verbalize positive feelings about self Outcome: Adequate for Discharge Goal: Will identify appropriate support needs Outcome: Adequate for Discharge   Problem: Health Behavior/Discharge Planning: Goal: Ability to manage health-related needs will improve Outcome: Adequate for Discharge   Problem: Self-Care: Goal: Ability to participate in self-care as condition permits will improve Outcome: Adequate for Discharge Goal: Verbalization of feelings and concerns over difficulty with self-care will improve Outcome: Adequate for Discharge Goal: Ability to communicate needs accurately will improve Outcome: Adequate for Discharge   Problem: Nutrition: Goal: Risk of aspiration will decrease Outcome: Adequate for Discharge   Problem: Ischemic Stroke/TIA Tissue Perfusion: Goal: Complications of ischemic stroke/TIA will be minimized Outcome: Adequate for Discharge

## 2021-01-02 NOTE — Anesthesia Preprocedure Evaluation (Addendum)
Anesthesia Evaluation  Patient identified by MRN, date of birth, ID band Patient awake    Reviewed: Allergy & Precautions, NPO status , Patient's Chart, lab work & pertinent test results, reviewed documented beta blocker date and time   Airway Mallampati: III  TM Distance: >3 FB Neck ROM: Full    Dental no notable dental hx. (+) Caps, Dental Advisory Given   Pulmonary neg pulmonary ROS,    Pulmonary exam normal breath sounds clear to auscultation       Cardiovascular hypertension, Pt. on medications Normal cardiovascular exam Rhythm:Regular Rate:Normal  EKG 01/01/21 NSR, normal  TTE 01/01/21 1. Left ventricular ejection fraction, by estimation, is 60 to 65%. The  left ventricle has normal function. The left ventricle has no regional wall motion abnormalities. There is mild left ventricular hypertrophy. left ventricular diastolic parameters are indeterminate.  2. Right ventricular systolic function is normal. The right ventricular size is normal.  3. The mitral valve is normal in structure. Trivial mitral valve regurgitation.  4. The aortic valve is tricuspid. Aortic valve regurgitation is not visualized. Mild aortic valve sclerosis is present, with no evidence of aortic valve stenosis.  5. The inferior vena cava is normal in size with greater than 50% respiratory variability, suggesting right atrial pressure of 3 mmHg.   Neuro/Psych PSYCHIATRIC DISORDERS Depression Right occipital infarct Left visual field changes Blurred vision TIACVA, No Residual Symptoms    GI/Hepatic Neg liver ROS, GERD  Medicated and Controlled,  Endo/Other  Hyperlipidemia  Renal/GU negative Renal ROS  negative genitourinary   Musculoskeletal negative musculoskeletal ROS (+)   Abdominal   Peds  Hematology negative hematology ROS (+) Eliquis therapy- last dose this am   Anesthesia Other Findings   Reproductive/Obstetrics                            Anesthesia Physical Anesthesia Plan  ASA: III  Anesthesia Plan: MAC   Post-op Pain Management:    Induction: Intravenous  PONV Risk Score and Plan: 2 and Propofol infusion and Treatment may vary due to age or medical condition  Airway Management Planned: Natural Airway and Nasal Cannula  Additional Equipment:   Intra-op Plan:   Post-operative Plan:   Informed Consent: I have reviewed the patients History and Physical, chart, labs and discussed the procedure including the risks, benefits and alternatives for the proposed anesthesia with the patient or authorized representative who has indicated his/her understanding and acceptance.     Dental advisory given  Plan Discussed with: CRNA and Anesthesiologist  Anesthesia Plan Comments:        Anesthesia Quick Evaluation

## 2021-01-02 NOTE — Discharge Instructions (Signed)

## 2021-01-02 NOTE — Discharge Summary (Signed)
Physician Discharge Summary  Yolanda Gibbs ZOX:[id number] DOB: 09/20/1950 DOA: 12/31/2020  PCP: Sandford Craze, NP  Admit date: 12/31/2020 Discharge date: 01/02/2021  Admitted From: Home Disposition: Home   Recommendations for Outpatient Follow-up:  1. Follow up with new PCP in the next week for continued management of HTN and prediabetes. Consider dietitian referral.  2. Monitor tolerance/efficacy of atorvastatin (substituted for simvastatin).  3. Follow up with [facility] in 6 weeks.  Home Health: None recommended Equipment/Devices: None recommended Discharge Condition: Stable CODE STATUS: Full Diet recommendation: Heart healthy  Brief/Interim Summary: Yolanda Gibbs is a 70 y.o. female with a history of occipital migraines, idiopathic chronic cough who presented to the ED 5/15 with a week of vision changes described as "blurry vision" on the left visual fields present with both eyes and either eye alone that was not consistent with prior migraines. This prompted an optometry visit where no abnormality was found. Subsequently visited PCP where losartan was started for new-onset elevated BP. Despite taking this, there was no change, so she returned to her PCP where losartan dose was increased and patient sent home. She went to [facility] on 5/15 where CT head was positive for early subacute right occipital infarct. Neurology was consulted and patient admitted at [facility] for work up as detailed below. TEE was pursued revealing PFO without cardioembolic source. Cardiac monitoring revealed no significant dysrhythmias, so loop recorder was implanted on day of discharge.  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  - Augmented simvastatin to atorvastatin (LDL 89).  - PT/OT/SLP without recommendations for further follow up  PFO: Found at time of TEE.  - Follow up with cardiology for consideration of closure.   HTN: BP remains elevated, with goal of normotension now, will continue losartan and home monitoring pending PCP follow up.   Prediabetes: HbA1c has been ~6% for the past year treated with dietary modifications. HbA1c 6.2% here.  - PCP follow up advised, recommend dietitian consultation and to consider metformin.   GERD:  - Continue 2 home medications  Chronic cough: Has had extensive work up with ENT, GI, and visited pulmonary in the past without coming to an underlying diagnosis.  - Outpatient follow up recommended.  Depression:  - Continue venlafaxine  Discharge Instructions Discharge Instructions    Diet - low sodium heart healthy   Complete by: As directed    Discharge instructions   Complete by: As directed    You were evaluated for a right occipital stroke found to have a PFO (hole between upper chambers of the heart) but no blood clots in the legs or heart. A loop recorder was placed to detect abnormal heart rhythms. You will follow up with cardiology for this and further consideration for closing the PFO. You will also need to follow up with neurology in about 6 weeks. If you are not contacted, please call them next week.  - Continue taking losartan to control BP. - Establish care with a PCP as soon as possible and get referral for dietitian.  - Take aspirin  AND plavix  for 3 weeks, then take only aspirin  from there on.  - Change simvastatin to atorvastatin to lower  cholesterol.  - Seek medical attention right away if you develop any changes in vision, speech, or focal numbness or weakness.   It was a pleasure meeting y'all.  - Dr. Jarvis Newcomer   Increase activity slowly   Complete by: As directed      Allergies as  of 01/02/2021      Reactions   Darvon [propoxyphene] Other (See Comments)   "Slept for 3 days"   Duloxetine    Added from [facility]- patient is unsure       Medication List    STOP taking these medications   simvastatin 20 MG tablet Commonly known as: ZOCOR     TAKE these medications   aspirin 81 MG EC tablet Take 1 tablet (81 mg total) by mouth daily. Swallow whole. Start taking on: Jan 03, 2021   atorvastatin 40 MG tablet Commonly known as: LIPITOR Take 1 tablet (40 mg total) by mouth daily. Start taking on: Jan 03, 2021   azelastine 0.1 % nasal spray Commonly known as: ASTELIN Place 2 sprays into both nostrils 2 (two) times daily. Use in each nostril as directed What changed:   when to take this  reasons to take this   CITRACAL +D3 PO Take 1 tablet by mouth daily. Vit d 500 IU, calcium 630mg  each   clopidogrel 75 MG tablet Commonly known as: PLAVIX Take 1 tablet (75 mg total) by mouth daily for 21 days. Start taking on: Jan 03, 2021   Docusate Sodium 100 MG capsule Take 200 mg by mouth at bedtime.   esomeprazole 40 MG capsule Commonly known as: NEXIUM TAKE 1 CAPSULE(40 MG) BY MOUTH DAILY   losartan 50 MG tablet Commonly known as: COZAAR Take 50 mg by mouth daily.   metoCLOPramide 5 MG tablet Commonly known as: REGLAN Take 5 mg by mouth at bedtime.   Multivitamin Adults 50+ Tabs Take 1 tablet by mouth daily.   venlafaxine XR 75 MG 24 hr capsule Commonly known as: EFFEXOR-XR TAKE 1 CAPSULE BY MOUTH DAILY WITH BREAKFAST   Vitamin D (Ergocalciferol) 1.25 MG (50000 UNIT) Caps capsule Commonly known as: DRISDOL Take 50,000 Units by mouth once a week. Take on Tuesdays       Follow-up Information    [facility] Follow up.   Why: 01/16/2021 at 1000 am for post loop recorder wound check Contact information: [address] [place] [phone]        [facility] Follow up in 6 week(s).   Specialty: Neurology Contact information: [address] [place] [phone]             Allergies  Allergen Reactions  . Darvon [Propoxyphene] Other (See Comments)    "Slept for 3 days"  . Duloxetine     Added from [facility]- patient is unsure     Consultations:  Neurology  Cardiology/EP  Procedures/Studies: CT Head Wo Contrast  Result Date: 12/31/2020 CLINICAL DATA:  Headache with blurred vision.  Hypertension EXAM: CT HEAD WITHOUT CONTRAST TECHNIQUE: Contiguous axial images were obtained from the base of the skull through the vertex without intravenous contrast. COMPARISON:  None. FINDINGS: Brain: The ventricles and sulci are normal in size and configuration. There is no intracranial mass, hemorrhage, extra-axial fluid collection, or midline shift. There is a focal area of decreased attenuation in the posterior mid right occipital lobe consistent with a small suspected recent infarct in the right occipital lobe  posteriorly. Elsewhere brain parenchyma appears unremarkable. Vascular: No hyperdense vessel. No appreciable vascular calcification. Skull: Bony calvarium appears intact. There is a midline frontal exostosis measuring 1.3 x 0.6 cm, a benign finding. Sinuses/Orbits: Visualized paranasal sinuses are clear. Orbits appear symmetric bilaterally. Other: Mastoid air cells clear. IMPRESSION: Focal area of decreased attenuation in the posterior aspect of the mid right occipital lobe, felt to represent a recent and potentially acute focal infarct in this area. On CT, this area of apparent infarct measures approximately 1.5 x 1.5 cm. Elsewhere brain parenchyma appears unremarkable. No mass or hemorrhage. Electronically Signed   By: Bretta Bang III M.D.   On: 12/31/2020 17:36   MR ANGIO HEAD WO CONTRAST  Result Date: 01/01/2021 CLINICAL DATA:  Visual changes beginning 1 week ago.  Occipital infarct on CT. EXAM: MRI HEAD WITHOUT CONTRAST MRA HEAD WITHOUT CONTRAST MRA NECK WITHOUT CONTRAST TECHNIQUE: Multiplanar, multiecho pulse sequences of the brain and surrounding structures were obtained without intravenous contrast. Angiographic images of the Circle of Willis were obtained using MRA technique without intravenous contrast. Angiographic images of the neck were obtained using MRA technique without intravenous contrast. Carotid stenosis measurements (when applicable) are obtained utilizing NASCET criteria, using the distal internal carotid diameter as the denominator. COMPARISON:  Head CT 12/31/2020 FINDINGS: MRI HEAD FINDINGS Brain: There is a 1.5 cm acute or early subacute infarct involving cortex and subcortical white matter in the right occipital lobe. The brain is normal in signal elsewhere. No intracranial hemorrhage, mass, midline shift, or extra-axial fluid collection is identified. The ventricles and sulci are normal. Vascular: Major intracranial vascular flow voids are preserved. Skull and upper cervical spine: Unremarkable bone marrow signal. 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 Sinuses/Orbits: Unremarkable orbits. Mild bilateral ethmoid air cell mucosal thickening. Clear mastoid air cells. Other: None. MRA HEAD FINDINGS The study is mildly motion degraded. The intracranial vertebral arteries are widely patent to the basilar with the left being mildly dominant. Patent PICA, AICA, and SCA origins are seen bilaterally. The basilar artery is widely patent. Posterior communicating arteries are not clearly identified and may be diminutive or absent. Both PCAs are patent without evidence of a significant proximal stenosis. The internal carotid arteries are patent from skull base to carotid termini. Focal partial signal loss involving the right greater than left ICAs at the level of the anterior genu may be artifactual due to motion and vessel orientation or indicative of underlying  stenoses with a severe stenosis possible on the right. Of note, no significant atherosclerotic calcification is evident on the comparison CT which would favor the appearance on MRA being artifactual. ACAs and MCAs are patent without evidence of a proximal branch occlusion or flow limiting proximal stenosis. The right A1 segment is hypoplastic and poorly visualized. No aneurysm is identified. MRA NECK FINDINGS Assessment is limited by intermittent mild to moderate motion artifact and noncontrast technique. There is a standard 3 vessel aortic arch. The common carotid and cervical internal carotid arteries are patent without evidence of a dissection or definite significant stenosis allowing for intermittent motion artifact. The vertebral arteries are patent with antegrade flow bilaterally. The proximal V1 segments are poorly seen due to motion artifact. No significant stenosis or dissection is evident involving either vertebral artery more distally in the neck. IMPRESSION: 1. Small acute or early subacute right occipital infarct. 2. Artifact versus stenosis at the anterior genu of the right greater than left internal carotid arteries with artifact favored. Otherwise negative head MRA. 3. Motion degraded neck  MRA. Patent cervical carotid and vertebral arteries. Electronically Signed   By: Sebastian Ache M.D.   On: 01/01/2021 08:51   MR ANGIO NECK WO CONTRAST  Result Date: 01/01/2021 CLINICAL DATA:  Visual changes beginning 1 week ago. Occipital infarct on CT. EXAM: MRI HEAD WITHOUT CONTRAST MRA HEAD WITHOUT CONTRAST MRA NECK WITHOUT CONTRAST TECHNIQUE: Multiplanar, multiecho pulse sequences of the brain and surrounding structures were obtained without intravenous contrast. Angiographic images of the Circle of Willis were obtained using MRA technique without intravenous contrast. Angiographic images of the neck were obtained using MRA technique without intravenous contrast. Carotid stenosis measurements (when  applicable) are obtained utilizing NASCET criteria, using the distal internal carotid diameter as the denominator. COMPARISON:  Head CT 12/31/2020 FINDINGS: MRI HEAD FINDINGS Brain: There is a 1.5 cm acute or early subacute infarct involving cortex and subcortical white matter in the right occipital lobe. The brain is normal in signal elsewhere. No intracranial hemorrhage, mass, midline shift, or extra-axial fluid collection is identified. The ventricles and sulci are normal. Vascular: Major intracranial vascular flow voids are preserved. Skull and upper cervical spine: Unremarkable bone marrow signal. 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 Sinuses/Orbits: Unremarkable orbits. Mild bilateral ethmoid air cell mucosal thickening. Clear mastoid air cells. Other: None. MRA HEAD FINDINGS The study is mildly motion degraded. The intracranial vertebral arteries are widely patent to the basilar with the left being mildly dominant. Patent PICA, AICA, and SCA origins are seen bilaterally. The basilar artery is widely patent. Posterior communicating arteries are not clearly identified and may be diminutive or absent. Both PCAs are patent without evidence of a significant proximal stenosis. The internal carotid arteries are patent from skull base to carotid termini. Focal partial signal loss involving the right greater than left ICAs at the level of the anterior genu may be artifactual due to motion and vessel orientation or indicative of underlying stenoses with a severe stenosis possible on the right. Of note, no significant atherosclerotic calcification is evident on the comparison CT which would favor the appearance on MRA being artifactual. ACAs and MCAs are patent without evidence of a proximal branch occlusion or flow limiting proximal stenosis. The right A1 segment is hypoplastic and poorly visualized. No aneurysm is identified. MRA NECK FINDINGS Assessment is limited by intermittent mild to moderate motion artifact and  noncontrast technique. There is a standard 3 vessel aortic arch. The common carotid and cervical internal carotid arteries are patent without evidence of a dissection or definite significant stenosis allowing for intermittent motion artifact. The vertebral arteries are patent with antegrade flow bilaterally. The proximal V1 segments are poorly seen due to motion artifact. No significant stenosis or dissection is evident involving either vertebral artery more distally in the neck. IMPRESSION: 1. Small acute or early subacute right occipital infarct. 2. Artifact versus stenosis at the anterior genu of the right greater than left internal carotid arteries with artifact favored. Otherwise negative head MRA. 3. Motion degraded neck MRA. Patent cervical carotid and vertebral arteries. Electronically Signed   By: Sebastian Ache M.D.   On: 01/01/2021 08:51   MR BRAIN WO CONTRAST  Result Date: 01/01/2021 CLINICAL DATA:  Visual changes beginning 1 week ago. Occipital infarct on CT. EXAM: MRI HEAD WITHOUT CONTRAST MRA HEAD WITHOUT CONTRAST MRA NECK WITHOUT CONTRAST TECHNIQUE: Multiplanar, multiecho pulse sequences of the brain and surrounding structures were obtained without intravenous contrast. Angiographic images of the Circle of Willis were obtained using MRA technique without intravenous contrast. Angiographic images of the neck  were obtained using MRA technique without intravenous contrast. Carotid stenosis measurements (when applicable) are obtained utilizing NASCET criteria, using the distal internal carotid diameter as the denominator. COMPARISON:  Head CT 12/31/2020 FINDINGS: MRI HEAD FINDINGS Brain: There is a 1.5 cm acute or early subacute infarct involving cortex and subcortical white matter in the right occipital lobe. The brain is normal in signal elsewhere. No intracranial hemorrhage, mass, midline shift, or extra-axial fluid collection is identified. The ventricles and sulci are normal. Vascular: Major  intracranial vascular flow voids are preserved. Skull and upper cervical spine: Unremarkable bone marrow signal. 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 Sinuses/Orbits: Unremarkable orbits. Mild bilateral ethmoid air cell mucosal thickening. Clear mastoid air cells. Other: None. MRA HEAD FINDINGS The study is mildly motion degraded. The intracranial vertebral arteries are widely patent to the basilar with the left being mildly dominant. Patent PICA, AICA, and SCA origins are seen bilaterally. The basilar artery is widely patent. Posterior communicating arteries are not clearly identified and may be diminutive or absent. Both PCAs are patent without evidence of a significant proximal stenosis. The internal carotid arteries are patent from skull base to carotid termini. Focal partial signal loss involving the right greater than left ICAs at the level of the anterior genu may be artifactual due to motion and vessel orientation or indicative of underlying stenoses with a severe stenosis possible on the right. Of note, no significant atherosclerotic calcification is evident on the comparison CT which would favor the appearance on MRA being artifactual. ACAs and MCAs are patent without evidence of a proximal branch occlusion or flow limiting proximal stenosis. The right A1 segment is hypoplastic and poorly visualized. No aneurysm is identified. MRA NECK FINDINGS Assessment is limited by intermittent mild to moderate motion artifact and noncontrast technique. There is a standard 3 vessel aortic arch. The common carotid and cervical internal carotid arteries are patent without evidence of a dissection or definite significant stenosis allowing for intermittent motion artifact. The vertebral arteries are patent with antegrade flow bilaterally. The proximal V1 segments are poorly seen due to motion artifact. No significant stenosis or dissection is evident involving either vertebral artery more distally in the neck. IMPRESSION:  1. Small acute or early subacute right occipital infarct. 2. Artifact versus stenosis at the anterior genu of the right greater than left internal carotid arteries with artifact favored. Otherwise negative head MRA. 3. Motion degraded neck MRA. Patent cervical carotid and vertebral arteries. Electronically Signed   By: Sebastian Ache M.D.   On: 01/01/2021 08:51   EP PPM/ICD IMPLANT  Result Date: 01/02/2021 Reason for procedure: Cryptogenic stroke Procedure performed by: Thurmon Fair, MD Complications: None Estimated blood loss: <5 mL Medications administered during procedure: Lidocaine 1% with 1/100,000 epinephrine 10 mL locally Device details: Medtronic Reveal Linq model number [id number], serial number [id number] Procedure details: After the risks and benefits of the procedure were discussed the patient provided informed consent. The patient was prepped and draped in usual sterile fashion. Local anesthesia was administered to an area 2 cm to the left of the sternum in the 4th intercostal space. A cutaneous incision was made using the incision tool. The introducer was then used to create a subcutaneous tunnel and carefully deploy the device. Local pressure was held to ensure hemostasis. The incision was closed with SteriStrips and a sterile dressing was applied. R waves 0.45 mV.   DG CHEST PORT 1 VIEW  Result Date: 12/31/2020 CLINICAL DATA:  Acute migraine blurred vision, elevated blood pressure EXAM:  PORTABLE CHEST 1 VIEW COMPARISON:  Report from chest radiograph December 17, 2006 however no imaging available for review at time of dictation. FINDINGS: The heart size and mediastinal contours are within normal limits. Pulmonary hyperinflation with chronic appearing bronchitic lung changes. No focal consolidation. No visible pleural effusion or pneumothorax. The visualized skeletal structures are unremarkable. IMPRESSION: 1. No acute cardiopulmonary findings. 2. Pulmonary hyperinflation with chronic appearing  bronchitic lung changes suggestive of chronic obstructive pulmonary disease. Electronically Signed   By: Maudry Mayhew MD   On: 12/31/2020 21:42   ECHOCARDIOGRAM COMPLETE  Result Date: 01/01/2021    ECHOCARDIOGRAM REPORT   Patient Name:   Yolanda Gibbs Date of Exam: 01/01/2021 Medical Rec #:  [id number]      Height:       64.0 in Accession #:    [id number]     Weight:       152.0 lb Date of Birth:  Dec 19, 1950      BSA:          1.741 m Patient Age:    69 years       BP:           147/84 mmHg Patient Gender: F              HR:           73 bpm. Exam Location:  Inpatient Procedure: 2D Echo, Cardiac Doppler and Color Doppler Indications:    Stroke  History:        Patient has no prior history of Echocardiogram examinations.                 Risk Factors:Hypertension.  Sonographer:    Shirlean Kelly Referring Phys: Consepcion Hearing ANASTASSIA DOUTOVA IMPRESSIONS  1. Left ventricular ejection fraction, by estimation, is 60 to 65%. The left ventricle has normal function. The left ventricle has no regional wall motion abnormalities. There is mild left ventricular hypertrophy. Left ventricular diastolic parameters are indeterminate.  2. Right ventricular systolic function is normal. The right ventricular size is normal.  3. The mitral valve is normal in structure. Trivial mitral valve regurgitation.  4. The aortic valve is tricuspid. Aortic valve regurgitation is not visualized. Mild aortic valve sclerosis is present, with no evidence of aortic valve stenosis.  5. The inferior vena cava is normal in size with greater than 50% respiratory variability, suggesting right atrial pressure of 3 mmHg. FINDINGS  Left Ventricle: Left ventricular ejection fraction, by estimation, is 60 to 65%. The left ventricle has normal function. The left ventricle has no regional wall motion abnormalities. The left ventricular internal cavity size was normal in size. There is  mild left ventricular hypertrophy. Left ventricular diastolic parameters are  indeterminate. Right Ventricle: The right ventricular size is normal. Right vetricular wall thickness was not assessed. Right ventricular systolic function is normal. Left Atrium: Left atrial size was normal in size. Right Atrium: Right atrial size was normal in size. Pericardium: Trivial pericardial effusion is present. Mitral Valve: The mitral valve is normal in structure. Trivial mitral valve regurgitation. Tricuspid Valve: The tricuspid valve is normal in structure. Tricuspid valve regurgitation is mild. Aortic Valve: The aortic valve is tricuspid. Aortic valve regurgitation is not visualized. Mild aortic valve sclerosis is present, with no evidence of aortic valve stenosis. Aortic valve mean gradient measures 4.0 mmHg. Aortic valve peak gradient measures 6.2 mmHg. Aortic valve area, by VTI measures 3.02 cm. Pulmonic Valve: The pulmonic valve was not well visualized. Pulmonic valve regurgitation is  not visualized. Aorta: The aortic root is normal in size and structure. Venous: The inferior vena cava is normal in size with greater than 50% respiratory variability, suggesting right atrial pressure of 3 mmHg. IAS/Shunts: No atrial level shunt detected by color flow Doppler.  LEFT VENTRICLE PLAX 2D LVIDd:         3.10 cm  Diastology LVIDs:         2.20 cm  LV e' medial:    5.66 cm/s LV PW:         1.10 cm  LV E/e' medial:  11.9 LV IVS:        1.40 cm  LV e' lateral:   6.09 cm/s LVOT diam:     2.10 cm  LV E/e' lateral: 11.1 LV SV:         79 LV SV Index:   46 LVOT Area:     3.46 cm  RIGHT VENTRICLE             IVC RV S prime:     12.70 cm/s  IVC diam: 1.10 cm TAPSE (M-mode): 2.4 cm LEFT ATRIUM             Index       RIGHT ATRIUM           Index LA diam:        2.80 cm 1.61 cm/m  RA Area:     10.70 cm LA Vol (A2C):   41.6 ml 23.89 ml/m RA Volume:   23.40 ml  13.44 ml/m LA Vol (A4C):   34.8 ml 19.99 ml/m LA Biplane Vol: 38.4 ml 22.06 ml/m  AORTIC VALVE AV Area (Vmax):    3.27 cm AV Area (Vmean):   3.01 cm AV  Area (VTI):     3.02 cm AV Vmax:           124.00 cm/s AV Vmean:          89.300 cm/s AV VTI:            0.263 m AV Peak Grad:      6.2 mmHg AV Mean Grad:      4.0 mmHg LVOT Vmax:         117.00 cm/s LVOT Vmean:        77.700 cm/s LVOT VTI:          0.229 m LVOT/AV VTI ratio: 0.87  AORTA Ao Root diam: 3.20 cm Ao Asc diam:  3.40 cm MITRAL VALVE MV Area (PHT): 4.15 cm    SHUNTS MV Decel Time: 183 msec    Systemic VTI:  0.23 m MV E velocity: 67.30 cm/s  Systemic Diam: 2.10 cm MV A velocity: 74.10 cm/s MV E/A ratio:  0.91 Dietrich Pates MD Electronically signed by Dietrich Pates MD Signature Date/Time: 01/01/2021/2:39:26 PM    Final    VAS [place] LOWER EXTREMITY VENOUS (DVT)  Result Date: 01/01/2021  Lower Venous DVT Study Patient Name:  Yolanda Gibbs  Date of Exam:   01/01/2021 Medical Rec #: [id number]       Accession #:    [id number] Date of Birth: [birth year]       Patient Gender: F Patient Age:   69Y Exam Location:  [facility] Procedure:      VAS [place] LOWER EXTREMITY VENOUS (DVT) Referring Phys: [id number] Marvel Plan --------------------------------------------------------------------------------  Indications: Stroke.  Comparison Study: No prior study Performing Technologist: Gertie Fey MHA, RDMS, RVT, RDCS  Examination Guidelines: A complete evaluation includes B-mode imaging, spectral  Doppler, color Doppler, and power Doppler as needed of all accessible portions of each vessel. Bilateral testing is considered an integral part of a complete examination. Limited examinations for reoccurring indications may be performed as noted. The reflux portion of the exam is performed with the patient in reverse Trendelenburg.  +---------+---------------+---------+-----------+----------+--------------+ RIGHT    CompressibilityPhasicitySpontaneityPropertiesThrombus Aging +---------+---------------+---------+-----------+----------+--------------+ CFV      Full           Yes      Yes                                  +---------+---------------+---------+-----------+----------+--------------+ SFJ      Full                                                        +---------+---------------+---------+-----------+----------+--------------+ FV Prox  Full                                                        +---------+---------------+---------+-----------+----------+--------------+ FV Mid   Full                                                        +---------+---------------+---------+-----------+----------+--------------+ FV DistalFull                                                        +---------+---------------+---------+-----------+----------+--------------+ PFV      Full                                                        +---------+---------------+---------+-----------+----------+--------------+ POP      Full           Yes      Yes                                 +---------+---------------+---------+-----------+----------+--------------+ PTV      Full                                                        +---------+---------------+---------+-----------+----------+--------------+ PERO     Full                                                        +---------+---------------+---------+-----------+----------+--------------+   +---------+---------------+---------+-----------+----------+--------------+  LEFT     CompressibilityPhasicitySpontaneityPropertiesThrombus Aging +---------+---------------+---------+-----------+----------+--------------+ CFV      Full           Yes      Yes                                 +---------+---------------+---------+-----------+----------+--------------+ SFJ      Full                                                        +---------+---------------+---------+-----------+----------+--------------+ FV Prox  Full                                                         +---------+---------------+---------+-----------+----------+--------------+ FV Mid   Full                                                        +---------+---------------+---------+-----------+----------+--------------+ FV DistalFull                                                        +---------+---------------+---------+-----------+----------+--------------+ PFV      Full                                                        +---------+---------------+---------+-----------+----------+--------------+ POP      Full           Yes      Yes                                 +---------+---------------+---------+-----------+----------+--------------+ PTV      Full                                                        +---------+---------------+---------+-----------+----------+--------------+ PERO     Full                                                        +---------+---------------+---------+-----------+----------+--------------+     Summary: RIGHT: - There is no evidence of deep vein thrombosis in the lower extremity.  - No cystic structure found in the popliteal fossa.  LEFT: - There is no evidence of deep vein thrombosis in the lower extremity.  - No  cystic structure found in the popliteal fossa.  *See table(s) above for measurements and observations. Electronically signed by Sherald Hess MD on 01/01/2021 at 4:55:01 PM.    Final    INDICATIONS: CVA  PROCEDURE:   Informed consent was obtained prior to the procedure. The risks, benefits and alternatives for the procedure were discussed and the patient comprehended these risks.  Risks include, but are not limited to, cough, sore throat, vomiting, nausea, somnolence, esophageal and stomach trauma or perforation, bleeding, low blood pressure, aspiration, pneumonia, infection, trauma to the teeth and death.    After a procedural time-out, the oropharynx was anesthetized and the patient was sedated by the  anesthesia service. The transesophageal probe was inserted in the esophagus and stomach without difficulty and multiple views were obtained. Anesthesia was monitored by Dr Malen Gauze and Janann Colonel, CRNA   COMPLICATIONS:    There were no immediate complications.  FINDINGS:  Positive bubble study consistent with PFO  [name] MD   Subjective: Eating lunch after TEE. No new neurological complaints. Excited to return home.  Discharge Exam: Vitals:   01/02/21 1412 01/02/21 1428  BP: (!) 155/85 (!) 160/95  Pulse: 72   Resp: 18   Temp: 97.7 F (36.5 C)   SpO2: 98%    General: No distress Telemetry: Normal sinus rhythm Neuro: No focal deficits in CN and peripheral nerves grossly. Visual acuity not tested.  Labs: BNP (last 3 results) No results for input(s): BNP in the last 8760 hours. Basic Metabolic Panel: Recent Labs  Lab 12/31/20 1638  NA 138  K 3.8  CL 103  CO2 25  GLUCOSE 93  BUN 16  CREATININE 0.56  CALCIUM 9.4   Liver Function Tests: No results for input(s): AST, ALT, ALKPHOS, BILITOT, PROT, ALBUMIN in the last 168 hours. No results for input(s): LIPASE, AMYLASE in the last 168 hours. No results for input(s): AMMONIA in the last 168 hours. CBC: Recent Labs  Lab 12/31/20 1638  WBC 6.2  NEUTROABS 2.9  HGB 14.0  HCT 43.9  MCV 92.6  PLT 322   Cardiac Enzymes: No results for input(s): CKTOTAL, CKMB, CKMBINDEX, TROPONINI in the last 168 hours. BNP: Invalid input(s): POCBNP CBG: No results for input(s): GLUCAP in the last 168 hours. D-Dimer No results for input(s): DDIMER in the last 72 hours. Hgb A1c Recent Labs    01/01/21 0015  HGBA1C 6.2*   Lipid Profile Recent Labs    01/01/21 0015  CHOL 190  HDL 84  LDLCALC 89  TRIG 84  CHOLHDL 2.3   Thyroid function studies No results for input(s): TSH, T4TOTAL, T3FREE, THYROIDAB in the last 72 hours.  Invalid input(s): FREET3 Anemia work up No results for input(s):  VITAMINB12, FOLATE, FERRITIN, TIBC, IRON, RETICCTPCT in the last 72 hours. Urinalysis    Component Value Date/Time   COLORURINE YELLOW 11/01/2015 1006   APPEARANCEUR CLEAR 11/01/2015 1006   LABSPEC 1.020 11/01/2015 1006   PHURINE 7.0 11/01/2015 1006   GLUCOSEU NEGATIVE 11/01/2015 1006   HGBUR NEGATIVE 11/01/2015 1006   BILIRUBINUR neg 04/24/2016 1703   KETONESUR NEGATIVE 11/01/2015 1006   PROTEINUR neg 04/24/2016 1703   UROBILINOGEN 0.2 04/24/2016 1703   UROBILINOGEN 0.2 11/01/2015 1006   NITRITE neg 04/24/2016 1703   NITRITE NEGATIVE 11/01/2015 1006   LEUKOCYTESUR small (1+) (A) 04/24/2016 1703    Microbiology Recent Results (from the past 240 hour(s))  Resp Panel by RT-PCR (Flu A&B, Covid) Nasopharyngeal Swab     Status: None  Collection Time: 12/31/20  6:33 PM   Specimen: Nasopharyngeal Swab; Nasopharyngeal(NP) swabs in vial transport medium  Result Value Ref Range Status   SARS Coronavirus 2 by RT PCR NEGATIVE NEGATIVE Final    Comment: (NOTE) SARS-CoV-2 target nucleic acids are NOT DETECTED.  The SARS-CoV-2 RNA is generally detectable in upper respiratory specimens during the acute phase of infection. The lowest concentration of SARS-CoV-2 viral copies this assay can detect is 138 copies/mL. A negative result does not preclude SARS-Cov-2 infection and should not be used as the sole basis for treatment or other patient management decisions. A negative result may occur with  improper specimen collection/handling, submission of specimen other than nasopharyngeal swab, presence of viral mutation(s) within the areas targeted by this assay, and inadequate number of viral copies(<138 copies/mL). A negative result must be combined with clinical observations, patient history, and epidemiological information. The expected result is Negative.  Fact Sheet for Patients:  BloggerCourse.comhttps://www.fda.gov/media/152166/download  Fact Sheet for Healthcare Providers:   SeriousBroker.ithttps://www.fda.gov/media/152162/download  This test is no t yet approved or cleared by the [place] FDA and  has been authorized for detection and/or diagnosis of SARS-CoV-2 by FDA under an Emergency Use Authorization (EUA). This EUA will remain  in effect (meaning this test can be used) for the duration of the COVID-19 declaration under Section 564(b)(1) of the Act, 21 U.S.C.section 360bbb-3(b)(1), unless the authorization is terminated  or revoked sooner.       Influenza A by PCR NEGATIVE NEGATIVE Final   Influenza B by PCR NEGATIVE NEGATIVE Final    Comment: (NOTE) The Xpert Xpress SARS-CoV-2/FLU/RSV plus assay is intended as an aid in the diagnosis of influenza from Nasopharyngeal swab specimens and should not be used as a sole basis for treatment. Nasal washings and aspirates are unacceptable for Xpert Xpress SARS-CoV-2/FLU/RSV testing.  Fact Sheet for Patients: BloggerCourse.comhttps://www.fda.gov/media/152166/download  Fact Sheet for Healthcare Providers: SeriousBroker.ithttps://www.fda.gov/media/152162/download  This test is not yet approved or cleared by the [place] FDA and has been authorized for detection and/or diagnosis of SARS-CoV-2 by FDA under an Emergency Use Authorization (EUA). This EUA will remain in effect (meaning this test can be used) for the duration of the COVID-19 declaration under Section 564(b)(1) of the Act, 21 U.S.C. section 360bbb-3(b)(1), unless the authorization is terminated or revoked.  Performed [facility] [place], [address] [place], [place] [id number]     Time coordinating discharge: Approximately 40 minutes  Tyrone Nineyan B Kenidi Elenbaas, MD  [facility] 01/02/2021, 3:24 PM

## 2021-01-02 NOTE — Op Note (Signed)
LOOP RECORDER IMPLANT   Procedure report  Procedure performed:  Loop recorder implantation   Reason for procedure:  Cryptogenic stroke Procedure performed by:  Thurmon Fair, MD  Complications:  None  Estimated blood loss:  <5 mL  Medications administered during procedure:  Lidocaine 1% with 1/100,000 epinephrine 10 mL locally Device details:  Medtronic Reveal Linq model number [id number], serial number [id number] Procedure details:  After the risks and benefits of the procedure were discussed the patient provided informed consent. The patient was prepped and draped in usual sterile fashion. Local anesthesia was administered to an area 2 cm to the left of the sternum in the 4th intercostal space. A cutaneous incision was made using the incision tool. The introducer was then used to create a subcutaneous tunnel and carefully deploy the device. Local pressure was held to ensure hemostasis.  The incision was closed with SteriStrips and a sterile dressing was applied.  R waves 0.45 mV.  Thurmon Fair, MD, [facility] [phone] office [phone] pager 01/02/2021 2:06 PM

## 2021-01-02 NOTE — Interval H&P Note (Signed)
History and Physical Interval Note:  01/02/2021 12:04 PM  Yolanda Gibbs  has presented today for surgery, with the diagnosis of STROKE.  The various methods of treatment have been discussed with the patient and family. After consideration of risks, benefits and other options for treatment, the patient has consented to  Procedure(s): TRANSESOPHAGEAL ECHOCARDIOGRAM (TEE) (N/A) as a surgical intervention.  The patient's history has been reviewed, patient examined, no change in status, stable for surgery.  I have reviewed the patient's chart and labs.  Questions were answered to the patient's satisfaction.     [name]

## 2021-01-02 NOTE — Anesthesia Postprocedure Evaluation (Signed)
Anesthesia Post Note  Patient: Yolanda Gibbs  Procedure(s) Performed: TRANSESOPHAGEAL ECHOCARDIOGRAM (TEE) (N/A ) BUBBLE STUDY     Patient location during evaluation: PACU Anesthesia Type: MAC Level of consciousness: awake and alert and oriented Pain management: pain level controlled Vital Signs Assessment: post-procedure vital signs reviewed and stable Respiratory status: spontaneous breathing, nonlabored ventilation and respiratory function stable Cardiovascular status: stable and blood pressure returned to baseline Postop Assessment: no apparent nausea or vomiting Anesthetic complications: no   No complications documented.  Last Vitals:  Vitals:   01/02/21 1138 01/02/21 1240  BP: (!) 167/92 (!) 145/90  Pulse: 76 65  Resp: 13 19  Temp: 36.7 C 36.7 C  SpO2: 99% 96%    Last Pain:  Vitals:   01/02/21 1240  TempSrc: Temporal  PainSc: 0-No pain   Pain Goal:                   Remingtyn Depaola A.

## 2021-01-03 ENCOUNTER — Emergency Department (HOSPITAL_BASED_OUTPATIENT_CLINIC_OR_DEPARTMENT_OTHER)
Admission: EM | Admit: 2021-01-03 | Discharge: 2021-01-03 | Disposition: A | Discharge status: Home / Self Care | Payer: Medicare Other | Attending: Emergency Medicine | Admitting: Emergency Medicine

## 2021-01-03 ENCOUNTER — Other Ambulatory Visit: Payer: Self-pay

## 2021-01-03 ENCOUNTER — Encounter (HOSPITAL_BASED_OUTPATIENT_CLINIC_OR_DEPARTMENT_OTHER): Payer: Self-pay | Admitting: Emergency Medicine

## 2021-01-03 DIAGNOSIS — Z79899 Other long term (current) drug therapy: Secondary | ICD-10-CM | POA: Diagnosis not present

## 2021-01-03 DIAGNOSIS — I10 Essential (primary) hypertension: Secondary | ICD-10-CM | POA: Diagnosis not present

## 2021-01-03 DIAGNOSIS — T82838A Hemorrhage of vascular prosthetic devices, implants and grafts, initial encounter: Secondary | ICD-10-CM | POA: Insufficient documentation

## 2021-01-03 DIAGNOSIS — Y848 Other medical procedures as the cause of abnormal reaction of the patient, or of later complication, without mention of misadventure at the time of the procedure: Secondary | ICD-10-CM | POA: Insufficient documentation

## 2021-01-03 DIAGNOSIS — Z7982 Long term (current) use of aspirin: Secondary | ICD-10-CM | POA: Insufficient documentation

## 2021-01-03 DIAGNOSIS — Z7902 Long term (current) use of antithrombotics/antiplatelets: Secondary | ICD-10-CM | POA: Insufficient documentation

## 2021-01-03 DIAGNOSIS — R58 Hemorrhage, not elsewhere classified: Secondary | ICD-10-CM

## 2021-01-03 MED ORDER — LIDOCAINE-EPINEPHRINE (PF) 2 %-1:200000 IJ SOLN
INTRAMUSCULAR | Status: AC
  Filled 2021-01-03: qty 20

## 2021-01-03 MED ORDER — LIDOCAINE-EPINEPHRINE 2 %-1:100000 IJ SOLN
20.0000 mL | Freq: Once | INTRAMUSCULAR | Status: AC
  Administered 2021-01-03: 20 mL

## 2021-01-03 NOTE — ED Provider Notes (Signed)
[facility] EMERGENCY DEPARTMENT Provider Note   CSN: [id number] Arrival date & time: 01/03/21  0802     History Chief Complaint  Patient presents with  . Coagulation Disorder    Jackelyn Illingworth is a 70 y.o. female.  Presents to ER with concern for bleeding from the surgical incision site of her loop recorder.  Patient reports that she had a loop recorder implanted yesterday, no known complications with the procedure.  This morning she noted excessive amount of bleeding from the surgical site.  States that her dressing was soaked through.  She was recently admitted for CVA, started on Plavix.  She denies any other symptoms today.  Has been doing well otherwise since discharge.  HPI     Past Medical History:  Diagnosis Date  . Allergy    seasonal  . Depression   . GERD (gastroesophageal reflux disease)   . History of chicken pox   . History of colon polyps    benign  . Hypertension     Patient Active Problem List   Diagnosis Date Noted  . Essential hypertension 01/01/2021  . GERD (gastroesophageal reflux disease) 01/01/2021  . Cerebrovascular accident (CVA) (HCC) 01/01/2021  . CVA (cerebral vascular accident) (HCC) 12/31/2020  . Hot flashes 12/08/2015  . Laryngopharyngeal reflux (LPR) 05/24/2015  . Hyperlipidemia 10/27/2014  . Preventative health care 10/26/2014  . History of colonic polyps 08/29/2014  . Depression 08/29/2014  . Allergic rhinitis 09/21/2007  . ESOPHAGEAL REFLUX 09/21/2007  . COUGH 09/21/2007    Past Surgical History:  Procedure Laterality Date  . BUBBLE STUDY  01/02/2021   Procedure: BUBBLE STUDY;  Surgeon: [name], MD;  Location: [facility] ENDOSCOPY;  Service: Cardiovascular;;  . LOOP RECORDER INSERTION N/A 01/02/2021   Procedure: LOOP RECORDER INSERTION;  Surgeon: Thurmon Fair, MD;  Location: [facility] INVASIVE CV LAB;  Service: Cardiovascular;  Laterality: N/A;  . TEE WITHOUT CARDIOVERSION N/A 01/02/2021   Procedure:  TRANSESOPHAGEAL ECHOCARDIOGRAM (TEE);  Surgeon: [name], MD;  Location: [facility] ENDOSCOPY;  Service: Cardiovascular;  Laterality: N/A;     OB History   No obstetric history on file.     Family History  Problem Relation Age of Onset  . Arthritis Mother   . CAD Mother   . AAA (abdominal aortic aneurysm) Mother   . Cancer Mother        lung  . Stroke Father   . Cancer Maternal Aunt        breast  . Arthritis Maternal Grandmother   . Cancer Maternal Grandfather        prostate  . Depression Paternal Grandmother     Social History   Tobacco Use  . Smoking status: Never Smoker  . Smokeless tobacco: Never Used  Vaping Use  . Vaping Use: Never used  Substance Use Topics  . Alcohol use: Yes    Alcohol/week: 3.0 - 4.0 standard drinks    Types: 3 - 4 Standard drinks or equivalent per week    Comment: occasional  . Drug use: No    Home Medications Prior to Admission medications   Medication Sig Start Date End Date Taking? Authorizing Provider  aspirin EC 81 MG EC tablet Take 1 tablet (81 mg total) by mouth daily. Swallow whole. 01/03/21   Tyrone Nine, MD  atorvastatin (LIPITOR) 40 MG tablet Take 1 tablet (40 mg total) by mouth daily. 01/03/21   Tyrone Nine, MD  azelastine (ASTELIN) 0.1 % nasal spray Place 2 sprays into  both nostrils 2 (two) times daily. Use in each nostril as directed Patient taking differently: Place 2 sprays into both nostrils 2 (two) times daily as needed for allergies. Use in each nostril as directed 03/18/17   Saguier, Ramon Dredge, PA-C  Calcium-Phosphorus-Vitamin D (CITRACAL +D3 PO) Take 1 tablet by mouth daily. Vit d 500 IU, calcium 630mg  each    [provider]  clopidogrel (PLAVIX) 75 MG tablet Take 1 tablet (75 mg total) by mouth daily for 21 days. 01/03/21 01/24/21  03/26/21, MD  Docusate Sodium 100 MG capsule Take 200 mg by mouth at bedtime.    [provider]  esomeprazole (NEXIUM) 40 MG capsule TAKE 1 CAPSULE(40 MG) BY  MOUTH DAILY 07/07/18   07/09/18, NP  losartan (COZAAR) 50 MG tablet Take 50 mg by mouth daily. 12/29/20   [provider]  metoCLOPramide (REGLAN) 5 MG tablet Take 5 mg by mouth at bedtime.    [provider]  Multiple Vitamins-Minerals (MULTIVITAMIN ADULTS 50+) TABS Take 1 tablet by mouth daily.    [provider]  venlafaxine XR (EFFEXOR-XR) 75 MG 24 hr capsule TAKE 1 CAPSULE BY MOUTH DAILY WITH BREAKFAST 07/08/18   07/10/18, NP  Vitamin D, Ergocalciferol, (DRISDOL) 1.25 MG (50000 UNIT) CAPS capsule Take 50,000 Units by mouth once a week. Take on Tuesdays 12/26/20   [provider]    Allergies    Darvon [propoxyphene] and Duloxetine  Review of Systems   Review of Systems  Constitutional: Negative for chills and fever.  HENT: Negative for ear pain and sore throat.   Eyes: Negative for pain and visual disturbance.  Respiratory: Negative for cough and shortness of breath.   Cardiovascular: Negative for chest pain and palpitations.  Gastrointestinal: Negative for abdominal pain and vomiting.  Genitourinary: Negative for dysuria and hematuria.  Musculoskeletal: Negative for arthralgias and back pain.  Skin: Positive for wound. Negative for color change and rash.  Neurological: Negative for seizures and syncope.  All other systems reviewed and are negative.   Physical Exam Updated Vital Signs BP (!) 147/92 (BP Location: Right Arm)   Pulse 74   Temp 98.3 F (36.8 C) (Oral)   Resp 18   Ht 5\' 4"  (1.626 m)   Wt 68.9 kg   LMP 08/19/2004   SpO2 98%   BMI 26.09 kg/m   Physical Exam Vitals and nursing note reviewed.  Constitutional:      General: She is not in acute distress.    Appearance: She is well-developed.  HENT:     Head: Normocephalic and atraumatic.  Eyes:     Conjunctiva/sclera: Conjunctivae normal.  Cardiovascular:     Rate and Rhythm: Normal rate and regular rhythm.     Heart sounds: No murmur  heard.   Pulmonary:     Effort: Pulmonary effort is normal. No respiratory distress.     Breath sounds: Normal breath sounds.  Chest:    Abdominal:     Palpations: Abdomen is soft.     Tenderness: There is no abdominal tenderness.  Musculoskeletal:     Cervical back: Neck supple.  Skin:    General: Skin is warm and dry.  Neurological:     Mental Status: She is alert.     ED Results / Procedures / Treatments   Labs (all labs ordered are listed, but only abnormal results are displayed) Labs Reviewed - No data to display  EKG None  Radiology EP PPM/ICD IMPLANT  Result Date: 01/02/2021  Reason for procedure: Cryptogenic stroke Procedure performed by: Thurmon Fair, MD Complications: None Estimated blood loss: <5 mL Medications administered during procedure: Lidocaine 1% with 1/100,000 epinephrine 10 mL locally Device details: Medtronic Reveal Linq model number [id number], serial number [id number] Procedure details: After the risks and benefits of the procedure were discussed the patient provided informed consent. The patient was prepped and draped in usual sterile fashion. Local anesthesia was administered to an area 2 cm to the left of the sternum in the 4th intercostal space. A cutaneous incision was made using the incision tool. The introducer was then used to create a subcutaneous tunnel and carefully deploy the device. Local pressure was held to ensure hemostasis. The incision was closed with SteriStrips and a sterile dressing was applied. R waves 0.45 mV.   ECHO TEE  Result Date: 01/02/2021    TRANSESOPHOGEAL ECHO REPORT   Patient Name:   HAE AHLERS Date of Exam: 01/02/2021 Medical Rec #:  [id number]      Height:       64.0 in Accession #:    [id number]     Weight:       152.0 lb Date of Birth:  07-04-1951      BSA:          1.741 m Patient Age:    67 years       BP:           135/82 mmHg Patient Gender: F              HR:           74 bpm. Exam Location:  Inpatient Procedure:  Transesophageal Echo, Color Doppler, Cardiac Doppler and Saline            Contrast Bubble Study Indications:     Stroke  History:         Patient has prior history of Echocardiogram examinations, most                  recent 01/01/2021. Risk Factors:Hypertension.  Sonographer:     Eulah Pont RDCS Referring Phys:  [id number] Manson Passey Diagnosing Phys: [name] MD PROCEDURE: After discussion of the risks and benefits of a TEE, an informed consent was obtained from the patient. The transesophogeal probe was passed without difficulty through the esophogus of the patient. Local oropharyngeal anesthetic was provided with Cetacaine. Sedation performed by different physician. The patient was monitored while under deep sedation. Anesthestetic sedation was provided intravenously by Anesthesiology: 175.03mg  of Propofol, 80mg  of Lidocaine. The patient's vital signs; including heart rate, blood pressure, and oxygen saturation; remained stable throughout the procedure. The patient developed no complications during the procedure. IMPRESSIONS  1. Left ventricular ejection fraction, by estimation, is 60 to 65%. The left ventricle has normal function. The left ventricle has no regional wall motion abnormalities.  2. Right ventricular systolic function is normal. The right ventricular size is mildly enlarged.  3. No left atrial/left atrial appendage thrombus was detected.  4. The mitral valve is normal in structure. Trivial mitral valve regurgitation.  5. The aortic valve is tricuspid. Aortic valve regurgitation is not visualized. No aortic stenosis is present.  6. Agitated saline contrast bubble study was positive with shunting observed within 3-6 cardiac cycles suggestive of interatrial shunt. Consistent with PFO. FINDINGS  Left Ventricle: Left ventricular ejection fraction, by estimation, is 60 to 65%. The left ventricle has normal function. The left ventricle has no regional wall motion abnormalities. The  left ventricular internal  cavity size was normal in size. Right Ventricle: The right ventricular size is mildly enlarged. No increase in right ventricular wall thickness. Right ventricular systolic function is normal. Left Atrium: Left atrial size was normal in size. No left atrial/left atrial appendage thrombus was detected. Right Atrium: Right atrial size was normal in size. Pericardium: There is no evidence of pericardial effusion. Mitral Valve: The mitral valve is normal in structure. Trivial mitral valve regurgitation. Tricuspid Valve: The tricuspid valve is normal in structure. Tricuspid valve regurgitation is trivial. Aortic Valve: The aortic valve is tricuspid. Aortic valve regurgitation is not visualized. No aortic stenosis is present. Pulmonic Valve: The pulmonic valve was not well visualized. Pulmonic valve regurgitation is not visualized. Aorta: The aortic root and ascending aorta are structurally normal, with no evidence of dilitation. IAS/Shunts: Evidence of atrial level shunting detected by color flow Doppler. Agitated saline contrast was given intravenously to evaluate for intracardiac shunting. Agitated saline contrast bubble study was positive with shunting observed within 3-6 cardiac cycles suggestive of interatrial shunt. [name] MD Electronically signed by [name] MD Signature Date/Time: 01/02/2021/5:51:53 PM    Final     Procedures .Marland KitchenLaceration Repair  Date/Time: 01/04/2021 7:19 AM Performed by: Milagros Loll, MD Authorized by: Milagros Loll, MD   Consent:    Consent obtained:  Verbal   Consent given by:  Patient   Risks, benefits, and alternatives were discussed: yes     Risks discussed:  Infection, need for additional repair, nerve damage, poor wound healing, poor cosmetic result and pain   Alternatives discussed:  No treatment Universal protocol:    Patient identity confirmed:  Verbally with patient Anesthesia:    Anesthesia method:  Local  infiltration   Local anesthetic:  Lidocaine 1% WITH epi Laceration details:    Location:  Trunk   Trunk location:  L chest   Length (cm):  1 Exploration:    Hemostasis achieved with:  Direct pressure Treatment:    Area cleansed with:  Povidone-iodine   Amount of cleaning:  Extensive   Irrigation solution:  Sterile saline   Irrigation method:  Syringe   Visualized foreign bodies/material removed: no     Debridement:  None   Undermining:  None Skin repair:    Repair method:  Sutures   Suture size:  5-0   Suture material:  Nylon   Number of sutures:  2 Approximation:    Approximation:  Close Repair type:    Repair type:  Simple Post-procedure details:    Procedure completion:  Tolerated     Medications Ordered in ED Medications  lidocaine-EPINEPHrine (XYLOCAINE W/EPI) 2 %-1:100000 (with pres) injection 20 mL (20 mLs Infiltration Given 01/03/21 0851)    ED Course  I have reviewed the triage vital signs and the nursing notes.  Pertinent labs & imaging results that were available during my care of the patient were reviewed by me and considered in my medical decision making (see chart for details).    MDM Rules/Calculators/A&P                         70 year old lady recent CVA on Plavix presented to ER with concern for bleeding from loop recorder site.  On physical exam, there was steady venous ooze from the incision site.  It appears the site was originally closed with Steri-Strips but these are soaked with blood and are no longer functioning.  To achieve hemostasis, placed to simple interrupted sutures to close  the wound.  Recommended patient follow-up with her cardiologist and primary care doctor.  No ongoing bleeding, discharged home.   After the discussed management above, the patient was determined to be safe for discharge.  The patient was in agreement with this plan and all questions regarding their care were answered.  ED return precautions were discussed and the  patient will return to the ED with any significant worsening of condition.   Final Clinical Impression(s) / ED Diagnoses Final diagnoses:  Bleeding    Rx / DC Orders ED Discharge Orders    None       Milagros Lollykstra, Subrina Vecchiarelli S, MD 01/04/21 [phone]

## 2021-01-03 NOTE — ED Notes (Signed)
Lidocaine placed at bedside for provider

## 2021-01-03 NOTE — Discharge Instructions (Addendum)
Follow-up with your cardiologist and primary care doctor.  You should have your incision site rechecked and likely sutures removed in approximately 1 week.  Keep your wound clean and dry.  Can with gentle water, avoid vigorous scrubbing.  If it starts bleeding and does not respond to direct pressure, come back to ER for reassessment.

## 2021-01-03 NOTE — ED Triage Notes (Signed)
States she had a loop recorder implanted yesterday and the area has been bleeding since yesterday. She takes Plavix. Denies pain.

## 2021-01-08 ENCOUNTER — Telehealth: Payer: Self-pay

## 2021-01-08 NOTE — Telephone Encounter (Signed)
See below. Per Dr. Roda Shutters, scheduled patient for PFO Consult with Dr. Excell Seltzer 02/21/21. She was grateful for call and agrees with plan.      ----- Message -----  From: Marvel Plan, MD  Sent: 01/03/2021  6:34 PM EDT  To: Tonny Bollman, MD  Subject: RE: Inpatient Notes                Hi, Yolanda Gibbs, if you can see her, that would be great. I think PFO closure is very reasonable given she has not much risk factors. Please let your scheduler contact her. Thanks. - [name]   ----- Message -----  From: Tonny Bollman, MD  Sent: 01/03/2021  2:28 PM EDT  To: Marvel Plan, MD  Subject: RE: Inpatient Notes                Reviewed TEE. Definite PFO with color and bubble confirmation, moderate in size. I'm never sure the right thing in patient's of this age, but generally lean towards offering closure if loop shows no afib x 3 months as long as they don't have a bad vascular risk profile. I'd be happy to see her to discuss the role of PFO closure if you would like. Just let me know.   Thx Yolanda Gibbs  ----- Message -----  From: Marvel Plan, MD  Sent: 01/02/2021  4:15 PM EDT  To: Tonny Bollman, MD  Subject: Inpatient Notes                  Hi, Yolanda Gibbs - have a pt with small right occipital cortical stroke. She is 69yo, hx of occular migraine, HTN, not much other risk factors. TEE showed PFO with positive bubble but negative color doppler, likely small PFO. Had loop recorder. Like to run by you to see if she is candidate for PFO closure. I feel by her age and size of PFO, likely not candidate but she does not have much risk factors that kind of puzzled me. Let me know what your thoughts. Thanks. - [name]

## 2021-01-16 ENCOUNTER — Other Ambulatory Visit: Payer: Self-pay

## 2021-01-16 ENCOUNTER — Ambulatory Visit (INDEPENDENT_AMBULATORY_CARE_PROVIDER_SITE_OTHER): Payer: Medicare Other | Admitting: Emergency Medicine

## 2021-01-16 DIAGNOSIS — I639 Cerebral infarction, unspecified: Secondary | ICD-10-CM

## 2021-01-16 LAB — CUP PACEART INCLINIC DEVICE CHECK
Date Time Interrogation Session: 20220531102809
Implantable Pulse Generator Implant Date: 20220517

## 2021-01-16 NOTE — Patient Instructions (Signed)
Device Clinic: [phone] 

## 2021-01-16 NOTE — Progress Notes (Signed)
ILR wound check in clinic. Steri strips removed prior to visit. Wound well healed. Home monitor transmitting nightly. No episodes. Questions answered. 

## 2021-02-05 ENCOUNTER — Ambulatory Visit (INDEPENDENT_AMBULATORY_CARE_PROVIDER_SITE_OTHER): Payer: Medicare Other

## 2021-02-05 DIAGNOSIS — I639 Cerebral infarction, unspecified: Secondary | ICD-10-CM

## 2021-02-06 LAB — CUP PACEART REMOTE DEVICE CHECK
Date Time Interrogation Session: 20220620095529
Implantable Pulse Generator Implant Date: 20220517

## 2021-02-14 ENCOUNTER — Encounter: Payer: Self-pay | Admitting: Adult Health

## 2021-02-14 ENCOUNTER — Ambulatory Visit (INDEPENDENT_AMBULATORY_CARE_PROVIDER_SITE_OTHER): Payer: Medicare Other | Admitting: Adult Health

## 2021-02-14 VITALS — BP 131/82 | HR 89 | Ht 64.0 in | Wt 148.0 lb

## 2021-02-14 DIAGNOSIS — I639 Cerebral infarction, unspecified: Secondary | ICD-10-CM

## 2021-02-14 DIAGNOSIS — Q211 Atrial septal defect: Secondary | ICD-10-CM

## 2021-02-14 DIAGNOSIS — I10 Essential (primary) hypertension: Secondary | ICD-10-CM

## 2021-02-14 DIAGNOSIS — E785 Hyperlipidemia, unspecified: Secondary | ICD-10-CM

## 2021-02-14 DIAGNOSIS — Q2112 Patent foramen ovale: Secondary | ICD-10-CM

## 2021-02-14 NOTE — Progress Notes (Signed)
[facility] [address] [place]. [place] [address] [phone]       HOSPITAL FOLLOW UP NOTE  [name] Date of Birth:  [birth year] Medical Record Number:  [id number]   Reason for Referral:  hospital stroke follow up    SUBJECTIVE:   CHIEF COMPLAINT:  Chief Complaint  Patient presents with   Follow-up    Tr alone Pt is well and stable, just having blurred vision and fatigue     HPI:   Yolanda Gibbs is a 69 y.o. female with a history of hypertension and migraine who presented on 12/31/2020 with 1 week history of visual changes describing and zigzag lines more prominent in the left.  Personally reviewed hospitalization pertinent progress notes, lab work and imaging with summary provided.  She initially was seen by her optometrist with no abnormality noted.  Was seen by PCP due to elevated BP no started on losartan but symptoms did not improve.  She eventually sought care at [facility] for CT head showed subacute occipital stroke and transferred to [facility].  Evaluated by Dr. Roda Shutters with right occipital stroke likely due to migraine vs cardioembolic source.  TEE showed small PFO evaluated by cardiology Dr. Excell Seltzer for possible closure although low suspicion stroke etiology given size and age.  Placement of loop recorder for possible atrial fibrillation and stroke etiology.  Recommended DAPT for 3 weeks and aspirin HTN stable.  LDL 89 and started atorvastatin 40 mg daily.  Other stroke risk factors include advanced age, current EtOH use (3-4 drinks/week), family history of stroke and ocular migraines.  Residual subjective left visual field blurred vision (unable to appreciate visual field deficit on exam).  Evaluated by therapies and discharged home in stable condition without therapy needs.  Stroke - right occipital small stroke likely due to migraine versus cardioembolic source. CT - Focal area of decreased attenuation in the posterior aspect of the mid  right occipital lobe, felt to represent a recent and potential acute focal infarct in this area MRI: right occipital small infarct MRA head and neck: negative 2D Echo: EF 60-65%, mild LVH LE venous Doppler negative for DVT VTE prophylaxis - SCDs, Lovenox TEE: Positive bubble study but negative with color doppler, consistent with small PFO Loop recorder implant (01/02/2021) A1c 6.2 LDL 89 On DAPT with plavix and ASA x 3 weeks then ASA alone. Therapy recommendations: Cleared for home by PT/OT with no needs Disposition:  Home   Today, 02/14/2021, Yolanda Gibbs is being seen for hospital follow-up unaccompanied.  She has been doing well since discharge without new stroke/TIA symptoms.  She does report residual left peripheral field blurred vision as well as fatigue and some short-term memory loss since her stroke.  She has returned back to all prior activities including driving and working.  She will occasionally experience a frontal headache after prolonged eyestrain and looking at the computer during work but will typically resolve on own or with Tylenol.  She has not experienced any additional migraines or visual auras.  She did have mild memory loss prior to her stroke but more likely age-related.  She has noticed herself fatiguing quicker with normal activities throughout the day -she did not have any fatigue concerns previously.  Completed 3 weeks DAPT and remains on aspirin alone as well as atorvastatin without associated side effects.  Blood pressure today 131/82.  Loop recorder has not shown atrial fibrillation thus far. Per reivew of epic, Dr. Excell Seltzer further reviewed TEE with definite PFO  with color and verbal confirmation moderate size and has scheduled evaluation 7/6 to further discuss possible closure.  No further concerns at this time.    ROS:   14 system review of systems performed and negative with exception of those listed in HPI  PMH:  Past Medical History:  Diagnosis Date   Allergy     seasonal   Depression    GERD (gastroesophageal reflux disease)    History of chicken pox    History of colon polyps    benign   Hypertension     PSH:  Past Surgical History:  Procedure Laterality Date   BUBBLE STUDY  01/02/2021   Procedure: BUBBLE STUDY;  Surgeon: Little IshikawaSchumann, Christopher L, MD;  [facility];  Service: Cardiovascular;;   LOOP RECORDER INSERTION N/A 01/02/2021   Procedure: LOOP RECORDER INSERTION;  Surgeon: Thurmon Fairroitoru, Mihai, MD;  Location: [facility] INVASIVE CV LAB;  Service: Cardiovascular;  Laterality: N/A;   TEE WITHOUT CARDIOVERSION N/A 01/02/2021   Procedure: TRANSESOPHAGEAL ECHOCARDIOGRAM (TEE);  Surgeon: Little IshikawaSchumann, Christopher L, MD;  [facility];  Service: Cardiovascular;  Laterality: N/A;    Social History:  Social History   Socioeconomic History   Marital status: Married    Spouse name: Not on file   Number of children: Not on file   Years of education: Not on file   Highest education level: Not on file  Occupational History   Not on file  Tobacco Use   Smoking status: Never   Smokeless tobacco: Never  Vaping Use   Vaping Use: Never used  Substance and Sexual Activity   Alcohol use: Yes    Alcohol/week: 3.0 - 4.0 standard drinks    Types: 3 - 4 Standard drinks or equivalent per week    Comment: occasional   Drug use: No   Sexual activity: Not on file  Other Topics Concern   Not on file  Social History Narrative   Daughter at [employer] (Julian Hyessa) born [id number]   Daughter Erin [id number]   Husband is a Curatormechanic, has a Forensic scientistnapa store, has a Landscape architectdealership license helps with day to day of the business   Second marriage- prior to 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   Completed college   Enjoys watching old movies, needlework, cleaning her house   No pets.    Social Determinants of Health   Financial Resource Strain: Not on file  Food Insecurity: Not on file  Transportation Needs: Not on file  Physical Activity: Not on file  Stress: Not on  file  Social Connections: Not on file  Intimate Partner Violence: Not on file    Family History:  Family History  Problem Relation Age of Onset   Arthritis Mother    CAD Mother    AAA (abdominal aortic aneurysm) Mother    Cancer Mother        lung   Stroke Father    Cancer Maternal Aunt        breast   Arthritis Maternal Grandmother    Cancer Maternal Grandfather        prostate   Depression Paternal Grandmother     Medications:   Current Outpatient Medications on File Prior to Visit  Medication Sig Dispense Refill   aspirin EC 81 MG EC tablet Take 1 tablet (81 mg total) by mouth daily. Swallow whole. 30 tablet 1   atorvastatin (LIPITOR) 40 MG tablet Take 1 tablet (40 mg total) by mouth daily. 30 tablet 1   azelastine (ASTELIN) 0.1 %  nasal spray Place 2 sprays into both nostrils 2 (two) times daily. Use in each nostril as directed (Patient taking differently: Place 2 sprays into both nostrils 2 (two) times daily as needed for allergies. Use in each nostril as directed) 30 mL 12   Calcium-Phosphorus-Vitamin D (CITRACAL +D3 PO) Take 1 tablet by mouth daily. Vit d 500 IU, calcium 630mg  each     Docusate Sodium 100 MG capsule Take 200 mg by mouth at bedtime.     ergocalciferol (VITAMIN D2) 1.25 MG (50000 UT) capsule Take 50,000 Units by mouth once a week.     esomeprazole (NEXIUM) 40 MG capsule TAKE 1 CAPSULE(40 MG) BY MOUTH DAILY 30 capsule 5   losartan (COZAAR) 50 MG tablet Take 50 mg by mouth daily.     metoCLOPramide (REGLAN) 5 MG tablet Take 5 mg by mouth at bedtime.     Multiple Vitamins-Minerals (MULTIVITAMIN ADULTS 50+) TABS Take 1 tablet by mouth daily.     Tiotropium Bromide Monohydrate (SPIRIVA RESPIMAT) 2.5 MCG/ACT AERS Inhale into the lungs 2 (two) times daily.     venlafaxine XR (EFFEXOR-XR) 75 MG 24 hr capsule TAKE 1 CAPSULE BY MOUTH DAILY WITH BREAKFAST 90 capsule 0   Vitamin D, Ergocalciferol, (DRISDOL) 1.25 MG (50000 UNIT) CAPS capsule Take 50,000 Units by mouth  once a week. Take on Tuesdays     No current facility-administered medications on file prior to visit.    Allergies:   Allergies  Allergen Reactions   Darvon [Propoxyphene] Other (See Comments)    "Slept for 3 days"   Duloxetine     Added from [facility]- patient is unsure       OBJECTIVE:  Physical Exam  Vitals:   02/14/21 1115  BP: 131/82  Pulse: 89  Weight: 148 lb (67.1 kg)  Height: 5\' 4"  (1.626 m)   Body mass index is 25.4 kg/m. No results found.  Post stroke PHQ 2/9 Depression screen PHQ 2/9 02/14/2021  Decreased Interest 0  Down, Depressed, Hopeless 0  PHQ - 2 Score 0  Altered sleeping -  Tired, decreased energy -  Change in appetite -  Feeling bad or failure about yourself  -  Trouble concentrating -  Moving slowly or fidgety/restless -  Suicidal thoughts -  PHQ-9 Score -     General: well developed, well nourished, very pleasant middle-age Caucasian female, seated, in no evident distress Head: head normocephalic and atraumatic.   Neck: supple with no carotid or supraclavicular bruits Cardiovascular: regular rate and rhythm, no murmurs Musculoskeletal: no deformity Skin:  no rash/petichiae Vascular:  Normal pulses all extremities   Neurologic Exam Mental Status: Awake and fully alert.  Fluent speech and language.  Oriented to place and time. Recent memory subjectively impaired and remote memory intact. Attention span, concentration and fund of knowledge appropriate. Mood and affect appropriate.  Cranial Nerves: Fundoscopic exam reveals sharp disc margins. Pupils equal, briskly reactive to light. Extraocular movements full without nystagmus. Visual fields full to confrontation although subjective left upper blurred vision bilaterally. Hearing intact. Facial sensation intact. Face, tongue, palate moves normally and symmetrically.  Motor: Normal bulk and tone. Normal strength in all tested extremity muscles Sensory.: intact to touch , pinprick ,  position and vibratory sensation.  Coordination: Rapid alternating movements normal in all extremities. Finger-to-nose and heel-to-shin performed accurately bilaterally. Gait and Station: Arises from chair without difficulty. Stance is normal. Gait demonstrates normal stride length and balance without use of assistive device. Tandem walk and heel toe  without difficulty.  Reflexes: 1+ and symmetric. Toes downgoing.     NIHSS  0 Modified Rankin  1      ASSESSMENT: Yolanda Gibbs is a 70 y.o. year old female with recent right occipital stroke embolic secondary to unknown source on 12/31/2020 s/p ILR. Vascular risk factors include HTN, HLD, small PFO, advanced age and ocular migraines.      PLAN:  R occipital stroke, cryptogenic:  Residual deficit: Subjective blurred vision, short-term memory loss and fatigue.  Discussed typical recovery time after a stroke and likely improvement of memory concerns and fatigue -discussed memory exercises.  If symptoms persist at follow-up visit, will further evaluate.  She is scheduled to see her ophthalmologist in August for evaluation Loop recorder has not shown atrial fibrillation thus far -personally reviewed multiple reports by cardiology.   Continue aspirin 81 mg daily  and atorvastatin 40 mg daily for secondary stroke prevention.  Discussed secondary stroke prevention measures and importance of close PCP follow up for aggressive stroke risk factor management  Declines interest in [place] trial HTN: BP goal <130/90.  Stable on current regimen per PCP HLD: LDL goal <70. Recent LDL 89 -initiate atorvastatin 40 mg daily.  Request PCP continue to prescribe routine monitoring management of cholesterol PFO: As evidenced on TEE bubble study - felt to be moderate size after TEE reviewed by Dr. Excell Seltzer.  ROPE score 4 indicating 38% chance of PFO contributing to stroke.  Has evaluation with Dr. Excell Seltzer 7/6 for further evaluation and discussion regarding possible  closure    Follow up in 3 months or call earlier if needed   CC:  [facility] provider: Dr. Pearlean Brownie PCP: Jiles Prows, MD    I spent 46 minutes of face-to-face and non-face-to-face time with patient.  This included previsit chart review including review of recent hospitalization, lab review, study review, electronic health record documentation, and patient education and discussion regarding recent stroke and possible etiologies as well as secondary stroke prevention measures and importance of aggressive stroke risk factor management, residual deficits and typical recovery time, PFO and possible closure and answered all other questions to patient satisfaction   Ihor Austin, AGNP-BC  [facility] [address] [place], [place] [address]  Phone [phone] Fax [phone] Note: This document was prepared with digital dictation and possible smart phrase technology. Any transcriptional errors that result from this process are unintentional.

## 2021-02-14 NOTE — Patient Instructions (Signed)
Fatigue and memory loss after stroke are normal and should continue to improve over the next couple months.  If you continue to have difficulties at our next visit, we can look into this further for possible other causes.  Slowly returning back to prior activities as well as doing memory exercises such as crossword puzzles, word search, reading and card games can be beneficial  Continue aspirin 81 mg daily  and atorvastatin 40 mg daily for secondary stroke prevention  Your loop recorder will continue to be monitored for possible atrial fibrillation  Follow-up with Dr. Excell Seltzer next week to discuss possible PFO closure  Continue to follow up with PCP regarding cholesterol and blood pressure management  Maintain strict control of hypertension with blood pressure goal below 130/90 and cholesterol with LDL cholesterol (bad cholesterol) goal below 70 mg/dL.       Followup in the future with me in 3 months or call earlier if needed       Thank you for coming to see [place] at [facility]. I hope we have been able to provide you high quality care today.  You may receive a patient satisfaction survey over the next few weeks. We would appreciate your feedback and comments so that we may continue to improve ourselves and the health of our patients.

## 2021-02-16 NOTE — Progress Notes (Signed)
I agree with the above plan 

## 2021-02-21 ENCOUNTER — Other Ambulatory Visit: Payer: Self-pay

## 2021-02-21 ENCOUNTER — Ambulatory Visit (INDEPENDENT_AMBULATORY_CARE_PROVIDER_SITE_OTHER): Payer: Medicare Other | Admitting: Cardiovascular Disease

## 2021-02-21 ENCOUNTER — Encounter: Payer: Self-pay | Admitting: Cardiovascular Disease

## 2021-02-21 VITALS — BP 140/84 | HR 84 | Ht 64.0 in | Wt 150.2 lb

## 2021-02-21 DIAGNOSIS — I639 Cerebral infarction, unspecified: Secondary | ICD-10-CM | POA: Diagnosis not present

## 2021-02-21 DIAGNOSIS — Q211 Atrial septal defect: Secondary | ICD-10-CM

## 2021-02-21 DIAGNOSIS — Q2112 Patent foramen ovale: Secondary | ICD-10-CM

## 2021-02-21 NOTE — Patient Instructions (Signed)
Medication Instructions:  No changes  Lab Work: none  Testing/Procedures: none   Follow-Up: As needed.  Other Instructions

## 2021-02-21 NOTE — Progress Notes (Signed)
Cardiology Office Note:    Date:  02/21/2021   ID:  Yolanda Gibbs, DOB 1951/04/25, MRN [id number]  PCP:  Jiles Prows, MD   [facility] Providers Cardiologist:  None     Referring MD: No ref. provider found   Chief Complaint  Patient presents with   PFO    History of Present Illness:    Yolanda Gibbs is a 70 y.o. female presenting for evaluation of PFO, referred by Dr Roda Shutters. She is here with her husband today. She woke up on Mother's Day with headache and vision changes. She has a hx of migraines but she did not develop a headache with this. Symptoms resolved temporarily but she then experienced blurry vision. She went for evaluation and was noted to have 'very high blood pressure.' She was started on medication for BP and 1 week later she ultimately went to the ER for evaluation of headache and blurry vision.  She was found to have an acute infarct in the mid right occipital lobe with no associated mass or hemorrhage.  This finding was confirmed on brain MRI.other than her recent diagnosis of hypertension, she had no risk factors for stroke.  She has no history of hyperlipidemia, atherosclerotic disease, tobacco use, or diabetes.  She has continued to have trouble with her vision, but otherwise no change in symptoms and no other associated symptoms.  The patient underwent extensive hospital evaluation.  CT and MRI studies of the brain again demonstrated a right occipital stroke.  Labs showed a normal CBC and metabolic panel.  LDL cholesterol was 89.  2D echocardiogram showed normal LV function, normal RV function, mild aortic valve sclerosis, and no other significant abnormalities.  TEE confirmed preserved LV systolic function with LVEF 60 to 65%, normal RV function, no evidence of thrombus in the left atrial appendage, no significant valvular disease, and the presence of a PFO is confirmed with a positive agitated saline study demonstrating bubbles in the left atrium within 3-6 cardiac  cycles.  She is now referred for consideration of transcatheter PFO closure.  The patient has had no cardiac symptoms and specifically denies chest pain, shortness of breath, or heart palpitations.  She did undergo an implantable loop recorder which is demonstrated no significant arrhythmia today.  Past Medical History:  Diagnosis Date   Allergy    seasonal   Depression    GERD (gastroesophageal reflux disease)    History of chicken pox    History of colon polyps    benign   Hypertension     Past Surgical History:  Procedure Laterality Date   BUBBLE STUDY  01/02/2021   Procedure: BUBBLE STUDY;  Surgeon: [name], MD;  Location: [facility] ENDOSCOPY;  Service: Cardiovascular;;   LOOP RECORDER INSERTION N/A 01/02/2021   Procedure: LOOP RECORDER INSERTION;  Surgeon: Thurmon Fair, MD;  Location: [facility] INVASIVE CV LAB;  Service: Cardiovascular;  Laterality: N/A;   TEE WITHOUT CARDIOVERSION N/A 01/02/2021   Procedure: TRANSESOPHAGEAL ECHOCARDIOGRAM (TEE);  Surgeon: [name], MD;  Location: [facility] ENDOSCOPY;  Service: Cardiovascular;  Laterality: N/A;    Current Medications: Current Meds  Medication Sig   aspirin EC 81 MG EC tablet Take 1 tablet (81 mg total) by mouth daily. Swallow whole.   atorvastatin (LIPITOR) 40 MG tablet Take 1 tablet (40 mg total) by mouth daily.   azelastine (ASTELIN) 0.1 % nasal spray Place 2 sprays into both nostrils 2 (two) times daily. Use in each nostril as directed   Calcium-Phosphorus-Vitamin D (CITRACAL +  D3 PO) Take 1 tablet by mouth daily. Vit d 500 IU, calcium 630mg  each   Docusate Sodium 100 MG capsule Take 200 mg by mouth at bedtime.   ergocalciferol (VITAMIN D2) 1.25 MG (50000 UT) capsule Take 50,000 Units by mouth once a week.   esomeprazole (NEXIUM) 40 MG capsule TAKE 1 CAPSULE(40 MG) BY MOUTH DAILY   losartan (COZAAR) 50 MG tablet Take 50 mg by mouth daily.   metoCLOPramide (REGLAN) 5 MG tablet Take 5 mg by mouth at bedtime.    Multiple Vitamins-Minerals (MULTIVITAMIN ADULTS 50+) TABS Take 1 tablet by mouth daily.   tiotropium (SPIRIVA) 18 MCG inhalation capsule Place 18 mcg into inhaler and inhale daily.   Tiotropium Bromide Monohydrate (SPIRIVA RESPIMAT) 2.5 MCG/ACT AERS Inhale into the lungs 2 (two) times daily.   venlafaxine XR (EFFEXOR-XR) 75 MG 24 hr capsule TAKE 1 CAPSULE BY MOUTH DAILY WITH BREAKFAST   Vitamin D, Ergocalciferol, (DRISDOL) 1.25 MG (50000 UNIT) CAPS capsule Take 50,000 Units by mouth once a week. Take on Tuesdays     Allergies:   Darvon [propoxyphene] and Duloxetine   Social History   Socioeconomic History   Marital status: Married    Spouse name: Not on file   Number of children: Not on file   Years of education: Not on file   Highest education level: Not on file  Occupational History   Not on file  Tobacco Use   Smoking status: Never   Smokeless tobacco: Never  Vaping Use   Vaping Use: Never used  Substance and Sexual Activity   Alcohol use: Yes    Alcohol/week: 3.0 - 4.0 standard drinks    Types: 3 - 4 Standard drinks or equivalent per week    Comment: occasional   Drug use: No   Sexual activity: Not on file  Other Topics Concern   Not on file  Social History Narrative   Daughter at 02-24-1973 (Haroldine Laws) born 63   Daughter Erin 63   Husband is a 82, has a Curator, has a Forensic scientist helps with day to day of the business   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   Completed college   Enjoys watching old movies, needlework, cleaning her house   No pets.    Social Determinants of Health   Financial Resource Strain: Not on file  Food Insecurity: Not on file  Transportation Needs: Not on file  Physical Activity: Not on file  Stress: Not on file  Social Connections: Not on file     Family History: The patient's family history includes AAA (abdominal aortic aneurysm) in her mother; Arthritis in her maternal grandmother and  mother; CAD in her mother; Cancer in her maternal aunt, maternal grandfather, and mother; Depression in her paternal grandmother; Stroke in her father.  ROS:   Please see the history of present illness.     All other systems reviewed and are negative.  EKGs/Labs/Other Studies Reviewed:    The following studies were reviewed today: Transesophageal echo: IMPRESSIONS     1. Left ventricular ejection fraction, by estimation, is 60 to 65%. The  left ventricle has normal function. The left ventricle has no regional  wall motion abnormalities.   2. Right ventricular systolic function is normal. The right ventricular  size is mildly enlarged.   3. No left atrial/left atrial appendage thrombus was detected.   4. The mitral valve is normal in structure. Trivial mitral valve  regurgitation.   5.  The aortic valve is tricuspid. Aortic valve regurgitation is not  visualized. No aortic stenosis is present.   6. Agitated saline contrast bubble study was positive with shunting  observed within 3-6 cardiac cycles suggestive of interatrial shunt.  Consistent with PFO.   EKG:  EKG is ordered today.  The ekg ordered today demonstrates normal sinus rhythm 84 bpm, within normal limits.  Recent Labs: 12/31/2020: BUN 16; Creatinine, Ser 0.56; Hemoglobin 14.0; Platelets 322; Potassium 3.8; Sodium 138  Recent Lipid Panel    Component Value Date/Time   CHOL 190 01/01/2021 0015   TRIG 84 01/01/2021 0015   HDL 84 01/01/2021 0015   CHOLHDL 2.3 01/01/2021 0015   VLDL 17 01/01/2021 0015   LDLCALC 89 01/01/2021 0015     Risk Assessment/Calculations:           Physical Exam:    VS:  BP 140/84   Pulse 84   Ht 5\' 4"  (1.626 m)   Wt 150 lb 3.2 oz (68.1 kg)   LMP 08/19/2004   SpO2 97%   BMI 25.78 kg/m     Wt Readings from Last 3 Encounters:  02/21/21 150 lb 3.2 oz (68.1 kg)  02/14/21 148 lb (67.1 kg)  01/03/21 152 lb (68.9 kg)     GEN:  Well nourished, well developed in no acute  distress HEENT: Normal NECK: No JVD; No carotid bruits LYMPHATICS: No lymphadenopathy CARDIAC: RRR, no murmurs, rubs, gallops RESPIRATORY:  Clear to auscultation without rales, wheezing or rhonchi  ABDOMEN: Soft, non-tender, non-distended MUSCULOSKELETAL:  No edema; No deformity  SKIN: Warm and dry NEUROLOGIC:  Alert and oriented x 3 PSYCHIATRIC:  Normal affect   ASSESSMENT:    1. Cryptogenic stroke (HCC)   2. PFO (patent foramen ovale)    PLAN:    In order of problems listed above:  70 year old woman, previously healthy other than recent diagnosis of hypertension, who presents with cryptogenic stroke affecting the right occipital area with persistent vision problems.  Thorough evaluation demonstrates no clear etiology of her stroke, other than the presence of a PFO.  I have personally reviewed her transesophageal echo study which shows no significant valvular problems, normal LV and RV function, and the presence of a relatively small PFO.  There is a positive bubble study present.  There are a few frames of color flow across the PFO, but overall I think the PFO is anatomically low risk as it is not large by 2D imaging, nor is it large by the number of bubbles in the left atrium.  There is no associated atrial septal aneurysm.  To date, the patient has had no evidence of atrial fibrillation on her Linq monitor.  She is on guideline directed medical therapy with aspirin, high intensity statin drug, and an ARB to control blood pressure.  I reviewed available evidence with respect to transcatheter PFO closure.  She understands that the randomized controlled trials in patients with cryptogenic stroke have been performed on patients between ages 1 and 107.  She understands that her clinical scenario is somewhat of a gray area.  While she is 70 years old, she has been healthy and has no typical stroke risk factors other than recent hypertension which may have started after her stroke.  However, using  anatomic criteria her PFO is not "high risk."  I would probably favor ongoing medical therapy, but she understands I would be open to offering her transcatheter PFO closure if she strongly would like to proceed.  I demonstrated  the Amplatzer PFO occluder device and went through all the procedural steps.  I discussed potential risks, both short and long-term, that can be associated with PFO closure.  The patient is leaning towards having the procedure done, but would like to think about it and discuss things with her other family members.  She will be back in touch if she decides to proceed.  Otherwise, I would be happy to see her as needed in the future.  I have recommended that she continue with her current medical program.        Medication Adjustments/Labs and Tests Ordered: Current medicines are reviewed at length with the patient today.  Concerns regarding medicines are outlined above.  Orders Placed This Encounter  Procedures   EKG 12-Lead   No orders of the defined types were placed in this encounter.   Patient Instructions  Medication Instructions:  No changes  Lab Work: none  Testing/Procedures: none   Follow-Up: As needed.  Other Instructions     Signed, Tonny BollmanMichael Boomer Winders, MD  02/21/2021 10:01 AM    [facility]

## 2021-02-21 NOTE — H&P (View-Only) (Signed)
Cardiology Office Note:    Date:  02/21/2021   ID:  Yolanda Gibbs, DOB 1951/04/25, MRN [id number]  PCP:  Jiles Prows, MD   [facility] Providers Cardiologist:  None     Referring MD: No ref. provider found   Chief Complaint  Patient presents with   PFO    History of Present Illness:    Yolanda Gibbs is a 70 y.o. female presenting for evaluation of PFO, referred by Dr Roda Shutters. She is here with her husband today. She woke up on Mother's Day with headache and vision changes. She has a hx of migraines but she did not develop a headache with this. Symptoms resolved temporarily but she then experienced blurry vision. She went for evaluation and was noted to have 'very high blood pressure.' She was started on medication for BP and 1 week later she ultimately went to the ER for evaluation of headache and blurry vision.  She was found to have an acute infarct in the mid right occipital lobe with no associated mass or hemorrhage.  This finding was confirmed on brain MRI.other than her recent diagnosis of hypertension, she had no risk factors for stroke.  She has no history of hyperlipidemia, atherosclerotic disease, tobacco use, or diabetes.  She has continued to have trouble with her vision, but otherwise no change in symptoms and no other associated symptoms.  The patient underwent extensive hospital evaluation.  CT and MRI studies of the brain again demonstrated a right occipital stroke.  Labs showed a normal CBC and metabolic panel.  LDL cholesterol was 89.  2D echocardiogram showed normal LV function, normal RV function, mild aortic valve sclerosis, and no other significant abnormalities.  TEE confirmed preserved LV systolic function with LVEF 60 to 65%, normal RV function, no evidence of thrombus in the left atrial appendage, no significant valvular disease, and the presence of a PFO is confirmed with a positive agitated saline study demonstrating bubbles in the left atrium within 3-6 cardiac  cycles.  She is now referred for consideration of transcatheter PFO closure.  The patient has had no cardiac symptoms and specifically denies chest pain, shortness of breath, or heart palpitations.  She did undergo an implantable loop recorder which is demonstrated no significant arrhythmia today.  Past Medical History:  Diagnosis Date   Allergy    seasonal   Depression    GERD (gastroesophageal reflux disease)    History of chicken pox    History of colon polyps    benign   Hypertension     Past Surgical History:  Procedure Laterality Date   BUBBLE STUDY  01/02/2021   Procedure: BUBBLE STUDY;  Surgeon: [name], MD;  Location: [facility] ENDOSCOPY;  Service: Cardiovascular;;   LOOP RECORDER INSERTION N/A 01/02/2021   Procedure: LOOP RECORDER INSERTION;  Surgeon: Thurmon Fair, MD;  Location: [facility] INVASIVE CV LAB;  Service: Cardiovascular;  Laterality: N/A;   TEE WITHOUT CARDIOVERSION N/A 01/02/2021   Procedure: TRANSESOPHAGEAL ECHOCARDIOGRAM (TEE);  Surgeon: [name], MD;  Location: [facility] ENDOSCOPY;  Service: Cardiovascular;  Laterality: N/A;    Current Medications: Current Meds  Medication Sig   aspirin EC 81 MG EC tablet Take 1 tablet (81 mg total) by mouth daily. Swallow whole.   atorvastatin (LIPITOR) 40 MG tablet Take 1 tablet (40 mg total) by mouth daily.   azelastine (ASTELIN) 0.1 % nasal spray Place 2 sprays into both nostrils 2 (two) times daily. Use in each nostril as directed   Calcium-Phosphorus-Vitamin D (CITRACAL +  D3 PO) Take 1 tablet by mouth daily. Vit d 500 IU, calcium 630mg  each   Docusate Sodium 100 MG capsule Take 200 mg by mouth at bedtime.   ergocalciferol (VITAMIN D2) 1.25 MG (50000 UT) capsule Take 50,000 Units by mouth once a week.   esomeprazole (NEXIUM) 40 MG capsule TAKE 1 CAPSULE(40 MG) BY MOUTH DAILY   losartan (COZAAR) 50 MG tablet Take 50 mg by mouth daily.   metoCLOPramide (REGLAN) 5 MG tablet Take 5 mg by mouth at bedtime.    Multiple Vitamins-Minerals (MULTIVITAMIN ADULTS 50+) TABS Take 1 tablet by mouth daily.   tiotropium (SPIRIVA) 18 MCG inhalation capsule Place 18 mcg into inhaler and inhale daily.   Tiotropium Bromide Monohydrate (SPIRIVA RESPIMAT) 2.5 MCG/ACT AERS Inhale into the lungs 2 (two) times daily.   venlafaxine XR (EFFEXOR-XR) 75 MG 24 hr capsule TAKE 1 CAPSULE BY MOUTH DAILY WITH BREAKFAST   Vitamin D, Ergocalciferol, (DRISDOL) 1.25 MG (50000 UNIT) CAPS capsule Take 50,000 Units by mouth once a week. Take on Tuesdays     Allergies:   Darvon [propoxyphene] and Duloxetine   Social History   Socioeconomic History   Marital status: Married    Spouse name: Not on file   Number of children: Not on file   Years of education: Not on file   Highest education level: Not on file  Occupational History   Not on file  Tobacco Use   Smoking status: Never   Smokeless tobacco: Never  Vaping Use   Vaping Use: Never used  Substance and Sexual Activity   Alcohol use: Yes    Alcohol/week: 3.0 - 4.0 standard drinks    Types: 3 - 4 Standard drinks or equivalent per week    Comment: occasional   Drug use: No   Sexual activity: Not on file  Other Topics Concern   Not on file  Social History Narrative   Daughter at 02-24-1973 (Haroldine Laws) born 63   Daughter Erin 63   Husband is a 80, has a Curator, has a Forensic scientist helps with day to day of the business   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   Completed college   Enjoys watching old movies, needlework, cleaning her house   No pets.    Social Determinants of Health   Financial Resource Strain: Not on file  Food Insecurity: Not on file  Transportation Needs: Not on file  Physical Activity: Not on file  Stress: Not on file  Social Connections: Not on file     Family History: The patient's family history includes AAA (abdominal aortic aneurysm) in her mother; Arthritis in her maternal grandmother and  mother; CAD in her mother; Cancer in her maternal aunt, maternal grandfather, and mother; Depression in her paternal grandmother; Stroke in her father.  ROS:   Please see the history of present illness.     All other systems reviewed and are negative.  EKGs/Labs/Other Studies Reviewed:    The following studies were reviewed today: Transesophageal echo: IMPRESSIONS     1. Left ventricular ejection fraction, by estimation, is 60 to 65%. The  left ventricle has normal function. The left ventricle has no regional  wall motion abnormalities.   2. Right ventricular systolic function is normal. The right ventricular  size is mildly enlarged.   3. No left atrial/left atrial appendage thrombus was detected.   4. The mitral valve is normal in structure. Trivial mitral valve  regurgitation.   5.  The aortic valve is tricuspid. Aortic valve regurgitation is not  visualized. No aortic stenosis is present.   6. Agitated saline contrast bubble study was positive with shunting  observed within 3-6 cardiac cycles suggestive of interatrial shunt.  Consistent with PFO.   EKG:  EKG is ordered today.  The ekg ordered today demonstrates normal sinus rhythm 84 bpm, within normal limits.  Recent Labs: 12/31/2020: BUN 16; Creatinine, Ser 0.56; Hemoglobin 14.0; Platelets 322; Potassium 3.8; Sodium 138  Recent Lipid Panel    Component Value Date/Time   CHOL 190 01/01/2021 0015   TRIG 84 01/01/2021 0015   HDL 84 01/01/2021 0015   CHOLHDL 2.3 01/01/2021 0015   VLDL 17 01/01/2021 0015   LDLCALC 89 01/01/2021 0015     Risk Assessment/Calculations:           Physical Exam:    VS:  BP 140/84   Pulse 84   Ht 5\' 4"  (1.626 m)   Wt 150 lb 3.2 oz (68.1 kg)   LMP 08/19/2004   SpO2 97%   BMI 25.78 kg/m     Wt Readings from Last 3 Encounters:  02/21/21 150 lb 3.2 oz (68.1 kg)  02/14/21 148 lb (67.1 kg)  01/03/21 152 lb (68.9 kg)     GEN:  Well nourished, well developed in no acute  distress HEENT: Normal NECK: No JVD; No carotid bruits LYMPHATICS: No lymphadenopathy CARDIAC: RRR, no murmurs, rubs, gallops RESPIRATORY:  Clear to auscultation without rales, wheezing or rhonchi  ABDOMEN: Soft, non-tender, non-distended MUSCULOSKELETAL:  No edema; No deformity  SKIN: Warm and dry NEUROLOGIC:  Alert and oriented x 3 PSYCHIATRIC:  Normal affect   ASSESSMENT:    1. Cryptogenic stroke (HCC)   2. PFO (patent foramen ovale)    PLAN:    In order of problems listed above:  70 year old woman, previously healthy other than recent diagnosis of hypertension, who presents with cryptogenic stroke affecting the right occipital area with persistent vision problems.  Thorough evaluation demonstrates no clear etiology of her stroke, other than the presence of a PFO.  I have personally reviewed her transesophageal echo study which shows no significant valvular problems, normal LV and RV function, and the presence of a relatively small PFO.  There is a positive bubble study present.  There are a few frames of color flow across the PFO, but overall I think the PFO is anatomically low risk as it is not large by 2D imaging, nor is it large by the number of bubbles in the left atrium.  There is no associated atrial septal aneurysm.  To date, the patient has had no evidence of atrial fibrillation on her Linq monitor.  She is on guideline directed medical therapy with aspirin, high intensity statin drug, and an ARB to control blood pressure.  I reviewed available evidence with respect to transcatheter PFO closure.  She understands that the randomized controlled trials in patients with cryptogenic stroke have been performed on patients between ages 1 and 107.  She understands that her clinical scenario is somewhat of a gray area.  While she is 70 years old, she has been healthy and has no typical stroke risk factors other than recent hypertension which may have started after her stroke.  However, using  anatomic criteria her PFO is not "high risk."  I would probably favor ongoing medical therapy, but she understands I would be open to offering her transcatheter PFO closure if she strongly would like to proceed.  I demonstrated  the Amplatzer PFO occluder device and went through all the procedural steps.  I discussed potential risks, both short and long-term, that can be associated with PFO closure.  The patient is leaning towards having the procedure done, but would like to think about it and discuss things with her other family members.  She will be back in touch if she decides to proceed.  Otherwise, I would be happy to see her as needed in the future.  I have recommended that she continue with her current medical program.        Medication Adjustments/Labs and Tests Ordered: Current medicines are reviewed at length with the patient today.  Concerns regarding medicines are outlined above.  Orders Placed This Encounter  Procedures   EKG 12-Lead   No orders of the defined types were placed in this encounter.   Patient Instructions  Medication Instructions:  No changes  Lab Work: none  Testing/Procedures: none   Follow-Up: As needed.  Other Instructions     Signed, Oziah Vitanza, MD  02/21/2021 10:01 AM    [facility]  

## 2021-02-22 DIAGNOSIS — Q2112 Patent foramen ovale: Secondary | ICD-10-CM

## 2021-02-22 DIAGNOSIS — Q211 Atrial septal defect: Secondary | ICD-10-CM

## 2021-02-23 NOTE — Progress Notes (Signed)
Carelink Summary Report / Loop Recorder 

## 2021-02-26 MED ORDER — PANTOPRAZOLE SODIUM 40 MG PO TBEC: 40.0000 mg | DELAYED_RELEASE_TABLET | Freq: Every day | ORAL | 1 refills | Status: DC

## 2021-02-26 MED ORDER — CLOPIDOGREL BISULFATE 75 MG PO TABS: 75.0000 mg | ORAL_TABLET | Freq: Every day | ORAL | 1 refills | Status: DC

## 2021-02-26 NOTE — Telephone Encounter (Signed)
Discussed with Dr. Excell Seltzer. The patient will start Plavix 75 mg daily 5 days prior to closure scheduled 03/09/21. She will stop Nexium and start Protonix 40 mg daily. Pre-procedural labs scheduled next week. See MyChart messaging for further procedural instructions.

## 2021-02-27 NOTE — Progress Notes (Signed)
I agree with the above plan 

## 2021-02-27 NOTE — Telephone Encounter (Signed)
The patient called and confirmed closure for 7/22.  Instructions reviewed and all questions answered.  She was grateful for call and agrees with plan.

## 2021-03-05 ENCOUNTER — Other Ambulatory Visit: Payer: Medicare Other | Admitting: *Deleted

## 2021-03-05 ENCOUNTER — Other Ambulatory Visit: Payer: Self-pay

## 2021-03-05 DIAGNOSIS — Q2112 Patent foramen ovale: Secondary | ICD-10-CM

## 2021-03-05 DIAGNOSIS — Q211 Atrial septal defect: Secondary | ICD-10-CM

## 2021-03-05 LAB — BASIC METABOLIC PANEL
BUN/Creatinine Ratio: 38 — ABNORMAL HIGH (ref 12–28)
BUN: 23 mg/dL (ref 8–27)
CO2: 31 mmol/L — ABNORMAL HIGH (ref 20–29)
Calcium: 10.3 mg/dL (ref 8.7–10.3)
Chloride: 102 mmol/L (ref 96–106)
Creatinine, Ser: 0.61 mg/dL (ref 0.57–1.00)
Glucose: 109 mg/dL — ABNORMAL HIGH (ref 65–99)
Potassium: 4.1 mmol/L (ref 3.5–5.2)
Sodium: 139 mmol/L (ref 134–144)
eGFR: 97 mL/min/{1.73_m2} (ref >59)

## 2021-03-05 LAB — CBC WITH DIFFERENTIAL/PLATELET
Basophils Absolute: 0.1 10*3/uL (ref 0.0–0.2)
Basos: 1 %
EOS (ABSOLUTE): 0.3 10*3/uL (ref 0.0–0.4)
Eos: 5 %
Hematocrit: 38.9 % (ref 34.0–46.6)
Hemoglobin: 12.8 g/dL (ref 11.1–15.9)
Lymphocytes Absolute: 2.3 10*3/uL (ref 0.7–3.1)
Lymphs: 33 %
MCH: 29.8 pg (ref 26.6–33.0)
MCHC: 32.9 g/dL (ref 31.5–35.7)
MCV: 91 fL (ref 79–97)
Monocytes Absolute: 0.6 10*3/uL (ref 0.1–0.9)
Monocytes: 9 %
Neutrophils Absolute: 3.8 10*3/uL (ref 1.4–7.0)
Neutrophils: 52 %
Platelets: 284 10*3/uL (ref 150–450)
RBC: 4.3 x10E6/uL (ref 3.77–5.28)
RDW: 13.7 % (ref 11.7–15.4)
WBC: 7.1 10*3/uL (ref 3.4–10.8)

## 2021-03-08 ENCOUNTER — Telehealth: Payer: Self-pay | Admitting: *Deleted

## 2021-03-08 NOTE — Telephone Encounter (Signed)
Pt contacted pre-PFO closure scheduled at [facility] for: Friday March 09, 2021 11:30 AM Verified arrival time and place: [facility] Main Entrance [facility]) at: 9:30 AM   No solid food after midnight prior to cath, clear liquids until 5 AM day of procedure.   AM meds can be  taken pre-cath with sips of water including: aspirin 81 mg Plavix 75 mg  Confirmed patient has responsible adult to drive home post procedure and be with patient first 24 hours after arriving home: yes  You are allowed ONE visitor in the waiting room during the time you are at the hospital for your procedure. Both you and your visitor must wear a mask once you enter the hospital.   Patient reports does not currently have any symptoms concerning for COVID-19 and no household members with COVID-19 like illness.        Reviewed procedure/mask/visitor instructions with patient.

## 2021-03-09 ENCOUNTER — Ambulatory Visit (HOSPITAL_BASED_OUTPATIENT_CLINIC_OR_DEPARTMENT_OTHER): Payer: Medicare Other

## 2021-03-09 ENCOUNTER — Ambulatory Visit (HOSPITAL_COMMUNITY)
Admission: RE | Admit: 2021-03-09 | Discharge: 2021-03-09 | Disposition: A | Discharge status: Home / Self Care | Payer: Medicare Other | Attending: Cardiovascular Disease | Admitting: Cardiovascular Disease

## 2021-03-09 ENCOUNTER — Ambulatory Visit (HOSPITAL_COMMUNITY)
Admission: RE | Disposition: A | Discharge status: Home / Self Care | Payer: Self-pay | Source: Home / Self Care | Attending: Cardiovascular Disease

## 2021-03-09 ENCOUNTER — Other Ambulatory Visit: Payer: Self-pay

## 2021-03-09 DIAGNOSIS — Z7982 Long term (current) use of aspirin: Secondary | ICD-10-CM | POA: Diagnosis not present

## 2021-03-09 DIAGNOSIS — Z79899 Other long term (current) drug therapy: Secondary | ICD-10-CM | POA: Insufficient documentation

## 2021-03-09 DIAGNOSIS — Z8249 Family history of ischemic heart disease and other diseases of the circulatory system: Secondary | ICD-10-CM | POA: Diagnosis not present

## 2021-03-09 DIAGNOSIS — I10 Essential (primary) hypertension: Secondary | ICD-10-CM | POA: Insufficient documentation

## 2021-03-09 DIAGNOSIS — I639 Cerebral infarction, unspecified: Secondary | ICD-10-CM | POA: Insufficient documentation

## 2021-03-09 DIAGNOSIS — Z888 Allergy status to other drugs, medicaments and biological substances status: Secondary | ICD-10-CM | POA: Diagnosis not present

## 2021-03-09 DIAGNOSIS — Q211 Atrial septal defect: Secondary | ICD-10-CM | POA: Diagnosis present

## 2021-03-09 DIAGNOSIS — Q2112 Patent foramen ovale: Secondary | ICD-10-CM

## 2021-03-09 HISTORY — PX: PATENT FORAMEN OVALE(PFO) CLOSURE: CATH118300

## 2021-03-09 LAB — ECHOCARDIOGRAM LIMITED
Height: 64 in
S' Lateral: 2.3 cm
Weight: 2368 oz

## 2021-03-09 LAB — POCT ACTIVATED CLOTTING TIME
Activated Clotting Time: 231 seconds
Activated Clotting Time: 271 seconds

## 2021-03-09 SURGERY — PATENT FORAMEN OVALE (PFO) CLOSURE
Anesthesia: LOCAL

## 2021-03-09 MED ORDER — SODIUM CHLORIDE 0.9 % IV SOLN: 250.0000 mL | INTRAVENOUS | Status: DC | PRN

## 2021-03-09 MED ORDER — LIDOCAINE HCL (PF) 1 % IJ SOLN
INTRAMUSCULAR | Status: AC
  Filled 2021-03-09: qty 30

## 2021-03-09 MED ORDER — SODIUM CHLORIDE 0.9% FLUSH: 3.0000 mL | INTRAVENOUS | Status: DC | PRN

## 2021-03-09 MED ORDER — DIAZEPAM 5 MG PO TABS: 5.0000 mg | ORAL_TABLET | ORAL | Status: DC | PRN

## 2021-03-09 MED ORDER — ONDANSETRON HCL 4 MG/2ML IJ SOLN: 4.0000 mg | Freq: Four times a day (QID) | INTRAMUSCULAR | Status: DC | PRN

## 2021-03-09 MED ORDER — HEPARIN (PORCINE) IN NACL 1000-0.9 UT/500ML-% IV SOLN
INTRAVENOUS | Status: AC
  Filled 2021-03-09: qty 1000

## 2021-03-09 MED ORDER — LIDOCAINE HCL (PF) 1 % IJ SOLN
INTRAMUSCULAR | Status: DC | PRN
  Administered 2021-03-09: 15 mL

## 2021-03-09 MED ORDER — CEFAZOLIN SODIUM-DEXTROSE 2-4 GM/100ML-% IV SOLN
2.0000 g | INTRAVENOUS | Status: AC
  Administered 2021-03-09: 2 g via INTRAVENOUS
  Filled 2021-03-09: qty 100

## 2021-03-09 MED ORDER — SODIUM CHLORIDE 0.9% FLUSH: 3.0000 mL | Freq: Two times a day (BID) | INTRAVENOUS | Status: DC

## 2021-03-09 MED ORDER — LABETALOL HCL 5 MG/ML IV SOLN: 10.0000 mg | INTRAVENOUS | Status: DC | PRN

## 2021-03-09 MED ORDER — FENTANYL CITRATE (PF) 100 MCG/2ML IJ SOLN
INTRAMUSCULAR | Status: DC | PRN
  Administered 2021-03-09: 25 ug via INTRAVENOUS

## 2021-03-09 MED ORDER — SODIUM CHLORIDE 0.9 % WEIGHT BASED INFUSION: 1.0000 mL/kg/h | INTRAVENOUS | Status: DC

## 2021-03-09 MED ORDER — HYDRALAZINE HCL 20 MG/ML IJ SOLN: 10.0000 mg | INTRAMUSCULAR | Status: DC | PRN

## 2021-03-09 MED ORDER — LIDOCAINE-EPINEPHRINE 1 %-1:100000 IJ SOLN
INTRAMUSCULAR | Status: AC
  Filled 2021-03-09: qty 1

## 2021-03-09 MED ORDER — HEPARIN SODIUM (PORCINE) 1000 UNIT/ML IJ SOLN
INTRAMUSCULAR | Status: DC | PRN
  Administered 2021-03-09: 5000 [IU] via INTRAVENOUS
  Administered 2021-03-09: 2000 [IU] via INTRAVENOUS

## 2021-03-09 MED ORDER — HEPARIN (PORCINE) IN NACL 1000-0.9 UT/500ML-% IV SOLN
INTRAVENOUS | Status: DC | PRN
  Administered 2021-03-09 (×2): 500 mL

## 2021-03-09 MED ORDER — ASPIRIN 81 MG PO CHEW: 81.0000 mg | CHEWABLE_TABLET | ORAL | Status: DC

## 2021-03-09 MED ORDER — HEPARIN SODIUM (PORCINE) 1000 UNIT/ML IJ SOLN
INTRAMUSCULAR | Status: AC
  Filled 2021-03-09: qty 1

## 2021-03-09 MED ORDER — CLOPIDOGREL BISULFATE 75 MG PO TABS: 75.0000 mg | ORAL_TABLET | ORAL | Status: DC

## 2021-03-09 MED ORDER — MIDAZOLAM HCL 2 MG/2ML IJ SOLN
INTRAMUSCULAR | Status: DC | PRN
  Administered 2021-03-09: 2 mg via INTRAVENOUS

## 2021-03-09 MED ORDER — SODIUM CHLORIDE 0.9 % WEIGHT BASED INFUSION
3.0000 mL/kg/h | INTRAVENOUS | Status: AC
  Administered 2021-03-09: 3 mL/kg/h via INTRAVENOUS

## 2021-03-09 MED ORDER — MIDAZOLAM HCL 2 MG/2ML IJ SOLN
INTRAMUSCULAR | Status: AC
  Filled 2021-03-09: qty 2

## 2021-03-09 MED ORDER — FENTANYL CITRATE (PF) 100 MCG/2ML IJ SOLN
INTRAMUSCULAR | Status: AC
  Filled 2021-03-09: qty 2

## 2021-03-09 MED ORDER — ACETAMINOPHEN 325 MG PO TABS: 650.0000 mg | ORAL_TABLET | ORAL | Status: DC | PRN

## 2021-03-09 SURGICAL SUPPLY — 18 items
BALLN SIZING AMPLATZER 24 (BALLOONS) ×2
BALLOON SIZING AMPLATZER 24 (BALLOONS) ×1 IMPLANT
CATH ACUNAV 8FR 90CM (CATHETERS) ×2 IMPLANT
CATH INFINITI 5FR AL1 (CATHETERS) IMPLANT
CATH SUPER TORQUE PLUS 6F MPA1 (CATHETERS) ×2 IMPLANT
CLOSURE PERCLOSE PROSTYLE (VASCULAR PRODUCTS) ×4 IMPLANT
COVER SWIFTLINK CONNECTOR (BAG) ×2 IMPLANT
GUIDEWIRE AMPLATZER 1.5JX260 (WIRE) ×2 IMPLANT
OCCLUDER AMPLATZER SEPTAL 16MM (Prosthesis & Implant Heart) ×2 IMPLANT
PACK CARDIAC CATHETERIZATION (CUSTOM PROCEDURE TRAY) ×2 IMPLANT
PROTECTION STATION PRESSURIZED (MISCELLANEOUS) ×2
SHEATH INTROD W/O MIN 9FR 25CM (SHEATH) ×2 IMPLANT
SHEATH PINNACLE 8F 10CM (SHEATH) ×2 IMPLANT
SHEATH PROBE COVER 6X72 (BAG) ×2 IMPLANT
STATION PROTECTION PRESSURIZED (MISCELLANEOUS) ×1 IMPLANT
SYS DELIVER AMP TREVISIO 9FR (SHEATH) ×2
SYSTEM DELIVER AMP TREVIS 9FR (SHEATH) ×1 IMPLANT
WIRE EMERALD 3MM-J .035X150CM (WIRE) ×2 IMPLANT

## 2021-03-09 NOTE — Progress Notes (Signed)
Excell Seltzer, MD at bedside to assess R groin.

## 2021-03-09 NOTE — Discharge Instructions (Signed)

## 2021-03-09 NOTE — Progress Notes (Signed)
Pt with oozing to R groin. Started holding pressure at 1455, held for 20 min, still bleeding. Held til 1530. Level 0. MD paged to inform.

## 2021-03-09 NOTE — Interval H&P Note (Signed)
History and Physical Interval Note:  03/09/2021 12:05 PM  Yolanda Gibbs  has presented today for surgery, with the diagnosis of pfo.  The various methods of treatment have been discussed with the patient and family. After consideration of risks, benefits and other options for treatment, the patient has consented to  Procedure(s): PATENT FORAMEN OVALE (PFO) CLOSURE (N/A) as a surgical intervention.  The patient's history has been reviewed, patient examined, no change in status, stable for surgery.  I have reviewed the patient's chart and labs.  Questions were answered to the patient's satisfaction.    The patient has decided to move forward with PFO closure after a shared decision making discussion. Otherwise see above. She is evaluated in the Short Stay area before the procedure and all of her questions are answered.   Tonny Bollman

## 2021-03-09 NOTE — Progress Notes (Signed)
Cooper MD to bedside to assess groin. States he will return and assess groin again after next case. Pt agrees.  Hand off report given to [place], Charity fundraiser.

## 2021-03-09 NOTE — Progress Notes (Signed)
  Echocardiogram 2D Echocardiogram has been performed.  Stark Bray Swaim 03/09/2021, 3:12 PM

## 2021-03-12 ENCOUNTER — Encounter (HOSPITAL_COMMUNITY): Payer: Self-pay | Admitting: Cardiovascular Disease

## 2021-03-12 ENCOUNTER — Ambulatory Visit (INDEPENDENT_AMBULATORY_CARE_PROVIDER_SITE_OTHER): Payer: Medicare Other

## 2021-03-12 DIAGNOSIS — I639 Cerebral infarction, unspecified: Secondary | ICD-10-CM

## 2021-03-12 MED FILL — Lidocaine Inj 1% w/ Epinephrine-1:100000: INTRAMUSCULAR | Qty: 20 | Status: AC

## 2021-03-14 LAB — CUP PACEART REMOTE DEVICE CHECK
Date Time Interrogation Session: 20220723095244
Implantable Pulse Generator Implant Date: 20220517

## 2021-04-04 NOTE — Progress Notes (Signed)
Carelink Summary Report / Loop Recorder 

## 2021-04-11 ENCOUNTER — Ambulatory Visit: Payer: Medicare Other | Admitting: Physician Assistant

## 2021-04-16 ENCOUNTER — Ambulatory Visit (INDEPENDENT_AMBULATORY_CARE_PROVIDER_SITE_OTHER): Payer: Medicare Other

## 2021-04-16 DIAGNOSIS — I639 Cerebral infarction, unspecified: Secondary | ICD-10-CM

## 2021-04-16 LAB — CUP PACEART REMOTE DEVICE CHECK
Date Time Interrogation Session: 20220825095325
Implantable Pulse Generator Implant Date: 20220517

## 2021-04-18 ENCOUNTER — Encounter: Payer: Self-pay | Admitting: Physician Assistant

## 2021-04-18 ENCOUNTER — Other Ambulatory Visit: Payer: Self-pay

## 2021-04-18 ENCOUNTER — Ambulatory Visit (INDEPENDENT_AMBULATORY_CARE_PROVIDER_SITE_OTHER): Payer: Medicare Other | Admitting: Physician Assistant

## 2021-04-18 VITALS — BP 126/64 | HR 88 | Ht 64.0 in | Wt 151.0 lb

## 2021-04-18 DIAGNOSIS — I10 Essential (primary) hypertension: Secondary | ICD-10-CM | POA: Diagnosis not present

## 2021-04-18 DIAGNOSIS — I639 Cerebral infarction, unspecified: Secondary | ICD-10-CM

## 2021-04-18 DIAGNOSIS — Z8774 Personal history of (corrected) congenital malformations of heart and circulatory system: Secondary | ICD-10-CM

## 2021-04-18 DIAGNOSIS — K219 Gastro-esophageal reflux disease without esophagitis: Secondary | ICD-10-CM

## 2021-04-18 MED ORDER — PANTOPRAZOLE SODIUM 40 MG PO TBEC: 40.0000 mg | DELAYED_RELEASE_TABLET | Freq: Two times a day (BID) | ORAL | 3 refills | Status: DC

## 2021-04-18 NOTE — Progress Notes (Signed)
[facility]   MULTIDISCIPLINARY HEART VALVE CLINIC                                     Cardiology Office Note:    Date:  04/18/2021   ID:  Yolanda Gibbs, DOB 1951/06/01, MRN [id number]  PCP:  Jiles Prows, MD  [facility] Cardiologist:  None  [facility] Electrophysiologist:  None   Referring MD: Jiles Prows, MD   1 month s/p PFO closure   History of Present Illness:    Yolanda Gibbs is a 70 y.o. female with a hx of HTN, cryptogenic CVA, and PFO s/p percutaneous PFO closure (03/09/21) who presents to clinic for follow up.   She woke up on Mother's Day with headache and vision changes. She has a hx of migraines but she did not develop a headache with this. Symptoms resolved temporarily but she then experienced blurry vision. She went for evaluation and was noted to have 'very high blood pressure.' She was started on medication for BP and 1 week later she ultimately went to the ER for evaluation of headache and blurry vision.  She was found to have an acute infarct in the mid right occipital lobe with no associated mass or hemorrhage.  This finding was confirmed on brain MRI. The patient underwent extensive hospital evaluation.  CT and MRI studies of the brain again demonstrated a right occipital stroke.  Labs showed a normal CBC and metabolic panel.  LDL cholesterol was 89.  2D echocardiogram showed normal LV function, normal RV function, mild aortic valve sclerosis, and no other significant abnormalities.  TEE confirmed preserved LV systolic function with LVEF 60 to 65%, normal RV function, no evidence of thrombus in the left atrial appendage, no significant valvular disease, and the presence of a PFO is confirmed with a positive agitated saline study demonstrating bubbles in the left atrium within 3-6 cardiac cycles.  A loop recorder was inserted during this admission which has not shown any afib. Other than her recent diagnosis of hypertension, she had no risk  factors for stroke.  She has no history of hyperlipidemia, atherosclerotic disease, tobacco use, or diabetes.  She was referred to Dr. Excell Seltzer and underwent successful transcatheter PFO closure with a 16 mm Amplatzer atrial septal occluder device on 03/09/21. Post op echo showed normal functioning device with no atrial level shunting.   Today the patient presents to clinic for follow up. No CP or SOB. No LE edema, orthopnea or PND. No dizziness or syncope. No blood in stool or urine. No palpitations.  Has had some headaches when she is hungry. Has some fatigue because she has been less active.    Past Medical History:  Diagnosis Date   Allergy    seasonal   Depression    GERD (gastroesophageal reflux disease)    History of chicken pox    History of colon polyps    benign   Hypertension     Past Surgical History:  Procedure Laterality Date   BUBBLE STUDY  01/02/2021   Procedure: BUBBLE STUDY;  Surgeon: [name], MD;  Location: [facility] ENDOSCOPY;  Service: Cardiovascular;;   LOOP RECORDER INSERTION N/A 01/02/2021   Procedure: LOOP RECORDER INSERTION;  Surgeon: Thurmon Fair, MD;  Location: [facility] INVASIVE CV LAB;  Service: Cardiovascular;  Laterality: N/A;   PATENT FORAMEN OVALE(PFO) CLOSURE N/A 03/09/2021   Procedure: PATENT FORAMEN OVALE (  PFO) CLOSURE;  Surgeon: Tonny Bollman, MD;  Location: [facility] INVASIVE CV LAB;  Service: Cardiovascular;  Laterality: N/A;   TEE WITHOUT CARDIOVERSION N/A 01/02/2021   Procedure: TRANSESOPHAGEAL ECHOCARDIOGRAM (TEE);  Surgeon: [name], MD;  Location: [facility] ENDOSCOPY;  Service: Cardiovascular;  Laterality: N/A;    Current Medications: Current Meds  Medication Sig   aspirin EC 81 MG EC tablet Take 1 tablet (81 mg total) by mouth daily. Swallow whole.   atorvastatin (LIPITOR) 40 MG tablet Take 1 tablet (40 mg total) by mouth daily.   azelastine (ASTELIN) 0.1 % nasal spray Place 2 sprays into both nostrils 2 (two) times daily. Use in  each nostril as directed (Patient taking differently: Place 2 sprays into both nostrils 2 (two) times daily as needed for rhinitis or allergies. Use in each nostril as directed)   Calcium-Phosphorus-Vitamin D (CITRACAL +D3 PO) Take 1 tablet by mouth daily. Vit d 500 IU, calcium 630mg  each   Carboxymethylcellulose Sod PF 0.5 % SOLN Place 1 drop into both eyes in the morning and at bedtime.   clopidogrel (PLAVIX) 75 MG tablet Take 1 tablet (75 mg total) by mouth daily.   Docusate Sodium 100 MG capsule Take 200 mg by mouth at bedtime.   ergocalciferol (VITAMIN D2) 1.25 MG (50000 UT) capsule Take 50,000 Units by mouth every Tuesday.   losartan (COZAAR) 50 MG tablet Take 50 mg by mouth daily.   metoCLOPramide (REGLAN) 5 MG tablet Take 5 mg by mouth at bedtime.   Multiple Vitamins-Minerals (MULTIVITAMIN ADULTS 50+) TABS Take 1 tablet by mouth daily.   Tiotropium Bromide Monohydrate (SPIRIVA RESPIMAT) 2.5 MCG/ACT AERS Inhale 2 puffs into the lungs daily.   venlafaxine XR (EFFEXOR-XR) 75 MG 24 hr capsule TAKE 1 CAPSULE BY MOUTH DAILY WITH BREAKFAST (Patient taking differently: Take 75 mg by mouth daily with breakfast.)   [DISCONTINUED] pantoprazole (PROTONIX) 40 MG tablet Take 1 tablet (40 mg total) by mouth daily.     Allergies:   Darvon [propoxyphene] and Duloxetine   Social History   Socioeconomic History   Marital status: Married    Spouse name: Not on file   Number of children: Not on file   Years of education: Not on file   Highest education level: Not on file  Occupational History   Not on file  Tobacco Use   Smoking status: Never   Smokeless tobacco: Never  Vaping Use   Vaping Use: Never used  Substance and Sexual Activity   Alcohol use: Yes    Alcohol/week: 3.0 - 4.0 standard drinks    Types: 3 - 4 Standard drinks or equivalent per week    Comment: occasional   Drug use: No   Sexual activity: Not on file  Other Topics Concern   Not on file  Social History Narrative    Daughter at Monday (Haroldine Laws) born 59   Daughter Erin 44   Husband is a 80, has a Curator, has a Forensic scientist helps with day to day of the business   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   Completed college   Enjoys watching old movies, needlework, cleaning her house   No pets.    Social Determinants of Health   Financial Resource Strain: Not on file  Food Insecurity: Not on file  Transportation Needs: Not on file  Physical Activity: Not on file  Stress: Not on file  Social Connections: Not on file     Family History: The  patient's family history includes AAA (abdominal aortic aneurysm) in her mother; Arthritis in her maternal grandmother and mother; CAD in her mother; Cancer in her maternal aunt, maternal grandfather, and mother; Depression in her paternal grandmother; Stroke in her father.  ROS:   Please see the history of present illness.    All other systems reviewed and are negative.  EKGs/Labs/Other Studies Reviewed:    The following studies were reviewed today:  03/09/21 PATENT FORAMEN OVALE (PFO) CLOSURE   Conclusion  Successful transcatheter PFO closure with a 16 mm Amplatzer atrial septal occluder device using intracardiac echo and fluoroscopic guidance   Recommendations  Antiplatelet/Anticoag Recommend uninterrupted dual antiplatelet therapy with Aspirin 81mg  daily and Clopidogrel 75mg  daily for 6 months.   _____________________  03/09/21 IMPRESSIONS   1. Limited study; S/P amplatzer atrial septal occluder with no residual  shunt.   2. Left ventricular ejection fraction, by estimation, is 70 to 75%. The  left ventricle has hyperdynamic function.   3. Right ventricular systolic function is normal. The right ventricular  size is normal.   4. The mitral valve is normal in structure.   5. The aortic valve is tricuspid.    EKG:  EKG is NOT ordered today.    Recent Labs: 03/05/2021: BUN 23; Creatinine, Ser  0.61; Hemoglobin 12.8; Platelets 284; Potassium 4.1; Sodium 139  Recent Lipid Panel    Component Value Date/Time   CHOL 190 01/01/2021 0015   TRIG 84 01/01/2021 0015   HDL 84 01/01/2021 0015   CHOLHDL 2.3 01/01/2021 0015   VLDL 17 01/01/2021 0015   LDLCALC 89 01/01/2021 0015     Risk Assessment/Calculations:       Physical Exam:    VS:  BP 126/64   Pulse 88   Ht 5\' 4"  (1.626 m)   Wt 151 lb (68.5 kg)   LMP 08/19/2004   SpO2 98%   BMI 25.92 kg/m     Wt Readings from Last 3 Encounters:  04/18/21 151 lb (68.5 kg)  03/09/21 148 lb (67.1 kg)  02/21/21 150 lb 3.2 oz (68.1 kg)     GEN:  Well nourished, well developed in no acute distress HEENT: Normal NECK: No JVD; No carotid bruits LYMPHATICS: No lymphadenopathy CARDIAC: RRR, no murmurs, rubs, gallops RESPIRATORY:  Clear to auscultation without rales, wheezing or rhonchi  ABDOMEN: Soft, non-tender, non-distended MUSCULOSKELETAL:  No edema; No deformity  SKIN: Warm and dry NEUROLOGIC:  Alert and oriented x 3 PSYCHIATRIC:  Normal affect   ASSESSMENT:    1. S/P percutaneous patent foramen ovale closure   2. Cryptogenic stroke (HCC)   3. Essential hypertension   4. Gastroesophageal reflux disease, unspecified whether esophagitis present    PLAN:    In order of problems listed above:  PFO s/p percutaneous PFO closure: doing well. Groin site healing well. Continue aspirin and plavix x 6 months followed by aspirin alone indefinitely. SBE prophylaxis discussed; she plans to defer dental work. I will see her back in 1  year with echo with bubble.   Cryptogenic CVA: now s/p PFO closure. Wearing loop recorder. Continue statin   HTN: BP well controlled. Continue Losartan 50mg  daily.   GERD: Nexium was changed to Protonix given potential drug drug interaction with Plavix. This is not controlling her GERD. We will try to go to 40mg  BID and see if this helps. If this doesn't work we may have to put her back on Nexium knowing  that the plavix may be less effective.  Medication Adjustments/Labs and Tests Ordered: Current medicines are reviewed at length with the patient today.  Concerns regarding medicines are outlined above.  No orders of the defined types were placed in this encounter.  Meds ordered this encounter  Medications   pantoprazole (PROTONIX) 40 MG tablet    Sig: Take 1 tablet (40 mg total) by mouth 2 (two) times daily.    Dispense:  180 tablet    Refill:  3    Increased to BID     Patient Instructions  Medication Instructions:  Change pantoprazole (Protonix) 40 mg to ONE TABLET TWICE DAILY  *If you need a refill on your cardiac medications before your next appointment, please call your pharmacy*   Lab Work: none  Testing/Procedures: none  Follow-Up: At [facility], you and your health needs are our priority.  As part of our continuing mission to provide you with exceptional heart care, we have created designated Provider Care Teams.  These Care Teams include your primary Cardiologist (physician) and Advanced Practice Providers (APPs -  Physician Assistants and Nurse Practitioners) who all work together to provide you with the care you need, when you need it.   Your next appointment:   12 month(s)  The format for your next appointment:   In Person  Provider:   Carlean JewsKatie Jaline Pincock, PA-C   Other Instructions     Signed, Cline CrockKathryn Naly Schwanz, PA-C  04/18/2021 2:11 PM    [facility]

## 2021-04-18 NOTE — Patient Instructions (Signed)
Medication Instructions:  Change pantoprazole (Protonix) 40 mg to ONE TABLET TWICE DAILY  *If you need a refill on your cardiac medications before your next appointment, please call your pharmacy*   Lab Work: none  Testing/Procedures: none  Follow-Up: At [facility], you and your health needs are our priority.  As part of our continuing mission to provide you with exceptional heart care, we have created designated Provider Care Teams.  These Care Teams include your primary Cardiologist (physician) and Advanced Practice Providers (APPs -  Physician Assistants and Nurse Practitioners) who all work together to provide you with the care you need, when you need it.   Your next appointment:   12 month(s)  The format for your next appointment:   In Person  Provider:   Carlean Jews, PA-C   Other Instructions

## 2021-04-27 NOTE — Progress Notes (Signed)
Carelink Summary Report / Loop Recorder 

## 2021-05-18 IMAGING — MR MR MRA HEAD W/O CM
1 series · 18 of 48 positions shown · non-contrast
Comparison: Head CT 12/31/2020

CLINICAL DATA: Visual changes beginning 1 week ago. Occipital
infarct on CT.



[Series 1: 3d cow · axial · 0.5mm · 0.41mm/px · z∈[-55,+34]mm · 18 of 192 slices shown]
[im 1/192]
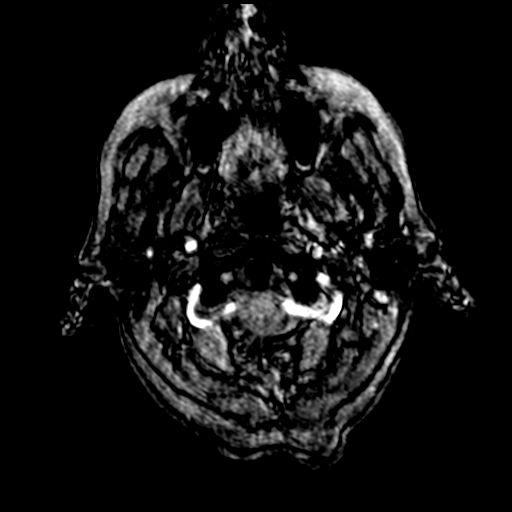
[im 5/192]
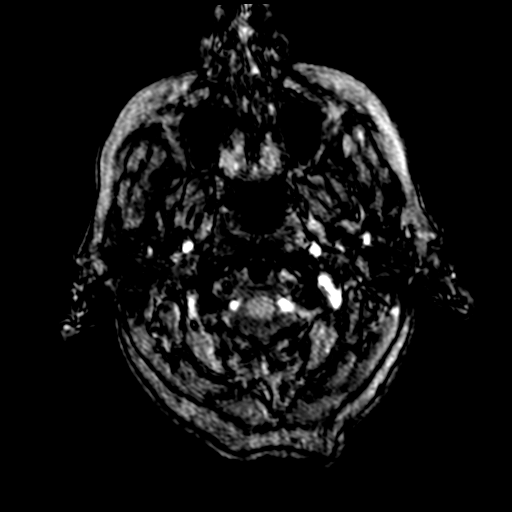
[im 9/192]
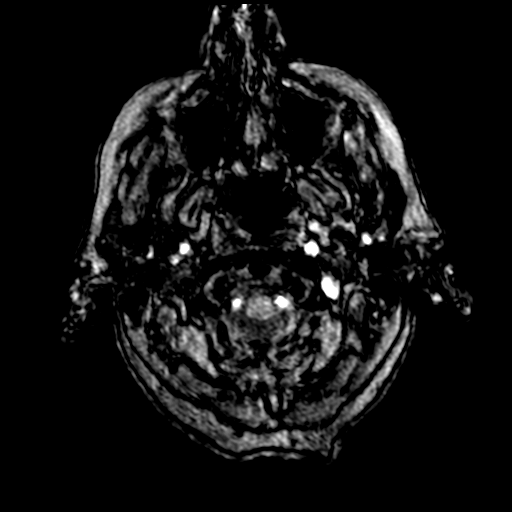
[im 13/192]
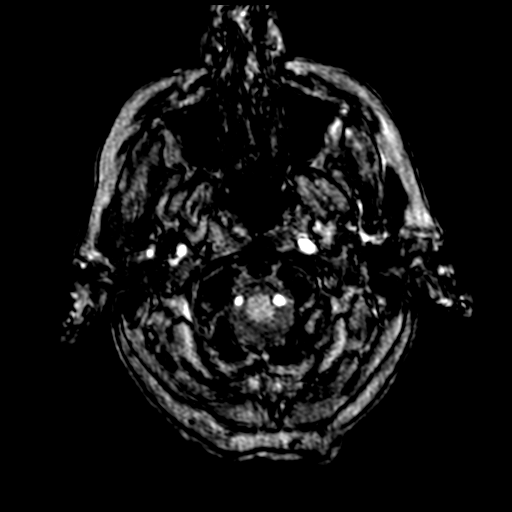
[im 17/192]
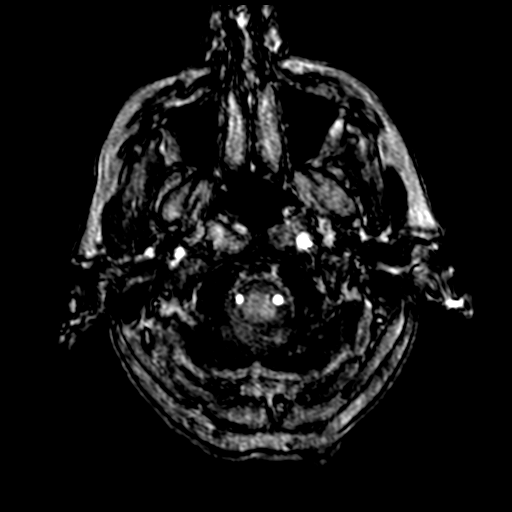
[im 21/192]
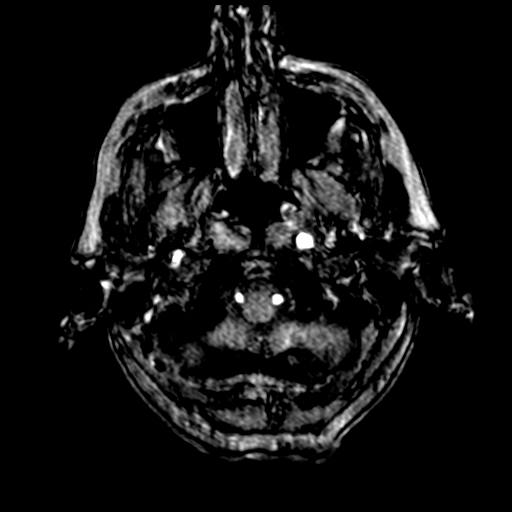
[im 25/192]
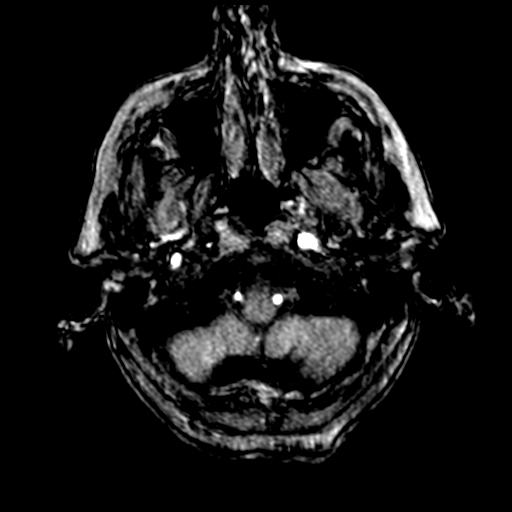
[im 29/192]
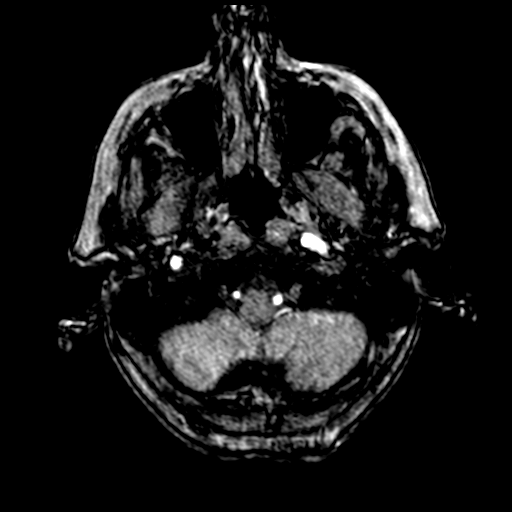
[im 33/192]
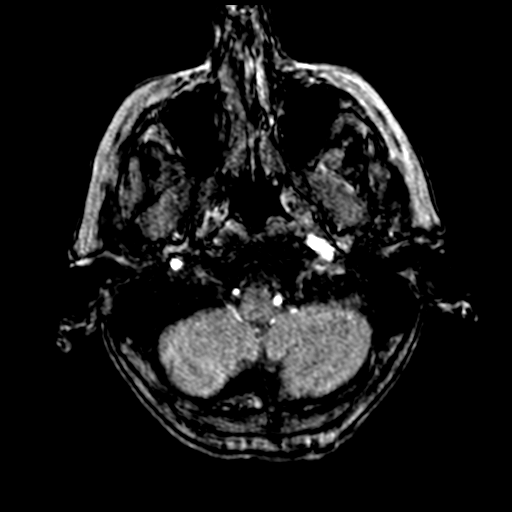
[im 37/192]
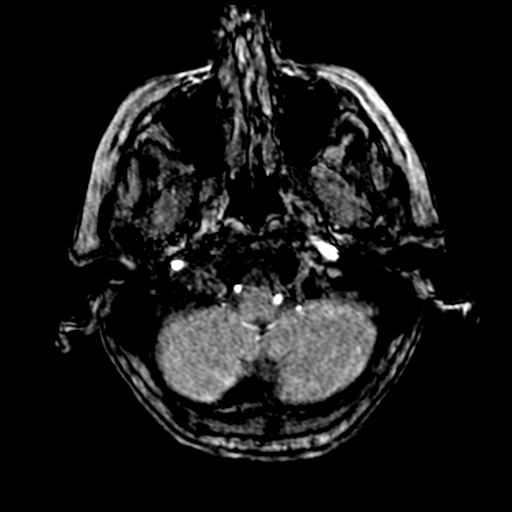
[im 61/192]
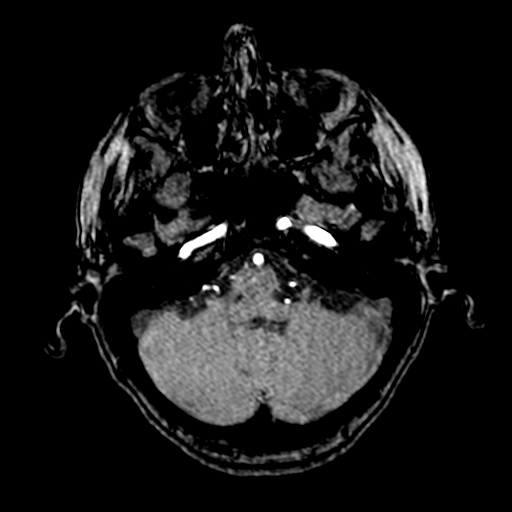
[im 86/192]
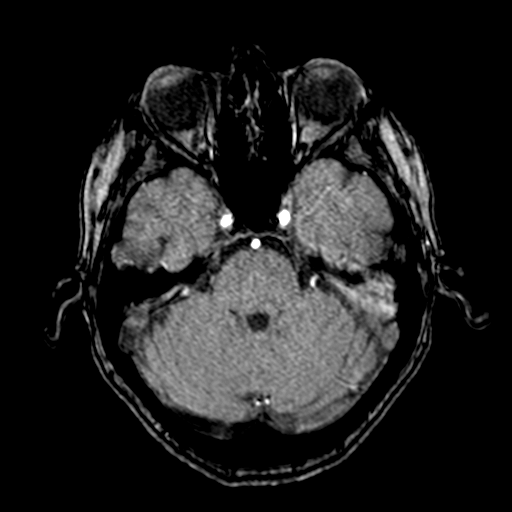
[im 98/192]
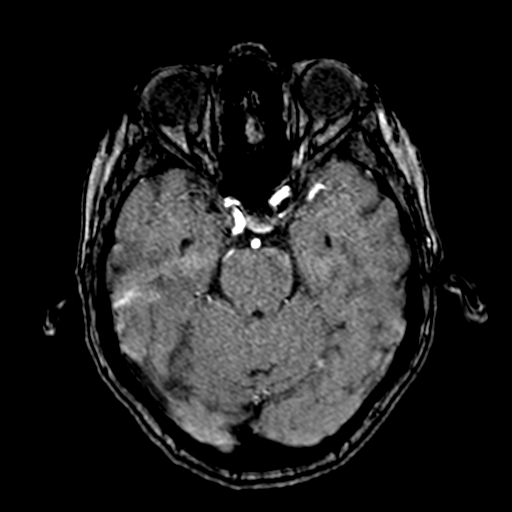
[im 110/192]
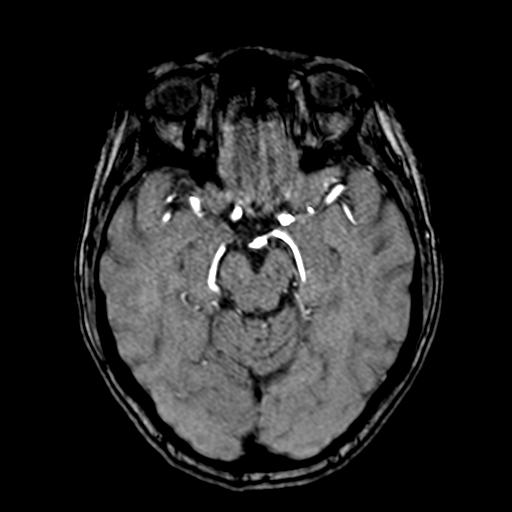
[im 135/192]
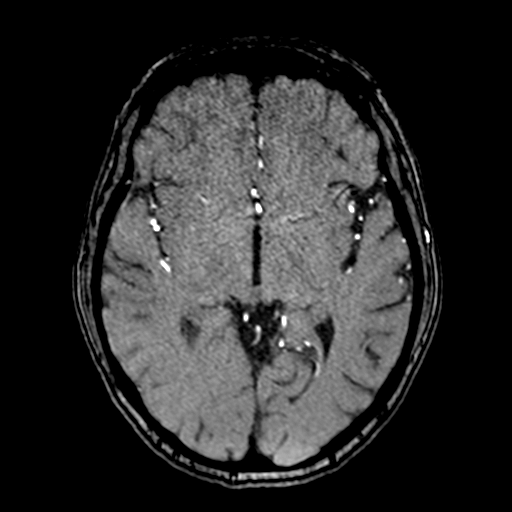
[im 159/192]
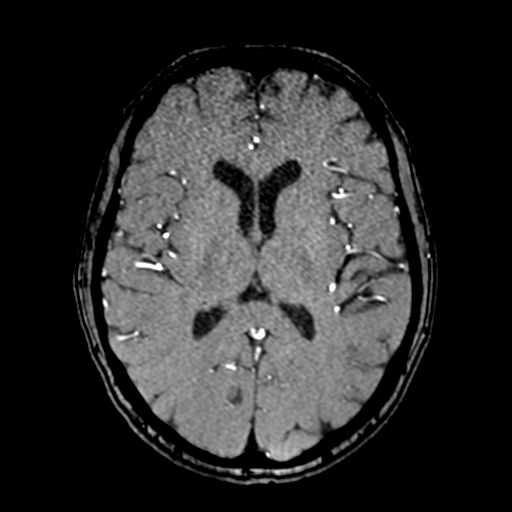
[im 163/192]
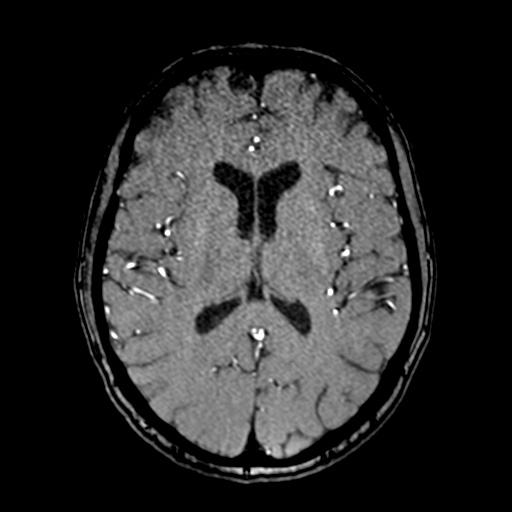
[im 183/192]
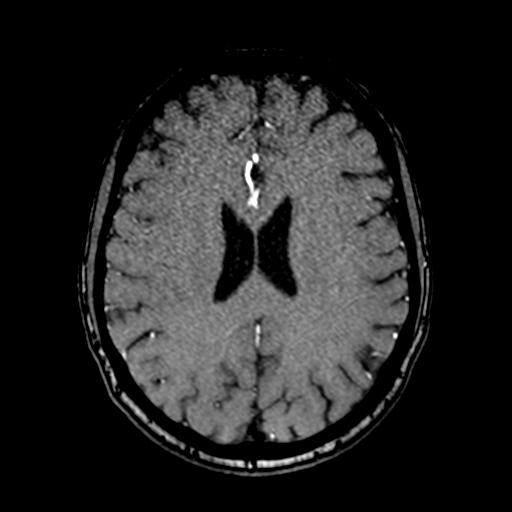

[18 of 48 positions shown; findings below may reference images not displayed]

FINDINGS: MRI HEAD FINDINGS

Brain: There is a 1.5 cm acute or early subacute infarct involving
cortex and subcortical white matter in the right occipital lobe. The
brain is normal in signal elsewhere. No intracranial hemorrhage,
mass, midline shift, or extra-axial fluid collection is identified.
The ventricles and sulci are normal.

Vascular: Major intracranial vascular flow voids are preserved.

Skull and upper cervical spine: Unremarkable bone marrow signal. 13
mm midline frontal skull osteoma.

Sinuses/Orbits: Unremarkable orbits. Mild bilateral ethmoid air cell
mucosal thickening. Clear mastoid air cells.

Other: None.

MRA HEAD FINDINGS

The study is mildly motion degraded.

The intracranial vertebral arteries are widely patent to the basilar
with the left being mildly dominant. Patent PICA, AICA, and SCA
origins are seen bilaterally. The basilar artery is widely patent.
Posterior communicating arteries are not clearly identified and may
be diminutive or absent. Both PCAs are patent without evidence of a
significant proximal stenosis.

The internal carotid arteries are patent from skull base to carotid
termini. Focal partial signal loss involving the right greater than
left ICAs at the level of the anterior genu may be artifactual due
to motion and vessel orientation or indicative of underlying
stenoses with a severe stenosis possible on the right. Of note, no
significant atherosclerotic calcification is evident on the
comparison CT which would favor the appearance on MRA being
artifactual. ACAs and MCAs are patent without evidence of a proximal
branch occlusion or flow limiting proximal stenosis. The right A1
segment is hypoplastic and poorly visualized. No aneurysm is
identified.

MRA NECK FINDINGS

Assessment is limited by intermittent mild to moderate motion
artifact and noncontrast technique.

There is a standard 3 vessel aortic arch. The common carotid and
cervical internal carotid arteries are patent without evidence of a
dissection or definite significant stenosis allowing for
intermittent motion artifact.

The vertebral arteries are patent with antegrade flow bilaterally.
The proximal V1 segments are poorly seen due to motion artifact. No
significant stenosis or dissection is evident involving either
vertebral artery more distally in the neck.
IMPRESSION: 1. Small acute or early subacute right occipital infarct.
2. Artifact versus stenosis at the anterior genu of the right
greater than left internal carotid arteries with artifact favored.
Otherwise negative head MRA.
3. Motion degraded neck MRA. Patent cervical carotid and vertebral
arteries.

## 2021-05-21 ENCOUNTER — Ambulatory Visit (INDEPENDENT_AMBULATORY_CARE_PROVIDER_SITE_OTHER): Payer: Medicare Other

## 2021-05-21 DIAGNOSIS — I639 Cerebral infarction, unspecified: Secondary | ICD-10-CM | POA: Diagnosis not present

## 2021-05-21 LAB — CUP PACEART REMOTE DEVICE CHECK
Date Time Interrogation Session: 20220927095627
Implantable Pulse Generator Implant Date: 20220517

## 2021-05-23 ENCOUNTER — Encounter: Payer: Self-pay | Admitting: Adult Health

## 2021-05-23 ENCOUNTER — Other Ambulatory Visit: Payer: Self-pay

## 2021-05-23 ENCOUNTER — Ambulatory Visit (INDEPENDENT_AMBULATORY_CARE_PROVIDER_SITE_OTHER): Payer: Medicare Other | Admitting: Adult Health

## 2021-05-23 VITALS — BP 146/92 | HR 71 | Ht 64.0 in | Wt 153.0 lb

## 2021-05-23 DIAGNOSIS — E785 Hyperlipidemia, unspecified: Secondary | ICD-10-CM

## 2021-05-23 DIAGNOSIS — I10 Essential (primary) hypertension: Secondary | ICD-10-CM

## 2021-05-23 DIAGNOSIS — Q2112 Patent foramen ovale: Secondary | ICD-10-CM | POA: Diagnosis not present

## 2021-05-23 DIAGNOSIS — I639 Cerebral infarction, unspecified: Secondary | ICD-10-CM

## 2021-05-23 NOTE — Progress Notes (Signed)
[facility] [address] [place]. [place] [address] [phone]       HOSPITAL FOLLOW UP NOTE  [name] Date of Birth:  1950-11-21 Medical Record Number:  [id number]   Reason for Referral:  hospital stroke follow up    SUBJECTIVE:   CHIEF COMPLAINT:  Chief Complaint  Patient presents with   Follow-up    RM 2 Pt is well and stable, vision is about the same but overall doing well. No new concerns      HPI:   Yolanda Gibbs is a 70 y.o. female with a history of hypertension and migraine who presented on 12/31/2020 with 1 week history of visual changes describing and zigzag lines more prominent in the left.  Personally reviewed hospitalization pertinent progress notes, lab work and imaging with summary provided.  She initially was seen by her optometrist with no abnormality noted.  Was seen by PCP due to elevated BP no started on losartan but symptoms did not improve.  She eventually sought care at [facility] for CT head showed subacute occipital stroke and transferred to [facility].  Evaluated by Dr. Roda Shutters with right occipital stroke likely due to migraine vs cardioembolic source.  TEE showed small PFO evaluated by cardiology Dr. Excell Seltzer for possible closure although low suspicion stroke etiology given size and age.  Placement of loop recorder for possible atrial fibrillation and stroke etiology.  Recommended DAPT for 3 weeks and aspirin HTN stable.  LDL 89 and started atorvastatin 40 mg daily.  Other stroke risk factors include advanced age, current EtOH use (3-4 drinks/week), family history of stroke and ocular migraines.  Residual subjective left visual field blurred vision (unable to appreciate visual field deficit on exam).  Evaluated by therapies and discharged home in stable condition without therapy needs.  Stroke - right occipital small stroke likely due to migraine versus cardioembolic source. CT - Focal area of decreased attenuation in the  posterior aspect of the mid right occipital lobe, felt to represent a recent and potential acute focal infarct in this area MRI: right occipital small infarct MRA head and neck: negative 2D Echo: EF 60-65%, mild LVH LE venous Doppler negative for DVT VTE prophylaxis - SCDs, Lovenox TEE: Positive bubble study but negative with color doppler, consistent with small PFO Loop recorder implant (01/02/2021) A1c 6.2 LDL 89 On DAPT with plavix and ASA x 3 weeks then ASA alone. Therapy recommendations: Cleared for home by PT/OT with no needs Disposition:  Home         Returns for 60-month stroke follow-up.  She was previously seen for initial visit 02/14/2021.  Overall stable.  Continued left peripheral vision impairment - stable. Followed by ophthalmology (prior visit in Aug - has f/u 11/23).  Reports worsening blurred vision close up and issues with floaters (not a new issue).  Prior complaints of mild frontal headache with some improvement still after looking at a computer for prolonged period of time or with hunger. Continued fatigue with activity - some improvement since prior visit. Continues to maintain all ADLs and IADLs independently as well as driving without difficulty.  Denies new stroke/TIA symptoms.  S/p successful PFO closure 03/09/2021 with Dr. Excell Seltzer.  Currently maintaining aspirin and Plavix tolerating without side effects for total of 83-month duration.  Compliant on atorvastatin without associated side effects.  Blood pressure today 146/92. Has not been recently monitored at home as previously stable.  Loop recorder has not shown atrial fibrillation thus far. PCP recently  completed lab work which was satisfactory (unable to view via epic).  No further concerns at this time.           ROS:   14 system review of systems performed and negative with exception of those listed in HPI  PMH:  Past Medical History:  Diagnosis Date   Allergy    seasonal   Depression    GERD  (gastroesophageal reflux disease)    History of chicken pox    History of colon polyps    benign   Hypertension     PSH:  Past Surgical History:  Procedure Laterality Date   BUBBLE STUDY  01/02/2021   Procedure: BUBBLE STUDY;  Surgeon: [name], MD;  Location: [facility] ENDOSCOPY;  Service: Cardiovascular;;   LOOP RECORDER INSERTION N/A 01/02/2021   Procedure: LOOP RECORDER INSERTION;  Surgeon: Thurmon Fair, MD;  Location: [facility] INVASIVE CV LAB;  Service: Cardiovascular;  Laterality: N/A;   PATENT FORAMEN OVALE(PFO) CLOSURE N/A 03/09/2021   Procedure: PATENT FORAMEN OVALE (PFO) CLOSURE;  Surgeon: Tonny Bollman, MD;  Location: [facility] INVASIVE CV LAB;  Service: Cardiovascular;  Laterality: N/A;   TEE WITHOUT CARDIOVERSION N/A 01/02/2021   Procedure: TRANSESOPHAGEAL ECHOCARDIOGRAM (TEE);  Surgeon: [name], MD;  Location: [facility] ENDOSCOPY;  Service: Cardiovascular;  Laterality: N/A;    Social History:  Social History   Socioeconomic History   Marital status: Married    Spouse name: Not on file   Number of children: Not on file   Years of education: Not on file   Highest education level: Not on file  Occupational History   Not on file  Tobacco Use   Smoking status: Never   Smokeless tobacco: Never  Vaping Use   Vaping Use: Never used  Substance and Sexual Activity   Alcohol use: Yes    Alcohol/week: 3.0 - 4.0 standard drinks    Types: 3 - 4 Standard drinks or equivalent per week    Comment: occasional   Drug use: No   Sexual activity: Not on file  Other Topics Concern   Not on file  Social History Narrative   Daughter at Haroldine Laws (Julian Hy) born 6   Daughter Erin 30   Husband is a Curator, has a Forensic scientist, has a Landscape architect helps with day to day of the business   Second marriage- prior to remarrying worked in Passenger transport manager.   Completed college   Enjoys watching old movies, needlework, cleaning her house   No pets.    Social  Determinants of Health   Financial Resource Strain: Not on file  Food Insecurity: Not on file  Transportation Needs: Not on file  Physical Activity: Not on file  Stress: Not on file  Social Connections: Not on file  Intimate Partner Violence: Not on file    Family History:  Family History  Problem Relation Age of Onset   Arthritis Mother    CAD Mother    AAA (abdominal aortic aneurysm) Mother    Cancer Mother        lung   Stroke Father    Cancer Maternal Aunt        breast   Arthritis Maternal Grandmother    Cancer Maternal Grandfather        prostate   Depression Paternal Grandmother     Medications:   Current Outpatient Medications on File Prior to Visit  Medication Sig Dispense Refill   aspirin EC 81 MG EC tablet Take 1 tablet (81  mg total) by mouth daily. Swallow whole. 30 tablet 1   atorvastatin (LIPITOR) 40 MG tablet Take 1 tablet (40 mg total) by mouth daily. 30 tablet 1   azelastine (ASTELIN) 0.1 % nasal spray Place 2 sprays into both nostrils 2 (two) times daily. Use in each nostril as directed (Patient taking differently: Place 2 sprays into both nostrils 2 (two) times daily as needed for rhinitis or allergies. Use in each nostril as directed) 30 mL 12   Calcium-Phosphorus-Vitamin D (CITRACAL +D3 PO) Take 1 tablet by mouth daily. Vit d 500 IU, calcium 630mg  each     Carboxymethylcellulose Sod PF 0.5 % SOLN Place 1 drop into both eyes in the morning and at bedtime.     clopidogrel (PLAVIX) 75 MG tablet Take 1 tablet (75 mg total) by mouth daily. 90 tablet 1   Docusate Sodium 100 MG capsule Take 200 mg by mouth at bedtime.     ergocalciferol (VITAMIN D2) 1.25 MG (50000 UT) capsule Take 50,000 Units by mouth every Tuesday.     metoCLOPramide (REGLAN) 5 MG tablet Take 5 mg by mouth at bedtime.     Multiple Vitamins-Minerals (MULTIVITAMIN ADULTS 50+) TABS Take 1 tablet by mouth daily.     pantoprazole (PROTONIX) 40 MG tablet Take 1 tablet (40 mg total) by mouth 2 (two)  times daily. 180 tablet 3   Tiotropium Bromide Monohydrate (SPIRIVA RESPIMAT) 2.5 MCG/ACT AERS Inhale 2 puffs into the lungs daily.     venlafaxine XR (EFFEXOR-XR) 75 MG 24 hr capsule TAKE 1 CAPSULE BY MOUTH DAILY WITH BREAKFAST (Patient taking differently: Take 75 mg by mouth daily with breakfast.) 90 capsule 0   No current facility-administered medications on file prior to visit.    Allergies:   Allergies  Allergen Reactions   Darvon [Propoxyphene] Other (See Comments)    "Slept for 3 days"   Duloxetine     Added from [facility]- patient is unsure       OBJECTIVE:  Physical Exam  Vitals:   05/23/21 1011  BP: (!) 146/92  Pulse: 71  Weight: 153 lb (69.4 kg)  Height: 5\' 4"  (1.626 m)    Body mass index is 26.26 kg/m. No results found.  General: well developed, well nourished, very pleasant middle-age Caucasian female, seated, in no evident distress Head: head normocephalic and atraumatic.   Neck: supple with no carotid or supraclavicular bruits Cardiovascular: regular rate and rhythm, no murmurs Musculoskeletal: no deformity Skin:  no rash/petichiae Vascular:  Normal pulses all extremities   Neurologic Exam Mental Status: Awake and fully alert. Fluent speech and language.  Oriented to place and time. Recent and remote memory intact. Attention span, concentration and fund of knowledge appropriate. Mood and affect appropriate.  Cranial Nerves: Pupils equal, briskly reactive to light. Extraocular movements full without nystagmus. Visual fields full to confrontation although subjective bilateral left upper blurred vision.  Hearing intact. Facial sensation intact. Face, tongue, palate moves normally and symmetrically.  Motor: Normal bulk and tone. Normal strength in all tested extremity muscles Sensory.: intact to touch , pinprick , position and vibratory sensation.  Coordination: Rapid alternating movements normal in all extremities. Finger-to-nose and heel-to-shin  performed accurately bilaterally. Gait and Station: Arises from chair without difficulty. Stance is normal. Gait demonstrates normal stride length and balance without use of assistive device. Tandem walk and heel toe without difficulty.  Reflexes: 1+ and symmetric. Toes downgoing.         ASSESSMENT: Yolanda Gibbs is a 70  y.o. year old female with recent right occipital stroke embolic secondary to unknown source on 12/31/2020 s/p ILR. Vascular risk factors include HTN, HLD, small PFO, advanced age and ocular migraines.      PLAN:  R occipital stroke, cryptogenic:  Residual deficit: Left peripheral visual impairment.  Routinely followed by ophthalmology.  Does not interfere with daily activity or functioning.  Loop recorder has not shown atrial fibrillation thus far  Continue aspirin 81 mg daily  and atorvastatin 40 mg daily for secondary stroke prevention.   Discussed secondary stroke prevention measures and importance of close PCP follow up for aggressive stroke risk factor management  HTN: BP goal <130/90.  Stable on current regimen per PCP HLD: LDL goal <70.  Continue atorvastatin 40 mg daily per PCP.  Reports recent lab work by PCP which was satisfactory PFO: s/p successful closure 03/09/2021 by Dr. Excell Seltzer. Per cardiology, continue aspirin and Plavix for 6 months followed by aspirin alone indefinitely and follow-up visit in 1 year (around 03/2022) for repeat echo with bubble At risk for sleep apnea: Discussed further evaluation for possible sleep apnea especially with occasional insomnia, continued daytime fatigue, snoring, frontal headaches, treatment for blood pressure and prior stroke.  She declines interest at this time.  Educated on importance and increased risk of untreated sleep apnea which she verbalized understanding.  She will call office if she is interested in pursuing in the future    Follow up in 6 months or call earlier if needed   CC:  PCP: Jiles Prows, MD     I spent 34 minutes of face-to-face and non-face-to-face time with patient.  This included previsit chart review, lab review, study review, electronic health record documentation, and patient education and discussion regarding prior stroke and possible etiologies as well as secondary stroke prevention measures and importance of aggressive stroke risk factor management, residual deficits, PFO s/p closure and answered all other questions to patient satisfaction  Ihor Austin, AGNP-BC  [facility] [address] [place], [place] [address]  Phone [phone] Fax [phone] Note: This document was prepared with digital dictation and possible smart phrase technology. Any transcriptional errors that result from this process are unintentional.

## 2021-05-23 NOTE — Patient Instructions (Addendum)
Continue aspirin 81 mg daily and clopidogrel 75 mg daily  and atorvastatin  for secondary stroke prevention  Continue to follow up with PCP regarding cholesterol and blood pressure management  Maintain strict control of hypertension with blood pressure goal below 130/90 and cholesterol with LDL cholesterol (bad cholesterol) goal below 70 mg/dL.   Would recommend consideration for sleep apnea evaluation  Your loop recorder will continue to be monitored by cardiology - has not shown atrial fibrillation thus far    Followup in the future with me in 6 months or call earlier if needed       Thank you for coming to see [place] at [facility]. I hope we have been able to provide you high quality care today.  You may receive a patient satisfaction survey over the next few weeks. We would appreciate your feedback and comments so that we may continue to improve ourselves and the health of our patients.    Sleep Apnea Sleep apnea is a condition in which breathing pauses or becomes shallow during sleep. People with sleep apnea usually snore loudly. They may have times when they gasp and stop breathing for 10 seconds or more during sleep. This may happen many times during the night. Sleep apnea disrupts your sleep and keeps your body from getting the rest that it needs. This condition can increase your risk of certain health problems, including: Heart attack. Stroke. Obesity. Type 2 diabetes. Heart failure. Irregular heartbeat. High blood pressure. The goal of treatment is to help you breathe normally again. What are the causes? The most common cause of sleep apnea is a collapsed or blocked airway. There are three kinds of sleep apnea: Obstructive sleep apnea. This kind is caused by a blocked or collapsed airway. Central sleep apnea. This kind happens when the part of the brain that controls breathing does not send the correct signals to the muscles that control  breathing. Mixed sleep apnea. This is a combination of obstructive and central sleep apnea. What increases the risk? You are more likely to develop this condition if you: Are overweight. Smoke. Have a smaller than normal airway. Are older. Are female. Drink alcohol. Take sedatives or tranquilizers. Have a family history of sleep apnea. Have a tongue or tonsils that are larger than normal. What are the signs or symptoms? Symptoms of this condition include: Trouble staying asleep. Loud snoring. Morning headaches. Waking up gasping. Dry mouth or sore throat in the morning. Daytime sleepiness and tiredness. If you have daytime fatigue because of sleep apnea, you may be more likely to have: Trouble concentrating. Forgetfulness. Irritability or mood swings. Personality changes. Feelings of depression. Sexual dysfunction. This may include loss of interest if you are female, or erectile dysfunction if you are female. How is this diagnosed? This condition may be diagnosed with: A medical history. A physical exam. A series of tests that are done while you are sleeping (sleep study). These tests are usually done in a sleep lab, but they may also be done at home. How is this treated? Treatment for this condition aims to restore normal breathing and to ease symptoms during sleep. It may involve managing health issues that can affect breathing, such as high blood pressure or obesity. Treatment may include: Sleeping on your side. Using a decongestant if you have nasal congestion. Avoiding the use of depressants, including alcohol, sedatives, and narcotics. Losing weight if you are overweight. Making changes to your diet. Quitting smoking. Using a device to open your  airway while you sleep, such as: An oral appliance. This is a custom-made mouthpiece that shifts your lower jaw forward. A continuous positive airway pressure (CPAP) device. This device blows air through a mask when you breathe  out (exhale). A nasal expiratory positive airway pressure (EPAP) device. This device has valves that you put into each nostril. A bi-level positive airway pressure (BPAP) device. This device blows air through a mask when you breathe in (inhale) and breathe out (exhale). Having surgery if other treatments do not work. During surgery, excess tissue is removed to create a wider airway. Follow these instructions at home: Lifestyle Make any lifestyle changes that your health care provider recommends. Eat a healthy, well-balanced diet. Take steps to lose weight if you are overweight. Avoid using depressants, including alcohol, sedatives, and narcotics. Do not use any products that contain nicotine or tobacco. These products include cigarettes, chewing tobacco, and vaping devices, such as e-cigarettes. If you need help quitting, ask your health care provider. General instructions Take over-the-counter and prescription medicines only as told by your health care provider. If you were given a device to open your airway while you sleep, use it only as told by your health care provider. If you are having surgery, make sure to tell your health care provider you have sleep apnea. You may need to bring your device with you. Keep all follow-up visits. This is important. Contact a health care provider if: The device that you received to open your airway during sleep is uncomfortable or does not seem to be working. Your symptoms do not improve. Your symptoms get worse. Get help right away if: You develop: Chest pain. Shortness of breath. Discomfort in your back, arms, or stomach. You have: Trouble speaking. Weakness on one side of your body. Drooping in your face. These symptoms may represent a serious problem that is an emergency. Do not wait to see if the symptoms will go away. Get medical help right away. Call your local emergency services (911 in the [place] Do not drive yourself to the  hospital. Summary Sleep apnea is a condition in which breathing pauses or becomes shallow during sleep. The most common cause is a collapsed or blocked airway. The goal of treatment is to restore normal breathing and to ease symptoms during sleep. This information is not intended to replace advice given to you by your health care provider. Make sure you discuss any questions you have with your health care provider. Document Revised: 07/14/2020 Document Reviewed: 07/14/2020 Elsevier Patient Education  2022 [employer].

## 2021-05-28 NOTE — Progress Notes (Signed)
Carelink Summary Report / Loop Recorder 

## 2021-06-13 ENCOUNTER — Telehealth: Payer: Self-pay | Admitting: Cardiovascular Disease

## 2021-06-13 NOTE — Telephone Encounter (Signed)
   [place] Pre-operative Risk Assessment    Patient Name: Yolanda Gibbs  DOB: [birth year] MRN: [id number]  HEARTCARE STAFF:  - IMPORTANT!!!!!! Under Visit Info/Reason for Call, type in Other and utilize the format Clearance MM/DD/YY or Clearance TBD. Do not use dashes or single digits. - Please review there is not already an duplicate clearance open for this procedure. - If request is for dental extraction, please clarify the # of teeth to be extracted. - If the patient is currently at the dentist's office, call Pre-Op Callback Staff (MA/nurse) to input urgent request.  - If the patient is not currently in the dentist office, please route to the Pre-Op pool.  Request for surgical clearance:  What type of surgery is being performed? Dental Cleaning  When is this surgery scheduled? 06/13/21  What type of clearance is required (medical clearance vs. Pharmacy clearance to hold med vs. Both)? If she needs to be pre-medicated.   Are there any medications that need to be held prior to surgery and how long?   Practice name and name of physician performing surgery? [facility]   What is the office phone number? [phone]   7.   What is the office fax number? [phone]  8.   Anesthesia type (None, local, MAC, general) ? none   Ermelinda Das 06/13/2021, 10:34 AM  _________________________________________________________________   (provider comments below)

## 2021-06-13 NOTE — Telephone Encounter (Signed)
S/w the dental office to advised the pt will need SBE, see notes from pre op provider Ronie Spies, PAC. Per dental office it is confirmed the pt is not allergic to Amoxicillin. I asked if our office needs to call in SBE. I was told by DDS office they have Amoxicillin in the office and will proceed with the directions that were given by pre op provider Ronie Spies, PAC. I will fax these notes as well as to complete the clearance notes.

## 2021-06-13 NOTE — Telephone Encounter (Addendum)
   Patient Name: Yolanda Gibbs  DOB: [birth year] MRN: [id number]  Primary Cardiologist: Dr. Excell Seltzer  Chart reviewed as part of pre-operative protocol coverage.   IF SIMPLE EXTRACTION/CLEANINGS: Simple dental extractions are considered low risk procedures per guidelines and generally do not require any specific cardiac clearance. It is also generally accepted that for simple extractions and dental cleanings, there is no need to interrupt blood thinner therapy.  She had a PFO closure 03/09/2021 therefore SBE prophylaxis is required for the patient from a cardiac standpoint for 6 months following that operation.  Will route to callback team to call dentist office/patient - please confirm no allergy to amoxicillin - would advise amoxicillin 500mg  take 4 tablets by mouth 30-60 minutes prior to any dental procedure including cleanings during this timeframe. See if dentist has this available in office (if patient is there waiting); if not, please offer to call in #4 with 1 refill to pt's preferred local pharmacy.  In addition to callback above, will route this bundled recommendation to requesting provider via Epic fax function. Please call with questions.   , PA-C 06/13/2021, 10:45 AM

## 2021-06-19 LAB — CUP PACEART REMOTE DEVICE CHECK
Date Time Interrogation Session: 20221030095343
Implantable Pulse Generator Implant Date: 20220517

## 2021-06-20 ENCOUNTER — Telehealth: Payer: Self-pay | Admitting: Adult Health

## 2021-06-20 NOTE — Telephone Encounter (Signed)
Called patient who stated she doesn't think she's having occipital stroke, states her eyes haven't changed. Yesterday she had headache all day and two Tylenol barely touched it.  Today she's better.  Denies other symptoms.  She sometimes wakes with headache, it gets better after she eats. They occur maybe once a day or every other day and are in her temples. I advised will let NP know and call her back Patient verbalized understanding, appreciation.

## 2021-06-20 NOTE — Telephone Encounter (Signed)
Pt has asked that Shanda Bumps , NP be informed that she is having an increase in headaches, not the occular ones.  Pt does not feel the need to go to ED but would like a call to discuss.

## 2021-06-21 ENCOUNTER — Encounter: Payer: Self-pay | Admitting: *Deleted

## 2021-06-21 NOTE — Telephone Encounter (Addendum)
Have sent my chart with NP's advise. Will FU with call later this morning: If she does not want to go to ED, then I would advise her to reach out to her PCP tomorrow for further eval especially with headache in temples - temporal arteritis needs to be ruled out - if any changes in vision or jaw claudication she needs to proceed to ED immediately! I do not have any availability to see her tomorrow.   Called patient and discussed NP's advice. She denied any vision changes or pain or difficulty with eating. I advised she call with any concerns. She stated her headache is better today. She  verbalized understanding, appreciation.

## 2021-06-25 ENCOUNTER — Ambulatory Visit (INDEPENDENT_AMBULATORY_CARE_PROVIDER_SITE_OTHER): Payer: Medicare Other

## 2021-06-25 DIAGNOSIS — I639 Cerebral infarction, unspecified: Secondary | ICD-10-CM

## 2021-06-28 NOTE — Progress Notes (Signed)
Carelink Summary Report / Loop Recorder 

## 2021-07-30 ENCOUNTER — Ambulatory Visit: Payer: Medicare Other

## 2021-08-24 ENCOUNTER — Other Ambulatory Visit: Payer: Self-pay | Admitting: Cardiovascular Disease

## 2021-09-03 ENCOUNTER — Ambulatory Visit: Payer: Medicare Other

## 2021-10-05 ENCOUNTER — Telehealth: Payer: Self-pay | Admitting: Cardiovascular Disease

## 2021-10-05 NOTE — Telephone Encounter (Signed)
Reviewed pt's med list wit her. She was questioning the Plavix. Per OV note by Carlean Jews on 8/22:  PFO s/p percutaneous PFO closure: doing well. Groin site healing well. Continue aspirin and plavix x 6 months followed by aspirin alone indefinitely.  Advised pt that she should continue plavix through this month, then can d/c and do Aspirin only daily thereafter. Pt understands. Scheduled recall so that pt can f/u and see Florentina Addison here for her 1 year in August.

## 2021-10-05 NOTE — Telephone Encounter (Signed)
Patient would like to go over her medications.

## 2021-10-08 ENCOUNTER — Ambulatory Visit (INDEPENDENT_AMBULATORY_CARE_PROVIDER_SITE_OTHER): Payer: Medicare Other

## 2021-10-08 DIAGNOSIS — I639 Cerebral infarction, unspecified: Secondary | ICD-10-CM

## 2021-10-08 LAB — CUP PACEART REMOTE DEVICE CHECK
Date Time Interrogation Session: 20230219230812
Implantable Pulse Generator Implant Date: 20220517

## 2021-10-12 NOTE — Progress Notes (Signed)
Carelink Summary Report / Loop Recorder 

## 2021-10-29 ENCOUNTER — Emergency Department (HOSPITAL_COMMUNITY): Payer: Medicare Other

## 2021-10-29 ENCOUNTER — Emergency Department (HOSPITAL_BASED_OUTPATIENT_CLINIC_OR_DEPARTMENT_OTHER): Payer: Medicare Other

## 2021-10-29 ENCOUNTER — Encounter (HOSPITAL_BASED_OUTPATIENT_CLINIC_OR_DEPARTMENT_OTHER): Payer: Self-pay | Admitting: Emergency Medicine

## 2021-10-29 ENCOUNTER — Emergency Department (HOSPITAL_BASED_OUTPATIENT_CLINIC_OR_DEPARTMENT_OTHER)
Admission: EM | Admit: 2021-10-29 | Discharge: 2021-10-29 | Disposition: A | Discharge status: Home / Self Care | Payer: Medicare Other | Attending: Emergency Medicine | Admitting: Emergency Medicine

## 2021-10-29 ENCOUNTER — Other Ambulatory Visit: Payer: Self-pay

## 2021-10-29 DIAGNOSIS — Z7902 Long term (current) use of antithrombotics/antiplatelets: Secondary | ICD-10-CM | POA: Diagnosis not present

## 2021-10-29 DIAGNOSIS — R42 Dizziness and giddiness: Secondary | ICD-10-CM | POA: Diagnosis present

## 2021-10-29 DIAGNOSIS — R519 Headache, unspecified: Secondary | ICD-10-CM | POA: Diagnosis not present

## 2021-10-29 DIAGNOSIS — Z7982 Long term (current) use of aspirin: Secondary | ICD-10-CM | POA: Diagnosis not present

## 2021-10-29 DIAGNOSIS — H538 Other visual disturbances: Secondary | ICD-10-CM | POA: Insufficient documentation

## 2021-10-29 DIAGNOSIS — Z20822 Contact with and (suspected) exposure to covid-19: Secondary | ICD-10-CM | POA: Diagnosis not present

## 2021-10-29 HISTORY — DX: Transient cerebral ischemic attack, unspecified: G45.9

## 2021-10-29 LAB — BASIC METABOLIC PANEL WITH GFR
Anion gap: 10 (ref 5–15)
BUN: 21 mg/dL (ref 8–23)
CO2: 22 mmol/L (ref 22–32)
Calcium: 9.7 mg/dL (ref 8.9–10.3)
Chloride: 102 mmol/L (ref 98–111)
Creatinine, Ser: 0.72 mg/dL (ref 0.44–1.00)
GFR, Estimated: >60 mL/min (ref >60)
Glucose, Bld: 98 mg/dL (ref 70–99)
Potassium: 4 mmol/L (ref 3.5–5.1)
Sodium: 134 mmol/L — ABNORMAL LOW (ref 135–145)

## 2021-10-29 LAB — CBC WITH DIFFERENTIAL/PLATELET
Abs Immature Granulocytes: 0.01 10*3/uL (ref 0.00–0.07)
Basophils Absolute: 0.1 10*3/uL (ref 0.0–0.1)
Basophils Relative: 1 %
Eosinophils Absolute: 0.2 10*3/uL (ref 0.0–0.5)
Eosinophils Relative: 3 %
HCT: 42.6 % (ref 36.0–46.0)
Hemoglobin: 13.8 g/dL (ref 12.0–15.0)
Immature Granulocytes: 0 %
Lymphocytes Relative: 27 %
Lymphs Abs: 1.7 10*3/uL (ref 0.7–4.0)
MCH: 29.8 pg (ref 26.0–34.0)
MCHC: 32.4 g/dL (ref 30.0–36.0)
MCV: 92 fL (ref 80.0–100.0)
Monocytes Absolute: 0.4 10*3/uL (ref 0.1–1.0)
Monocytes Relative: 7 %
Neutro Abs: 3.9 10*3/uL (ref 1.7–7.7)
Neutrophils Relative %: 62 %
Platelets: 337 10*3/uL (ref 150–400)
RBC: 4.63 MIL/uL (ref 3.87–5.11)
RDW: 13.8 % (ref 11.5–15.5)
WBC: 6.4 10*3/uL (ref 4.0–10.5)
nRBC: 0 % (ref 0.0–0.2)

## 2021-10-29 LAB — TROPONIN I (HIGH SENSITIVITY): Troponin I (High Sensitivity): 3 ng/L (ref <18)

## 2021-10-29 LAB — RESP PANEL BY RT-PCR (FLU A&B, COVID) ARPGX2
Influenza A by PCR: NEGATIVE
Influenza B by PCR: NEGATIVE
SARS Coronavirus 2 by RT PCR: NEGATIVE

## 2021-10-29 LAB — MAGNESIUM: Magnesium: 2 mg/dL (ref 1.7–2.4)

## 2021-10-29 MED ORDER — IOHEXOL 350 MG/ML SOLN
100.0000 mL | Freq: Once | INTRAVENOUS | Status: AC | PRN
  Administered 2021-10-29: 100 mL via INTRAVENOUS

## 2021-10-29 MED ORDER — MECLIZINE HCL 25 MG PO TABS
12.5000 mg | ORAL_TABLET | Freq: Once | ORAL | Status: AC
  Administered 2021-10-29: 12.5 mg via ORAL
  Filled 2021-10-29: qty 1

## 2021-10-29 MED ORDER — LORAZEPAM 2 MG/ML IJ SOLN
1.0000 mg | Freq: Once | INTRAMUSCULAR | Status: AC
  Administered 2021-10-29: 1 mg via INTRAVENOUS
  Filled 2021-10-29: qty 1

## 2021-10-29 MED ORDER — MIDAZOLAM HCL 2 MG/2ML IJ SOLN: 2.0000 mg | Freq: Once | INTRAMUSCULAR | Status: DC | PRN

## 2021-10-29 NOTE — ED Notes (Signed)
Patient transported to MRI 

## 2021-10-29 NOTE — ED Triage Notes (Signed)
reports dizziness x 3 weeks , Hx TIA 12/2020 On aspirin .  ?

## 2021-10-29 NOTE — ED Notes (Signed)
Pt left POV to got to [facility] for MRI ?

## 2021-10-29 NOTE — Discharge Instructions (Addendum)
You were evaluated in the Emergency Department and after careful evaluation, we did not find any emergent condition requiring admission or further testing in the hospital. ? ?Your exam/testing today was overall reassuring. ? ?Please return to the Emergency Department if you experience any worsening of your condition.  Thank you for allowing [place] to be a part of your care.  ?  ?MRI FINDINGS: ?Brain: No restricted diffusion to suggest acute or subacute infarct. ?No acute hemorrhage, mass, mass effect, or midline shift. Small area ?of encephalomalacia in the right occipital lobe, sequela of the ?acute infarct seen on 01/01/2021. Additional small area of cortical ?increased T2 signal in the medial right parietal lobe, also likely ?sequela of small remote infarct. Hydrocephalus or extra-axial ?collection. Minimal T2 hyperintense signal in the periventricular ?white matter, likely the sequela of chronic small vessel ischemic ?disease. ?  ?Vascular: Normal flow voids. ?  ?Skull and upper cervical spine: Normal marrow signal. Degenerative ?changes at C3-C4. ?  ?Sinuses/Orbits: Negative. ?  ?Other: The mastoids are well aerated. ?  ?IMPRESSION: ?No acute intracranial process. No etiology seen for the patient's ?symptoms. ?  ?

## 2021-10-29 NOTE — ED Notes (Signed)
Pt arrived POV to ED as transfer from [facility] for MRI ?

## 2021-10-29 NOTE — ED Notes (Signed)
Pt returned from MRI °

## 2021-10-29 NOTE — ED Provider Notes (Signed)
[facility] EMERGENCY DEPARTMENT Provider Note   CSN: [id number] Arrival date & time: 10/29/21  1024     History  Chief Complaint  Patient presents with   Dizziness    Yolanda Gibbs is a 71 y.o. female with a history of occipital stroke, complex migraines, presenting to the ED with dizziness ongoing for 3 to 4 weeks.  Supplemental history is provided by her husband at bedside.  They report that she has had episodes of lightheadedness and sometimes mild vertiginous symptoms for the past 3 to 4 weeks.  He has also been experiencing near daily headaches, sometimes located in the left lobe, and often associated with blurred or loss of vision in one of her eyes.  She says she does suffer from "occipital migraines" and has had migraine headaches with visual deficits on intermittently for years.  1 year ago in 2022 she was diagnosed with an occipital stroke on MRI of the brain per my review of the external medical records.  She had a motion degraded neck MRI at the time.  She reports she continues to take aspirin but came off of Plavix 2 months ago.  HPI     Home Medications Prior to Admission medications   Medication Sig Start Date End Date Taking? Authorizing Provider  aspirin EC 81 MG EC tablet Take 1 tablet (81 mg total) by mouth daily. Swallow whole. 01/03/21   Tyrone Nine, MD  atorvastatin (LIPITOR) 40 MG tablet Take 1 tablet (40 mg total) by mouth daily. 01/03/21   Tyrone Nine, MD  azelastine (ASTELIN) 0.1 % nasal spray Place 2 sprays into both nostrils 2 (two) times daily. Use in each nostril as directed Patient taking differently: Place 2 sprays into both nostrils 2 (two) times daily as needed for rhinitis or allergies. Use in each nostril as directed 03/18/17   Saguier, Ramon Dredge, PA-C  Calcium-Phosphorus-Vitamin D (CITRACAL +D3 PO) Take 1 tablet by mouth daily. Vit d 500 IU, calcium 630mg  each    [provider]  Carboxymethylcellulose Sod PF 0.5 % SOLN Place 1  drop into both eyes in the morning and at bedtime.    [provider]  clopidogrel (PLAVIX) 75 MG tablet TAKE ONE TABLET BY MOUTH DAILY 08/24/21   10/22/21, MD  Docusate Sodium 100 MG capsule Take 200 mg by mouth at bedtime.    [provider]  ergocalciferol (VITAMIN D2) 1.25 MG (50000 UT) capsule Take 50,000 Units by mouth every Tuesday. Patient not taking: Reported on 10/05/2021    [provider]  metoCLOPramide (REGLAN) 5 MG tablet Take 5 mg by mouth at bedtime. Patient not taking: Reported on 10/05/2021    [provider]  Multiple Vitamins-Minerals (MULTIVITAMIN ADULTS 50+) TABS Take 1 tablet by mouth daily.    [provider]  pantoprazole (PROTONIX) 40 MG tablet Take 1 tablet (40 mg total) by mouth 2 (two) times daily. 04/18/21   04/20/21, PA-C  Tiotropium Bromide Monohydrate (SPIRIVA RESPIMAT) 2.5 MCG/ACT AERS Inhale 2 puffs into the lungs daily.    [provider]  venlafaxine XR (EFFEXOR-XR) 75 MG 24 hr capsule TAKE 1 CAPSULE BY MOUTH DAILY WITH BREAKFAST Patient taking differently: Take 75 mg by mouth daily with breakfast. 07/08/18   07/10/18, NP      Allergies    Darvon [propoxyphene] and Duloxetine    Review of Systems   Review of Systems  Physical Exam Updated Vital Signs BP (!) 145/82 (BP Location: Right  Arm)    Pulse 90    Temp 98.3 F (36.8 C) (Oral)    Resp 16    Ht 5\' 4"  (1.626 m)    Wt 69.4 kg    LMP 08/19/2004    SpO2 97%    BMI 26.26 kg/m  Physical Exam Constitutional:      General: She is not in acute distress. HENT:     Head: Normocephalic and atraumatic.  Eyes:     Conjunctiva/sclera: Conjunctivae normal.     Pupils: Pupils are equal, round, and reactive to light.  Cardiovascular:     Rate and Rhythm: Normal rate and regular rhythm.     Pulses: Normal pulses.  Pulmonary:     Effort: Pulmonary effort is normal. No respiratory distress.  Abdominal:     General: There is no  distension.     Tenderness: There is no abdominal tenderness.  Skin:    General: Skin is warm and dry.  Neurological:     General: No focal deficit present.     Mental Status: She is alert and oriented to person, place, and time. Mental status is at baseline.     Sensory: No sensory deficit.     Motor: No weakness.     Coordination: Coordination normal.     Gait: Gait normal.    ED Results / Procedures / Treatments   Labs (all labs ordered are listed, but only abnormal results are displayed) Labs Reviewed  BASIC METABOLIC PANEL - Abnormal; Notable for the following components:      Result Value   Sodium 134 (*)    All other components within normal limits  RESP PANEL BY RT-PCR (FLU A&B, COVID) ARPGX2  CBC WITH DIFFERENTIAL/PLATELET  MAGNESIUM  TROPONIN I (HIGH SENSITIVITY)    EKG EKG Interpretation  Date/Time:  Monday October 29 2021 11:15:08 EDT Ventricular Rate:  76 PR Interval:  177 QRS Duration: 96 QT Interval:  395 QTC Calculation: 445 R Axis:   62 Text Interpretation: Sinus rhythm Confirmed by [id number] [phone] on 10/29/2021 11:25:20 AM  Radiology CT ANGIO HEAD NECK W WO CM  Result Date: 10/29/2021 CLINICAL DATA:  Monocular vision loss. History of prior right occipital stroke. EXAM: CT ANGIOGRAPHY HEAD AND NECK TECHNIQUE: Multidetector CT imaging of the head and neck was performed using the standard protocol during bolus administration of intravenous contrast. Multiplanar CT image reconstructions and MIPs were obtained to evaluate the vascular anatomy. Carotid stenosis measurements (when applicable) are obtained utilizing NASCET criteria, using the distal internal carotid diameter as the denominator. RADIATION DOSE REDUCTION: This exam was performed according to the departmental dose-optimization program which includes automated exposure control, adjustment of the mA and/or kV according to patient size and/or use of iterative reconstruction technique. CONTRAST:   10/31/2021 OMNIPAQUE IOHEXOL 350 MG/ML SOLN COMPARISON:  MRI 01/01/2021 FINDINGS: CT HEAD FINDINGS Brain: Brainstem and cerebellum are normal. There is an old small right occipital cortical and subcortical infarction as seen on the prior MRI. One could question a small area low-density adjacent to that old infarction that could represent an acute extension. The brain otherwise has a normal appearance. No evidence of other stroke, mass, hemorrhage, hydrocephalus or extra-axial collection. Vascular: No abnormal vascular finding. 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 Sinuses: Clear Orbits: Normal Review of the MIP images confirms the above findings CTA NECK FINDINGS Aortic arch: Aortic atherosclerosis, minimal. Branching pattern is normal. Right carotid system: Common carotid artery widely patent to the bifurcation. Carotid bifurcation is  normal without soft or calcified plaque. Cervical ICA is normal. Left carotid system: Common carotid artery widely patent to the bifurcation. Carotid bifurcation is normal without soft or calcified plaque. Cervical ICA is normal. Vertebral arteries: No proximal subclavian stenosis. Both vertebral artery origins are widely patent. The left vertebral artery is dominant. Both vertebral arteries are widely patent through the cervical region to the foramen magnum. Skeleton: Ordinary cervical spondylosis and facet arthritis. Other neck: No mass or lymphadenopathy. Upper chest: Normal Review of the MIP images confirms the above findings CTA HEAD FINDINGS Anterior circulation: Both internal carotid arteries are patent through the skull base and siphon region. The anterior and middle cerebral vessels are patent. Both anterior cerebral arteries receive there supply from the left carotid circulation. No large vessel occlusion. No proximal stenosis or aneurysm. Posterior circulation: Both vertebral arteries are patent through the foramen magnum to the basilar. No basilar stenosis.  Superior cerebellar and posterior cerebral arteries are patent. Specifically, no sign of right PCA occlusion. Venous sinuses: Patent and normal. Anatomic variants: None significant. Review of the MIP images confirms the above findings IMPRESSION: Normal vascular examination. Mild aortic atherosclerotic calcification. No stenotic or occlusive disease seen within the anterior or posterior circulation either in the neck or in the intracranial segments. Old right occipital cortical and subcortical infarction. One could question mild adjacent cortical low-density as seen on axial image 12 of the head CT, which raises the possibility of acute or subacute extension in that region. No mass effect or hemorrhage. Electronically Signed   By: Paulina FusiMark  Shogry M.D.   On: 10/29/2021 13:53    Procedures Procedures    Medications Ordered in ED Medications  midazolam (VERSED) injection 2 mg (has no administration in time range)  iohexol (OMNIPAQUE) 350 MG/ML injection 100 mL (100 mLs Intravenous Contrast Given 10/29/21 1326)    ED Course/ Medical Decision Making/ A&P Clinical Course as of 10/29/21 1532  Mon Oct 29, 2021  1357 IMPRESSION: Normal vascular examination. Mild aortic atherosclerotic calcification. No stenotic or occlusive disease seen within the anterior or posterior circulation either in the neck or in the intracranial segments.   Old right occipital cortical and subcortical infarction. One could question mild adjacent cortical low-density as seen on axial image 12 of the head CT, which raises the possibility of acute or subacute extension in that region. No mass effect or hemorrhage. [MT]  1414 Patient will be taken by private vehicle with her [facility] MRI of the brain.  I do think she is stable and reasonably safe for travel by private vehicle, as they are driving directly to the emergency room.  She will need an MRI of the brain to evaluate for posterior  occipital infarct, given the questionable findings on CT and her constellation of symptoms.  She reports she does have claustrophobia and would likely require a dose of IV Versed immediately prior to her scan, which has been ordered.  If this work-up is unremarkable, I anticipate she could likely be discharged home with neurology follow-up for complex migraine.  She did not want any migraine medications in the ED. [MT]    Clinical Course User Index [MT] Yuan Gann, Kermit BaloMatthew J, MD                           Medical Decision Making Amount and/or Complexity of Data Reviewed Labs: ordered. Radiology: ordered. ECG/medicine tests: ordered.  Risk Prescription drug management.  This patient presents to the ED with concern for headaches, dizziness, lightheadedness/ This involves an extensive number of treatment options, and is a complaint that carries with it a high risk of complications and morbidity.  The differential diagnosis includes complex migraine versus cerebellar infarct versus arrhythmia versus dehydration versus anemia versus other  Additional history obtained from patient's husband at the bedside  External records from outside source obtained and reviewed including MRI of the brain and MRA of the neck performed in 2022 demonstrating acute occipital infarct  I ordered and personally interpreted labs.  The pertinent results include: Unremarkable troponin, no acute anemia.  Troponin unremarkable  I ordered imaging studies including CTA of the head and neck I independently visualized and interpreted imaging which showed no critical stenosis or vascular injury.  Radiologist is questioning a possible extension of prior infarct in the posterior occipital region.  The patient was maintained on a cardiac monitor.  I personally viewed and interpreted the cardiac monitored which showed an underlying rhythm of: Sinus rhythm  Per my interpretation the patient's ECG shows normal sinus rhythm without  acute ischemic findings  Patient was offered medication for possible migraine but did not want any medication at this time, reporting her symptoms are extremely mild.  She had no active visual deficits.  She has no temporal artery tenderness or history of autoimmune diseases suspect giant cell arteritis.  On my reassessment the patient has no worsening symptoms, no recurring or worsening headache.  Dispostion:  After consideration of the diagnostic results and the patients response to treatment, I feel that the patent would benefit from transfer to [facility] for MRI of the brain.  If there are acute findings on MRI, she would likely benefit from admission and neurology consultation.         Final Clinical Impression(s) / ED Diagnoses Final diagnoses:  Dizziness    Rx / DC Orders ED Discharge Orders     None         Terald Sleeper, MD 10/29/21 [phone]

## 2021-10-29 NOTE — ED Notes (Signed)
Report to Yolanda Gibbs charge at [facility] ER ?

## 2021-10-29 NOTE — ED Provider Notes (Cosign Needed)
[name] EMERGENCY DEPARTMENT Provider Note   CSN: [id number] Arrival date & time: 10/29/21  1024     History Chief Complaint  Patient presents with   Dizziness    Yolanda Gibbs is a 71 y.o. female w/ hx of occipital stroke, complex migraines presenting to 3-4 weeks of dizziness with prolonged movement.  Patient is also struggling with congestion, and runny nose for the last few weeks.  Patient reports that sitting there and moving her head does not make the dizziness start.  Has reports that usually takes her walking a certain distance before the dizziness starts.  Patient was seen at [facility] and transferred here to have her MRI performed.  Patient describes episodes of lightheaded and sometimes with the room spinning to happen intermittently.  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  Patient currently takes aspirin but came off Plavixs.  CTA at outside hospital showed Radiologist is questioning a possible extension of prior infarct in the posterior occipital region, therefore she was needed to come here to have an MRI performed..  On chart review, patient had a scheduled stroke in 2022 that was diagnosed on MRI of her brain.    Dizziness  Past Medical History Past Medical History:  Diagnosis Date   Allergy    seasonal   Depression    GERD (gastroesophageal reflux disease)    History of chicken pox    History of colon polyps    benign   Hypertension    TIA (transient ischemic attack)    Patient Active Problem List   Diagnosis Date Noted   PFO (patent foramen ovale) 03/09/2021   Essential hypertension 01/01/2021   GERD (gastroesophageal reflux disease) 01/01/2021   Cerebrovascular accident (CVA) (HCC) 01/01/2021   CVA (cerebral vascular accident) (HCC) 12/31/2020   Hot flashes 12/08/2015   Laryngopharyngeal reflux (LPR) 05/24/2015    Hyperlipidemia 10/27/2014   Preventative health care 10/26/2014   History of colonic polyps 08/29/2014   Depression 08/29/2014   Allergic rhinitis 09/21/2007   ESOPHAGEAL REFLUX 09/21/2007   COUGH 09/21/2007   Home Medication(s) Prior to Admission medications   Medication Sig Start Date End Date Taking? Authorizing Provider  aspirin EC 81 MG EC tablet Take 1 tablet (81 mg total) by mouth daily. Swallow whole. 01/03/21   Tyrone Nine, MD  atorvastatin (LIPITOR) 40 MG tablet Take 1 tablet (40 mg total) by mouth daily. 01/03/21   Tyrone Nine, MD  azelastine (ASTELIN) 0.1 % nasal spray Place 2 sprays into both nostrils 2 (two) times daily. Use in each nostril as directed Patient taking differently: Place 2 sprays into both nostrils 2 (two) times daily as needed for rhinitis or allergies. Use in each nostril as directed 03/18/17   Saguier, Ramon Dredge, PA-C  Calcium-Phosphorus-Vitamin D (CITRACAL +D3 PO) Take 1 tablet by mouth daily. Vit d 500 IU, calcium  each    [provider]  Carboxymethylcellulose Sod PF 0.5 % SOLN Place 1 drop into both eyes in the morning and at bedtime.    [provider]  clopidogrel (PLAVIX) 75 MG tablet TAKE ONE TABLET BY MOUTH DAILY 08/24/21   Tonny Bollman, MD  Docusate Sodium 100 MG capsule Take 200 mg by mouth at bedtime.    [provider]  ergocalciferol (VITAMIN D2) 1.25 MG (50000 UT) capsule Take 50,000 Units by mouth every Tuesday. Patient not taking: Reported  on 10/05/2021    [provider]  metoCLOPramide (REGLAN) 5 MG tablet Take 5 mg by mouth at bedtime. Patient not taking: Reported on 10/05/2021    [provider]  Multiple Vitamins-Minerals (MULTIVITAMIN ADULTS 50+) TABS Take 1 tablet by mouth daily.    [provider]  pantoprazole (PROTONIX) 40 MG tablet Take 1 tablet (40 mg total) by mouth 2 (two) times daily. 04/18/21   Janetta Hora, PA-C  Tiotropium Bromide Monohydrate (SPIRIVA RESPIMAT)  2.5 MCG/ACT AERS Inhale 2 puffs into the lungs daily.    [provider]  venlafaxine XR (EFFEXOR-XR) 75 MG 24 hr capsule TAKE 1 CAPSULE BY MOUTH DAILY WITH BREAKFAST Patient taking differently: Take 75 mg by mouth daily with breakfast. 07/08/18   Sandford Craze, NP                                                                                                                                    Past Surgical History Past Surgical History:  Procedure Laterality Date   BUBBLE STUDY  01/02/2021   Procedure: BUBBLE STUDY;  Surgeon: [name], MD;  Location: [facility] ENDOSCOPY;  Service: Cardiovascular;;   LOOP RECORDER INSERTION N/A 01/02/2021   Procedure: LOOP RECORDER INSERTION;  Surgeon: Thurmon Fair, MD;  Location: [facility] INVASIVE CV LAB;  Service: Cardiovascular;  Laterality: N/A;   PATENT FORAMEN OVALE(PFO) CLOSURE N/A 03/09/2021   Procedure: PATENT FORAMEN OVALE (PFO) CLOSURE;  Surgeon: Tonny Bollman, MD;  Location: [facility] INVASIVE CV LAB;  Service: Cardiovascular;  Laterality: N/A;   TEE WITHOUT CARDIOVERSION N/A 01/02/2021   Procedure: TRANSESOPHAGEAL ECHOCARDIOGRAM (TEE);  Surgeon: [name], MD;  Location: [facility] ENDOSCOPY;  Service: Cardiovascular;  Laterality: N/A;   Family History Family History  Problem Relation Age of Onset   Arthritis Mother    CAD Mother    AAA (abdominal aortic aneurysm) Mother    Cancer Mother        lung   Stroke Father    Cancer Maternal Aunt        breast   Arthritis Maternal Grandmother    Cancer Maternal Grandfather        prostate   Depression Paternal Grandmother     Social History Social History   Tobacco Use   Smoking status: Never   Smokeless tobacco: Never  Vaping Use   Vaping Use: Never used  Substance Use Topics   Alcohol use: Yes    Alcohol/week: 3.0 - 4.0 standard drinks    Types: 3 - 4 Standard drinks or equivalent per week    Comment: occasional   Drug use: No   Allergies Darvon [propoxyphene]  and Duloxetine  Review of Systems Review of Systems  Neurological:  Positive for dizziness.    Physical Exam Vital Signs  I have reviewed the triage vital signs BP (!) 143/84    Pulse 79    Temp 98.4 F (36.9 C) (Oral)  Resp 18    Ht  (1.626 m)    Wt 69.4 kg    LMP 08/19/2004    SpO2 96%    BMI 26.26 kg/m  Physical Exam Vitals and nursing note reviewed.  Constitutional:      Appearance: She is normal weight.  HENT:     Head: Normocephalic and atraumatic.  Neurological:     Mental Status: She is alert.  MENTAL STATUS EXAM:    Orientation: Alert and oriented to person, place and time.  Memory: Cooperative, follows commands well.  Language: Speech is clear and language is normal.   CRANIAL NERVES:    CN 2 (Optic): Visual fields intact to confrontation.  CN 3,4,6 (EOM): Pupils equal and reactive to light. Full extraocular eye movement without nystagmus.  CN 5 (Trigeminal): Facial sensation is normal, no weakness of masticatory muscles.  CN 7 (Facial): No facial weakness or asymmetry.  CN 8 (Auditory): Auditory acuity grossly normal.  CN 9,10 (Glossophar): The uvula is midline, the palate elevates symmetrically.  CN 11 (spinal access): Normal sternocleidomastoid and trapezius strength.  CN 12 (Hypoglossal): The tongue is midline. No atrophy or fasciculations.Marland Kitchen   MOTOR:  Muscle Strength: 5/5 RUE, 5/5 LUE, 5/5 RLE, 5/5 LLE  REFLEXES: No clonus.   COORDINATION:   Intact finger-to-nose, tremor noted with intention bilaterally. no pronator drift.   SENSATION:   Intact to light touch all four extremities.    ED Results and Treatments Labs (all labs ordered are listed, but only abnormal results are displayed) Labs Reviewed  BASIC METABOLIC PANEL - Abnormal; Notable for the following components:      Result Value   Sodium 134 (*)    All other components within normal limits  RESP PANEL BY RT-PCR (FLU A&B, COVID) ARPGX2  CBC WITH DIFFERENTIAL/PLATELET  MAGNESIUM   TROPONIN I (HIGH SENSITIVITY)                                                                                                                          Radiology CT ANGIO HEAD NECK W WO CM  Result Date: 10/29/2021 CLINICAL DATA:  Monocular vision loss. History of prior right occipital stroke. EXAM: CT ANGIOGRAPHY HEAD AND NECK TECHNIQUE: Multidetector CT imaging of the head and neck was performed using the standard protocol during bolus administration of intravenous contrast. Multiplanar CT image reconstructions and MIPs were obtained to evaluate the vascular anatomy. Carotid stenosis measurements (when applicable) are obtained utilizing NASCET criteria, using the distal internal carotid diameter as the denominator. RADIATION DOSE REDUCTION: This exam was performed according to the departmental dose-optimization program which includes automated exposure control, adjustment of the mA and/or kV according to patient size and/or use of iterative reconstruction technique. CONTRAST:  OMNIPAQUE IOHEXOL 350 MG/ML SOLN COMPARISON:  MRI 01/01/2021 FINDINGS: CT HEAD FINDINGS Brain: Brainstem and cerebellum are normal. There is an old small right occipital cortical and subcortical infarction as seen on the prior MRI. One could question a small  area low-density adjacent to that old infarction that could represent an acute extension. The brain otherwise has a normal appearance. No evidence of other stroke, mass, hemorrhage, hydrocephalus or extra-axial collection. Vascular: No abnormal vascular finding. 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 Sinuses: Clear Orbits: Normal Review of the MIP images confirms the above findings CTA NECK FINDINGS Aortic arch: Aortic atherosclerosis, minimal. Branching pattern is normal. Right carotid system: Common carotid artery widely patent to the bifurcation. Carotid bifurcation is normal without soft or calcified plaque. Cervical ICA is normal. Left carotid system:  Common carotid artery widely patent to the bifurcation. Carotid bifurcation is normal without soft or calcified plaque. Cervical ICA is normal. Vertebral arteries: No proximal subclavian stenosis. Both vertebral artery origins are widely patent. The left vertebral artery is dominant. Both vertebral arteries are widely patent through the cervical region to the foramen magnum. Skeleton: Ordinary cervical spondylosis and facet arthritis. Other neck: No mass or lymphadenopathy. Upper chest: Normal Review of the MIP images confirms the above findings CTA HEAD FINDINGS Anterior circulation: Both internal carotid arteries are patent through the skull base and siphon region. The anterior and middle cerebral vessels are patent. Both anterior cerebral arteries receive there supply from the left carotid circulation. No large vessel occlusion. No proximal stenosis or aneurysm. Posterior circulation: Both vertebral arteries are patent through the foramen magnum to the basilar. No basilar stenosis. Superior cerebellar and posterior cerebral arteries are patent. Specifically, no sign of right PCA occlusion. Venous sinuses: Patent and normal. Anatomic variants: None significant. Review of the MIP images confirms the above findings IMPRESSION: Normal vascular examination. Mild aortic atherosclerotic calcification. No stenotic or occlusive disease seen within the anterior or posterior circulation either in the neck or in the intracranial segments. Old right occipital cortical and subcortical infarction. One could question mild adjacent cortical low-density as seen on axial image 12 of the head CT, which raises the possibility of acute or subacute extension in that region. No mass effect or hemorrhage. Electronically Signed   By: Paulina FusiMark  Shogry M.D.   On: 10/29/2021 13:53   MR BRAIN WO CONTRAST  Result Date: 10/29/2021 CLINICAL DATA:  Stroke suspected, vertigo, lightheadedness, posterior headache EXAM: MRI HEAD WITHOUT CONTRAST  TECHNIQUE: Multiplanar, multiecho pulse sequences of the brain and surrounding structures were obtained without intravenous contrast. COMPARISON:  01/01/2021 MRI, correlation is made with 10/29/2021 CTA head neck FINDINGS: Brain: No restricted diffusion to suggest acute or subacute infarct. No acute hemorrhage, mass, mass effect, or midline shift. Small area of encephalomalacia in the right occipital lobe, sequela of the acute infarct seen on 01/01/2021. Additional small area of cortical increased T2 signal in the medial right parietal lobe, also likely sequela of small remote infarct. Hydrocephalus or extra-axial collection. Minimal T2 hyperintense signal in the periventricular white matter, likely the sequela of chronic small vessel ischemic disease. Vascular: Normal flow voids. Skull and upper cervical spine: Normal marrow signal. Degenerative changes at C3-C4. Sinuses/Orbits: Negative. Other: The mastoids are well aerated. IMPRESSION: No acute intracranial process. No etiology seen for the patient's symptoms. Electronically Signed   By: Wiliam KeAlison  Vasan M.D.   On: 10/29/2021 19:47    Pertinent labs & imaging results that were available during my care of the patient were reviewed by me and considered in my medical decision making (see MDM for details).  Medications Ordered in ED Medications  midazolam (VERSED) injection 2 mg (has no administration in time range)  iohexol (OMNIPAQUE) 350 MG/ML injection 100 mL (100 mLs  Intravenous Contrast Given 10/29/21 1326)  LORazepam (ATIVAN) injection 1 mg (1 mg Intravenous Given 10/29/21 1807)  meclizine (ANTIVERT) tablet 12.5 mg (12.5 mg Oral Given 10/29/21 2015)                                                                                                                                     Procedures Procedures  (including critical care time)  Medical Decision Making / ED Course   This patient presents to the ED for concern of posterior stroke and central  cause of vertigo, this involves an extensive number of treatment options, and is a complaint that carries with it a high risk of complications and morbidity.  The differential diagnosis includes central vertigo secondary to stroke, BPPV, intracranial abnormality, laboratory or electrolyte abnormality.  MDM: Yolanda Gibbs is a 71 y.o. female w/ hx of occipital stroke, complex migraines presenting to 3-4 weeks of dizziness with prolonged movement.    Nonfocal neuro exam.  Patient able to move her head in all directions without any triggering of her vertigo.  Patient is unable to produce her to symptoms at this time.  MRI obtained and negative for any acute findings.  Patient plans to follow-up with ENT for further evaluation of her vertigo and congestion.  Strict return precautions given.  Husband and patient verbalized agreement understanding plan.   Additional history obtained: -Additional history obtained from Husband -External records from outside source obtained and reviewed including: Chart review including previous notes, labs, imaging, consultation notes   Lab Tests: -I ordered, reviewed, and interpreted labs.   The pertinent results include:   Labs Reviewed  BASIC METABOLIC PANEL - Abnormal; Notable for the following components:      Result Value   Sodium 134 (*)    All other components within normal limits  RESP PANEL BY RT-PCR (FLU A&B, COVID) ARPGX2  CBC WITH DIFFERENTIAL/PLATELET  MAGNESIUM  TROPONIN I (HIGH SENSITIVITY)      EKG   EKG Interpretation  Date/Time:  Monday October 29 2021 11:15:08 EDT Ventricular Rate:  76 PR Interval:  177 QRS Duration: 96 QT Interval:  395 QTC Calculation: 445 R Axis:   62 Text Interpretation: Sinus rhythm Confirmed by Alvester Chou [phone]) on 10/29/2021 11:25:20 AM         Imaging Studies ordered: I ordered imaging studies including MRI brain I independently visualized and interpreted imaging. I agree with the radiologist  interpretation   Medicines ordered and prescription drug management: Meds ordered this encounter  Medications   iohexol (OMNIPAQUE) 350 MG/ML injection 100 mL   midazolam (VERSED) injection 2 mg   LORazepam (ATIVAN) injection 1 mg   meclizine (ANTIVERT) tablet 12.5 mg    -I have reviewed the patients home medicines and have made adjustments as needed  Cardiac Monitoring: The patient was maintained on a cardiac monitor.  I personally viewed and interpreted the cardiac monitored which showed an  underlying rhythm of: NSR  Social Determinants of Health:  Factors impacting patients care include: None   Reevaluation: After the interventions noted above, I reevaluated the patient and found that they have :stayed the same Symptoms were not present on arrival.  Co morbidities that complicate the patient evaluation  Past Medical History:  Diagnosis Date   Allergy    seasonal   Depression    GERD (gastroesophageal reflux disease)    History of chicken pox    History of colon polyps    benign   Hypertension    TIA (transient ischemic attack)       Dispostion: I considered admission for this patient, however, given negative MRI no indication at this time due to no stroke being noted.     Final Clinical Impression(s) / ED Diagnoses Final diagnoses:  Dizziness     Micheline Maze, MD 10/30/21 0001

## 2021-10-30 ENCOUNTER — Telehealth (HOSPITAL_COMMUNITY): Payer: Self-pay | Admitting: Emergency Medicine

## 2021-10-30 NOTE — Telephone Encounter (Signed)
Received a phone call from the patient who was seen in the emergency department yesterday.  She is requesting we place an electronic referral to ENT.  She was advised to follow-up with Dr. Ezzard Standing of ENT.  Electronic referral order placed. ?

## 2021-11-12 ENCOUNTER — Ambulatory Visit (INDEPENDENT_AMBULATORY_CARE_PROVIDER_SITE_OTHER): Payer: Medicare Other

## 2021-11-12 DIAGNOSIS — I639 Cerebral infarction, unspecified: Secondary | ICD-10-CM

## 2021-11-13 LAB — CUP PACEART REMOTE DEVICE CHECK
Date Time Interrogation Session: 20230326230530
Implantable Pulse Generator Implant Date: 20220517

## 2021-11-22 ENCOUNTER — Encounter: Payer: Self-pay | Admitting: Adult Health

## 2021-11-22 ENCOUNTER — Telehealth: Payer: Self-pay | Admitting: Adult Health

## 2021-11-22 ENCOUNTER — Ambulatory Visit: Payer: Medicare Other | Admitting: Adult Health

## 2021-11-22 VITALS — BP 125/76 | HR 82 | Ht 65.0 in | Wt 158.0 lb

## 2021-11-22 DIAGNOSIS — R251 Tremor, unspecified: Secondary | ICD-10-CM | POA: Diagnosis not present

## 2021-11-22 DIAGNOSIS — I639 Cerebral infarction, unspecified: Secondary | ICD-10-CM

## 2021-11-22 DIAGNOSIS — H8110 Benign paroxysmal vertigo, unspecified ear: Secondary | ICD-10-CM

## 2021-11-22 DIAGNOSIS — Z9189 Other specified personal risk factors, not elsewhere classified: Secondary | ICD-10-CM

## 2021-11-22 DIAGNOSIS — G43109 Migraine with aura, not intractable, without status migrainosus: Secondary | ICD-10-CM

## 2021-11-22 NOTE — Progress Notes (Signed)
Carelink Summary Report / Loop Recorder 

## 2021-11-22 NOTE — Telephone Encounter (Addendum)
Referral sent to [facility] [phone]. ?

## 2021-11-22 NOTE — Patient Instructions (Addendum)
Continue to monitor your tremors - this could be from your prior stroke or possibly a benign essential tremor ? ?Referral placed to [facility] for possible underlying sleep apnea ? ?Referral placed to start vestibular therapy for BPPV - you will be called to schedule ? ?Continue to monitor headaches - if these should worsen or you would like to try a preventative medication, please let me know ? ?Continue aspirin 81 mg daily  and atorvastatin for secondary stroke prevention ? ?Continue to follow up with PCP regarding atorvastatin and blood pressure management  ?Maintain strict control of hypertension with blood pressure goal below 130/90 and cholesterol with LDL cholesterol (bad cholesterol) goal below 70 mg/dL.  ? ?Signs of a Stroke? Follow the BEFAST method:  ?Balance Watch for a sudden loss of balance, trouble with coordination or vertigo ?Eyes Is there a sudden loss of vision in one or both eyes? Or double vision?  ?Face: Ask the person to smile. Does one side of the face droop or is it numb?  ?Arms: Ask the person to raise both arms. Does one arm drift downward? Is there weakness or numbness of a leg? ?Speech: Ask the person to repeat a simple phrase. Does the speech sound slurred/strange? Is the person confused ? ?Time: If you observe any of these signs, call 911. ? ? ? ? ? ?Followup in the future with me in 4 months or call earlier if needed ? ? ? ? ? ? ?Thank you for coming to see [place] at [facility]. I hope we have been able to provide you high quality care today. ? ?You may receive a patient satisfaction survey over the next few weeks. We would appreciate your feedback and comments so that we may continue to improve ourselves and the health of our patients. ? ? ? ? ?Benign Positional Vertigo ?Vertigo is the feeling that you or your surroundings are moving when they are not. Benign positional vertigo is the most common form of vertigo. This is usually a harmless condition (benign).  This condition is positional. This means that symptoms are triggered by certain movements and positions. ?This condition can be dangerous if it occurs while you are doing something that could cause harm to yourself or others. This includes activities such as driving or operating machinery. ?What are the causes? ?The inner ear has fluid-filled canals that help your brain sense movement and balance. When the fluid moves, the brain receives messages about your body's position. ?With benign positional vertigo, calcium crystals in the inner ear break free and disturb the inner ear area. This causes your brain to receive confusing messages about your body's position. ?What increases the risk? ?You are more likely to develop this condition if: ?You are a woman. ?You are 69 years of age or older. ?You have recently had a head injury. ?You have an inner ear disease. ?What are the signs or symptoms? ?Symptoms of this condition usually happen when you move your head or your eyes in different directions. Symptoms may start suddenly and usually last for less than a minute. They include: ?Loss of balance and falling. ?Feeling like you are spinning or moving. ?Feeling like your surroundings are spinning or moving. ?Nausea and vomiting. ?Blurred vision. ?Dizziness. ?Involuntary eye movement (nystagmus). ?Symptoms can be mild and cause only minor problems, or they can be severe and interfere with daily life. Episodes of benign positional vertigo may return (recur) over time. Symptoms may also improve over time. ?How is this diagnosed? ?  This condition may be diagnosed based on: ?Your medical history. ?A physical exam of the head, neck, and ears. ?Positional tests to check for or stimulate vertigo. You may be asked to turn your head and change positions, such as going from sitting to lying down. A health care provider will watch for symptoms of vertigo. ?You may be referred to a health care provider who specializes in ear, nose, and  throat problems (ENT or otolaryngologist) or a provider who specializes in disorders of the nervous system (neurologist). ?How is this treated? ?This condition may be treated in a session in which your health care provider moves your head in specific positions to help the displaced crystals in your inner ear move. Treatment for this condition may take several sessions. Surgery may be needed in severe cases, but this is rare. ?In some cases, benign positional vertigo may resolve on its own in 2-4 weeks. ?Follow these instructions at home: ?Safety ?Move slowly. Avoid sudden body or head movements or certain positions, as told by your health care provider. ?Avoid driving or operating machinery until your health care provider says it is safe. ?Avoid doing any tasks that would be dangerous to you or others if vertigo occurs. ?If you have trouble walking or keeping your balance, try using a cane for stability. If you feel dizzy or unstable, sit down right away. ?Return to your normal activities as told by your health care provider. Ask your health care provider what activities are safe for you. ?General instructions ?Take over-the-counter and prescription medicines only as told by your health care provider. ?Drink enough fluid to keep your urine pale yellow. ?Keep all follow-up visits. This is important. ?Contact a health care provider if: ?You have a fever. ?Your condition gets worse or you develop new symptoms. ?Your family or friends notice any behavioral changes. ?You have nausea or vomiting that gets worse. ?You have numbness or a prickling and tingling sensation. ?Get help right away if you: ?Have difficulty speaking or moving. ?Are always dizzy or faint. ?Develop severe headaches. ?Have weakness in your legs or arms. ?Have changes in your hearing or vision. ?Develop a stiff neck. ?Develop sensitivity to light. ?These symptoms may represent a serious problem that is an emergency. Do not wait to see if the symptoms  will go away. Get medical help right away. Call your local emergency services (911 in the [place] Do not drive yourself to the hospital. ?Summary ?Vertigo is the feeling that you or your surroundings are moving when they are not. Benign positional vertigo is the most common form of vertigo. ?This condition is caused by calcium crystals in the inner ear that become displaced. This causes a disturbance in an area of the inner ear that helps your brain sense movement and balance. ?Symptoms include loss of balance and falling, feeling that you or your surroundings are moving, nausea and vomiting, and blurred vision. ?This condition can be diagnosed based on symptoms, a physical exam, and positional tests. ?Follow safety instructions as told by your health care provider and keep all follow-up visits. This is important. ?This information is not intended to replace advice given to you by your health care provider. Make sure you discuss any questions you have with your health care provider. ?Document Revised: 07/05/2020 Document Reviewed: 07/05/2020 ?Elsevier Patient Education ? 2022 [employer] ? ?

## 2021-11-22 NOTE — Progress Notes (Signed)
?[facility] ?[id number] ?[place]. [place] [id number] ?[phone] ? ?     STROKE FOLLOW UP NOTE ? ?[name] ?Date of Birth:  23-Apr-1951 ?Medical Record Number:  [id number]  ? ?Reason for Referral: stroke follow up ? ? ? ?SUBJECTIVE: ? ? ?CHIEF COMPLAINT:  ?Chief Complaint  ?Patient presents with  ? Follow-up  ?  Rm 3 with spouse Yolanda Gibbs ?Pt is well and stable, no new stroke concerns   ? ? ? ?HPI:  ? ?Update 11/22/2021 JM: Patient returns for 5531-month stroke follow-up accompanied by her husband.  Overall stable from stroke standpoint. Reports continued visual concerns, same as prior visit.  Was recently seen in ED on 3/13 for 3-4 wk onset of dizziness and worsening headaches, MR brain negative, noted exam consistent with peripheral vertigo and advised outpatient ENT follow-up.  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  Information for Epley maneuver provided, declined vestibular therapy. She was doing epley maneuver routinely at home but has not been doing them as routinely over the past week and has noticed worsening of vertigo - she questions if she is doing them correctly.  Continues to experience almost daily migraine headaches since onset of vertigo, typically triggered by hunger and feeling overheated.  Will take Tylenol and eat and will usually help.  She has never been treated for migraine headaches but does have history of migraines.  She also notes left > right arm shaking since her stroke and has been progressively worsening, typically worse in the morning and gets better throughout the day.  Does not interfere with activity.  Denies any family history of tremors or Parkinson's.  Does endorse daytime fatigue, at times insomnia, snores, occasional morning headaches and memory loss. Has not previously underwent sleep study - declined further evaluation at prior visits.  ? ?Compliant on aspirin and atorvastatin, denies side effects.  Blood pressure today 125/76.   Loop recorder has not shown atrial fibrillation thus far.  No further concerns at this time. ? ? ? ? ?History provided for reference purposes only ?Update 05/23/2021 JM: Overall stable.  Continued left peripheral vision impairment - stable. Followed by ophthalmology (prior visit in Aug - has f/u 11/23).  Reports worsening blurred vision close up and issues with floaters (not a new issue).  Prior complaints of mild frontal headache with some improvement still after looking at a computer for prolonged period of time or with hunger. Continued fatigue with activity - some improvement since prior visit. Continues to maintain all ADLs and IADLs independently as well as driving without difficulty.  Denies new stroke/TIA symptoms. ? ?S/p successful PFO closure 03/09/2021 with Dr. Excell Seltzerooper.  Currently maintaining aspirin and Plavix tolerating without side effects for total of 531-month duration.  Compliant on atorvastatin without associated side effects.  Blood pressure today 146/92. Has not been recently monitored at home as previously stable.  Loop recorder has not shown atrial fibrillation thus far. PCP recently completed lab work which was satisfactory (unable to view via epic).  No further concerns at this time. ? ? ?Initial visit 02/14/2021 JM: [name] is being seen for hospital follow-up unaccompanied.  She has been doing well since discharge without new stroke/TIA symptoms.  She does report residual left peripheral field blurred vision as well as fatigue and some short-term memory loss since her stroke.  She has returned back to all prior activities including driving and working.  She will occasionally experience a frontal headache after prolonged  eyestrain and looking at the computer during work but will typically resolve on own or with Tylenol.  She has not experienced any additional migraines or visual auras.  She did have mild memory loss prior to her stroke but more likely age-related.  She has noticed herself  fatiguing quicker with normal activities throughout the day -she did not have any fatigue concerns previously.  Completed 3 weeks DAPT and remains on aspirin alone as well as atorvastatin without associated side effects.  Blood pressure today 131/82.  Loop recorder has not shown atrial fibrillation thus far. Per reivew of epic, Dr. Excell Seltzer further reviewed TEE with definite PFO with color and verbal confirmation moderate size and has scheduled evaluation 7/6 to further discuss possible closure.  No further concerns at this time. ?  ? ?Stroke admission 12/31/2020 ?Yolanda Gibbs is a 71 y.o. female with a history of hypertension and migraine who presented on 12/31/2020 with 1 week history of visual changes describing and zigzag lines more prominent in the left.  Personally reviewed hospitalization pertinent progress notes, lab work and imaging with summary provided.  She initially was seen by her optometrist with no abnormality noted.  Was seen by PCP due to elevated BP no started on losartan but symptoms did not improve.  She eventually sought care at [facility] for CT head showed subacute occipital stroke and transferred to [facility].  Evaluated by Dr. Roda Shutters with right occipital stroke likely due to migraine vs cardioembolic source.  TEE showed small PFO evaluated by cardiology Dr. Excell Seltzer for possible closure although low suspicion stroke etiology given size and age.  Placement of loop recorder for possible atrial fibrillation and stroke etiology.  Recommended DAPT for 3 weeks and aspirin HTN stable.  LDL 89 and started atorvastatin 40 mg daily.  Other stroke risk factors include advanced age, current EtOH use (3-4 drinks/week), family history of stroke and ocular migraines.  Residual subjective left visual field blurred vision (unable to appreciate visual field deficit on exam).  Evaluated by therapies and discharged home in stable condition without therapy needs. ? ?Stroke - right occipital small stroke likely due  to migraine versus cardioembolic source. ?CT - Focal area of decreased attenuation in the posterior aspect of the mid right occipital lobe, felt to represent a recent and potential acute focal infarct in this area ?MRI: right occipital small infarct ?MRA head and neck: negative ?2D Echo: EF 60-65%, mild LVH ?LE venous Doppler negative for DVT ?VTE prophylaxis - SCDs, Lovenox ?TEE: Positive bubble study but negative with color doppler, consistent with small PFO ?Loop recorder implant (01/02/2021) ?A1c 6.2 ?LDL 89 ?On DAPT with plavix and ASA x 3 weeks then ASA alone. ?Therapy recommendations: Cleared for home by PT/OT with no needs ?Disposition:  Home  ? ? ? ? ? ? ? ?ROS:   ?14 system review of systems performed and negative with exception of those listed in HPI ? ?PMH:  ?Past Medical History:  ?Diagnosis Date  ? Allergy   ? seasonal  ? Depression   ? GERD (gastroesophageal reflux disease)   ? History of chicken pox   ? History of colon polyps   ? benign  ? Hypertension   ? TIA (transient ischemic attack)   ? ? ?PSH:  ?Past Surgical History:  ?Procedure Laterality Date  ? BUBBLE STUDY  01/02/2021  ? Procedure: BUBBLE STUDY;  Surgeon: [name], MD;  Location: [facility] ENDOSCOPY;  Service: Cardiovascular;;  ? LOOP RECORDER INSERTION N/A 01/02/2021  ?  Procedure: LOOP RECORDER INSERTION;  Surgeon: Thurmon Fair, MD;  Location: [facility] INVASIVE CV LAB;  Service: Cardiovascular;  Laterality: N/A;  ? PATENT FORAMEN OVALE(PFO) CLOSURE N/A 03/09/2021  ? Procedure: PATENT FORAMEN OVALE (PFO) CLOSURE;  Surgeon: Tonny Bollman, MD;  Location: [facility] INVASIVE CV LAB;  Service: Cardiovascular;  Laterality: N/A;  ? TEE WITHOUT CARDIOVERSION N/A 01/02/2021  ? Procedure: TRANSESOPHAGEAL ECHOCARDIOGRAM (TEE);  Surgeon: [name], MD;  Location: [facility] ENDOSCOPY;  Service: Cardiovascular;  Laterality: N/A;  ? ? ?Social History:  ?Social History  ? ?Socioeconomic History  ? Marital status: Married  ?  Spouse name: Not on file  ?  Number of children: Not on file  ? Years of education: Not on file  ? Highest education level: Not on file  ?Occupational History  ? Not on file  ?Tobacco Use  ? Smoking status: Never  ? Smokeless tobacc

## 2021-12-15 LAB — CUP PACEART REMOTE DEVICE CHECK
Date Time Interrogation Session: 20230428230126
Implantable Pulse Generator Implant Date: 20220517

## 2021-12-17 ENCOUNTER — Ambulatory Visit (INDEPENDENT_AMBULATORY_CARE_PROVIDER_SITE_OTHER): Payer: Self-pay

## 2021-12-17 DIAGNOSIS — I639 Cerebral infarction, unspecified: Secondary | ICD-10-CM

## 2022-01-01 NOTE — Progress Notes (Signed)
Carelink Summary Report / Loop Recorder 

## 2022-01-09 ENCOUNTER — Institutional Professional Consult (permissible substitution): Payer: Medicare Other | Admitting: Neurology

## 2022-01-10 ENCOUNTER — Other Ambulatory Visit: Payer: Self-pay

## 2022-01-10 DIAGNOSIS — Q2112 Patent foramen ovale: Secondary | ICD-10-CM

## 2022-01-10 DIAGNOSIS — Z8774 Personal history of (corrected) congenital malformations of heart and circulatory system: Secondary | ICD-10-CM

## 2022-01-10 DIAGNOSIS — I639 Cerebral infarction, unspecified: Secondary | ICD-10-CM

## 2022-01-21 ENCOUNTER — Ambulatory Visit (INDEPENDENT_AMBULATORY_CARE_PROVIDER_SITE_OTHER): Payer: Self-pay

## 2022-01-21 DIAGNOSIS — I639 Cerebral infarction, unspecified: Secondary | ICD-10-CM

## 2022-01-21 LAB — CUP PACEART REMOTE DEVICE CHECK
Date Time Interrogation Session: 20230531230820
Implantable Pulse Generator Implant Date: 20220517

## 2022-02-06 NOTE — Progress Notes (Signed)
Carelink Summary Report / Loop Recorder 

## 2022-02-07 ENCOUNTER — Ambulatory Visit (INDEPENDENT_AMBULATORY_CARE_PROVIDER_SITE_OTHER): Payer: Medicare Other | Admitting: Neurology

## 2022-02-07 ENCOUNTER — Encounter: Payer: Self-pay | Admitting: Neurology

## 2022-02-07 VITALS — BP 133/72 | HR 86 | Ht 65.0 in | Wt 163.0 lb

## 2022-02-07 DIAGNOSIS — G4719 Other hypersomnia: Secondary | ICD-10-CM | POA: Diagnosis not present

## 2022-02-07 DIAGNOSIS — R0683 Snoring: Secondary | ICD-10-CM | POA: Diagnosis not present

## 2022-02-07 DIAGNOSIS — I639 Cerebral infarction, unspecified: Secondary | ICD-10-CM | POA: Diagnosis not present

## 2022-02-07 DIAGNOSIS — R519 Headache, unspecified: Secondary | ICD-10-CM

## 2022-02-07 DIAGNOSIS — R0681 Apnea, not elsewhere classified: Secondary | ICD-10-CM | POA: Diagnosis not present

## 2022-02-07 DIAGNOSIS — E663 Overweight: Secondary | ICD-10-CM

## 2022-02-07 DIAGNOSIS — Z820 Family history of epilepsy and other diseases of the nervous system: Secondary | ICD-10-CM

## 2022-02-07 NOTE — Patient Instructions (Signed)

## 2022-02-07 NOTE — Progress Notes (Signed)
Subjective:    Patient ID: Yolanda Gibbs is a 71 y.o. female.  HPI    Huston Foley, MD, PhD [facility] [address], [address] [address] [place], [place] [address]  Dear Shanda Bumps and Janalyn Shy,   I saw your patient, Yolanda Gibbs, upon your kind request, in my Sleep clinic today for initial consultation of her sleep disorder, in particular, concern for underlying obstructive sleep apnea.  The patient is unaccompanied today.  As you know, Yolanda Gibbs is a 71 year old right-handed woman with an underlying medical history of TIA, stroke, depression, allergies, hypertension, reflux disease, vertigo, migraine headaches, tremor, and mildly overweight state, who reports snoring and excessive daytime somnolence, as well as witnessed apneas per husband's report.  I reviewed your office note from 11/22/2021.  Her Epworth sleepiness score is 13 out of 24, fatigue severity score is 23 out of 63.  She reports that her sister has a CPAP machine.  Patient has a history of bruxism and has a dentist made occlusive guard.  She has occasional morning headaches, she does not have any night to night nocturia.  She generally goes to bed between 8 PM and midnight and rise time is generally between 5:30 AM and 7:30 AM.  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  She has gained about 20 pounds since her stroke.  She lives with her husband.  She has 2 grown daughters, no pets in the household, she does have a TV in her bedroom but turns it off before falling asleep.  She drinks caffeine in the form of soda, 12 ounce serving per day on average, no daily coffee.  She is a non-smoker and drinks alcohol about 4 to 5 ounces wine every day with dinner.    Her Past Medical History Is Significant For: Past Medical History:  Diagnosis Date   Allergy    seasonal   Depression    GERD (gastroesophageal reflux disease)    History of chicken pox    History of colon polyps    benign    Hypertension    TIA (transient ischemic attack)     Her Past Surgical History Is Significant For: Past Surgical History:  Procedure Laterality Date   BUBBLE STUDY  01/02/2021   Procedure: BUBBLE STUDY;  Surgeon: [name], MD;  Location: [facility] ENDOSCOPY;  Service: Cardiovascular;;   LOOP RECORDER INSERTION N/A 01/02/2021   Procedure: LOOP RECORDER INSERTION;  Surgeon: Thurmon Fair, MD;  Location: [facility] INVASIVE CV LAB;  Service: Cardiovascular;  Laterality: N/A;   PATENT FORAMEN OVALE(PFO) CLOSURE N/A 03/09/2021   Procedure: PATENT FORAMEN OVALE (PFO) CLOSURE;  Surgeon: Tonny Bollman, MD;  Location: [facility] INVASIVE CV LAB;  Service: Cardiovascular;  Laterality: N/A;   TEE WITHOUT CARDIOVERSION N/A 01/02/2021   Procedure: TRANSESOPHAGEAL ECHOCARDIOGRAM (TEE);  Surgeon: [name], MD;  Location: [facility] ENDOSCOPY;  Service: Cardiovascular;  Laterality: N/A;    Her Family History Is Significant For: Family History  Problem Relation Age of Onset   Arthritis Mother    CAD Mother    AAA (abdominal aortic aneurysm) Mother    Cancer Mother        lung   Stroke Father    Cancer Maternal Aunt        breast   Arthritis Maternal Grandmother    Cancer Maternal Grandfather        prostate   Depression Paternal Grandmother     Her Social History  Is Significant For: Social History   Socioeconomic History   Marital status: Married    Spouse name: Not on file   Number of children: Not on file   Years of education: Not on file   Highest education level: Not on file  Occupational History   Not on file  Tobacco Use   Smoking status: Never   Smokeless tobacco: Never  Vaping Use   Vaping Use: Never used  Substance and Sexual Activity   Alcohol use: Yes    Alcohol/week: 3.0 - 4.0 standard drinks of alcohol    Types: 3 - 4 Standard drinks or equivalent per week    Comment: occasional   Drug use: No   Sexual activity: Not on file  Other Topics Concern   Not on file   Social History Narrative   Daughter at Haroldine Laws (Julian Hy) born 52   Daughter Erin 35   Husband is a Curator, has a Forensic scientist, has a Landscape architect helps with day to day of the business   Second marriage- prior to remarrying worked in Passenger transport manager.   Completed college   Enjoys watching old movies, needlework, cleaning her house   No pets.    Caffiene occasional 12 oz Dr Reino Kent,  coffee on weekends.   Social Determinants of Health   Financial Resource Strain: Not on file  Food Insecurity: Not on file  Transportation Needs: Not on file  Physical Activity: Not on file  Stress: Not on file  Social Connections: Not on file    Her Allergies Are:  Allergies  Allergen Reactions   Darvon [Propoxyphene] Other (See Comments)    "Slept for 3 days"   Duloxetine     [facility]- patient is unsure   :   Her Current Medications Are:  Outpatient Encounter Medications as of 02/07/2022  Medication Sig   aspirin EC 81 MG EC tablet Take 1 tablet (81 mg total) by mouth daily. Swallow whole.   atorvastatin (LIPITOR) 40 MG tablet Take 1 tablet (40 mg total) by mouth daily.   azelastine (ASTELIN) 0.1 % nasal spray Place 2 sprays into both nostrils 2 (two) times daily. Use in each nostril as directed (Patient taking differently: Place 2 sprays into both nostrils 2 (two) times daily as needed for rhinitis or allergies. Use in each nostril as directed)   Calcium-Phosphorus-Vitamin D (CITRACAL +D3 PO) Take 1 tablet by mouth daily. Vit d 500 IU, calcium 630mg  each   Carboxymethylcellulose Sod PF 0.5 % SOLN Place 1 drop into both eyes in the morning and at bedtime.   Docusate Sodium 100 MG capsule Take 200 mg by mouth at bedtime.   ergocalciferol (VITAMIN D2) 1.25 MG (50000 UT) capsule Take 50,000 Units by mouth every Tuesday.   metoCLOPramide (REGLAN) 5 MG tablet Take 5 mg by mouth at bedtime.   Multiple Vitamins-Minerals (MULTIVITAMIN ADULTS 50+) TABS Take 1 tablet by mouth  daily.   pantoprazole (PROTONIX) 40 MG tablet Take 1 tablet (40 mg total) by mouth 2 (two) times daily.   Tiotropium Bromide Monohydrate (SPIRIVA RESPIMAT) 2.5 MCG/ACT AERS Inhale 2 puffs into the lungs daily.   venlafaxine XR (EFFEXOR-XR) 75 MG 24 hr capsule TAKE 1 CAPSULE BY MOUTH DAILY WITH BREAKFAST (Patient taking differently: Take 75 mg by mouth daily with breakfast.)   No facility-administered encounter medications on file as of 02/07/2022.  :   Review of Systems:  Out of a complete 14 point review of systems, all are  reviewed and negative with the exception of these symptoms as listed below:  Review of Systems  Neurological:        Endorse daytime fatigue, at times insomnia, snores, occasional morning headaches and memory loss.  No prior sleep study.  ESS 13, FSS 23.    Objective:  Neurological Exam  Physical Exam Physical Examination:   Vitals:   02/07/22 1351  BP: 133/72  Pulse: 86    General Examination: The patient is a very pleasant 71 y.o. female in no acute distress. She appears well-developed and well-nourished and well groomed.   HEENT: Normocephalic, atraumatic, pupils are equal, round and reactive to light, extraocular tracking is good without limitation to gaze excursion or nystagmus noted. Hearing is grossly intact. Face is symmetric with normal facial animation. Speech is clear with no dysarthria noted.  She has intermittent lip smacking and involuntary tongue movements, very slight, she does not seem to be aware of them.  Of note, she takes Reglan every day.  She has been on it for the past 3 years.  Neck is supple with full range of passive and active motion. There are no carotid bruits on auscultation. Oropharynx exam reveals: moderate mouth dryness, adequate dental hygiene and mild ai airway crowding secondary to tonsillar size of about 1-2+ redundant soft palate.  Neck circumference 14 inches, minimal overbite noted.  Tongue protrudes centrally and palate  elevates symmetrically.   Chest: Clear to auscultation without wheezing, rhonchi or crackles noted.  Heart: S1+S2+0, regular and normal without murmurs, rubs or gallops noted.   Abdomen: Soft, non-tender and non-distended with normal bowel sounds appreciated on auscultation.  Extremities: There is no pitting edema in the distal lower extremities bilaterally.   Skin: Warm and dry without trophic changes noted.   Musculoskeletal: exam reveals no obvious joint deformities.   Neurologically:  Mental status: The patient is awake, alert and oriented in all 4 spheres. Her immediate and remote memory, attention, language skills and fund of knowledge are appropriate. There is no evidence of aphasia, agnosia, apraxia or anomia. Speech is clear with normal prosody and enunciation. Thought process is linear. Mood is normal and affect is normal.  Cranial nerves II - XII are as described above under HEENT exam.  Motor exam: Normal bulk, strength and tone is noted. There is no obvious resting tremor.  She reports an intermittent hand tremor.  Again, she has been on Reglan for 3 years.  Fine motor skills and coordination: grossly intact.  Cerebellar testing: No dysmetria or intention tremor. There is no truncal or gait ataxia.  Sensory exam: intact to light touch in the upper and lower extremities.  Gait, station and balance: She stands easily. No veering to one side is noted. No leaning to one side is noted. Posture is age-appropriate and stance is narrow based. Gait shows normal stride length and normal pace. No problems turning are noted.   Assessment and Plan:  In summary, Yolanda Gibbs is a very pleasant 71 y.o.-year old female with an underlying medical history of TIA, stroke, depression, allergies, hypertension, reflux disease, vertigo, migraine headaches, tremor, and mildly overweight state, whose history and physical exam are concerning for obstructive sleep apnea (OSA). I had a long chat with  the patient about my findings and the diagnosis of OSA, its prognosis and treatment options. We talked about medical treatments, surgical interventions and non-pharmacological approaches. I explained in particular the risks and ramifications of untreated moderate to severe OSA, especially with respect to developing  cardiovascular disease down the Road, including congestive heart failure, difficult to treat hypertension, cardiac arrhythmias, or stroke. Even type 2 diabetes has, in part, been linked to untreated OSA. Symptoms of untreated OSA include daytime sleepiness, memory problems, mood irritability and mood disorder such as depression and anxiety, lack of energy, as well as recurrent headaches, especially morning headaches. We talked about trying to maintain a healthy lifestyle in general, as well as the importance of weight control. We also talked about the importance of good sleep hygiene. I recommended the following at this time: sleep study.  I outlined the differences between a laboratory attended sleep study versus home sleep testing. I explained the sleep test procedure to the patient and also outlined possible surgical and non-surgical treatment options of OSA, including the use of a custom-made dental device (which would require a referral to a specialist dentist or oral surgeon), upper airway surgical options, such as traditional UPPP or a novel less invasive surgical option in the form of Inspire hypoglossal nerve stimulation (which would involve a referral to an ENT surgeon). I also explained the CPAP treatment option to the patient, who indicated that she would be willing to try PAP if the need arises. I explained the importance of being compliant with PAP treatment, not only for insurance purposes but primarily to improve Her symptoms, and for the patient's long term health benefit, including to reduce Her cardiovascular risks.  We will pick up our discussion after sleep testing and keep her  posted as to her test results by phone call.  We will plan a follow-up in sleep clinic accordingly.  She is encouraged to talk to her GI prescriber about the ongoing need for Reglan every day.  I answered all her questions today and she was in agreement with our plan.  She is advised to follow-up with you as scheduled/planned.   Thank you very much for allowing me to participate in the care of this nice patient. If I can be of any further assistance to you please do not hesitate to talk to me.   Sincerely,   Huston Foley, MD, PhD

## 2022-02-13 ENCOUNTER — Telehealth: Payer: Self-pay | Admitting: Neurology

## 2022-02-13 NOTE — Telephone Encounter (Signed)
[employer]- [employer] medicare no auth req spoke to [place] C ref # [id number]. Patient is scheduled at [facility] for 02/26/22 at 9 pm.

## 2022-02-24 LAB — CUP PACEART REMOTE DEVICE CHECK
Date Time Interrogation Session: 20230703230857
Implantable Pulse Generator Implant Date: 20220517

## 2022-02-25 ENCOUNTER — Ambulatory Visit (INDEPENDENT_AMBULATORY_CARE_PROVIDER_SITE_OTHER): Payer: Self-pay

## 2022-02-25 DIAGNOSIS — I639 Cerebral infarction, unspecified: Secondary | ICD-10-CM

## 2022-02-26 ENCOUNTER — Ambulatory Visit (INDEPENDENT_AMBULATORY_CARE_PROVIDER_SITE_OTHER): Payer: Medicare Other | Admitting: Neurology

## 2022-02-26 DIAGNOSIS — G4719 Other hypersomnia: Secondary | ICD-10-CM

## 2022-02-26 DIAGNOSIS — R0681 Apnea, not elsewhere classified: Secondary | ICD-10-CM

## 2022-02-26 DIAGNOSIS — I639 Cerebral infarction, unspecified: Secondary | ICD-10-CM

## 2022-02-26 DIAGNOSIS — Z820 Family history of epilepsy and other diseases of the nervous system: Secondary | ICD-10-CM

## 2022-02-26 DIAGNOSIS — G4739 Other sleep apnea: Secondary | ICD-10-CM

## 2022-02-26 DIAGNOSIS — G4720 Circadian rhythm sleep disorder, unspecified type: Secondary | ICD-10-CM

## 2022-02-26 DIAGNOSIS — E663 Overweight: Secondary | ICD-10-CM

## 2022-02-26 DIAGNOSIS — G4733 Obstructive sleep apnea (adult) (pediatric): Secondary | ICD-10-CM | POA: Diagnosis not present

## 2022-02-26 DIAGNOSIS — R0683 Snoring: Secondary | ICD-10-CM

## 2022-02-26 DIAGNOSIS — R519 Headache, unspecified: Secondary | ICD-10-CM

## 2022-03-04 NOTE — Procedures (Signed)
No note

## 2022-03-04 NOTE — Addendum Note (Signed)
Addended by: Huston Foley on: 03/04/2022 06:20 PM   Modules accepted: Orders

## 2022-03-04 NOTE — Procedures (Signed)
PATIENT'S NAME:  Yolanda Gibbs, Yolanda Gibbs DOB:      [birth year]      MR#:    [id number]     DATE OF RECORDING: 02/26/2022 REFERRING M.D.:  Frann Rider, NP Study Performed:  Split-Night Titration Study HISTORY: 71 year old right-handed woman with an underlying medical history of TIA, stroke, depression, allergies, hypertension, reflux disease, vertigo, migraine headaches, tremor, and mildly overweight state, who reports snoring and excessive daytime somnolence, as well as witnessed apneas. The patient endorsed the Epworth Sleepiness Scale at 13 points. The patient's weight 163 pounds with a height of 65 (inches), resulting in a BMI of 27.2 kg/m2. The patient's neck circumference measured 14 inches.  CURRENT MEDICATIONS: Aspirin EC, Lipitor, Astelin, Citracal +D3, Carboxymethylcellulose Fod, Docusate Sodium, Vitamin D2, Reglan, Multivitamin, Protonix, Spiriva Respimat, Effexor XR  PROCEDURE:  This is a multichannel digital polysomnogram utilizing the Somnostar 11.2 system.  Electrodes and sensors were applied and monitored per AASM Specifications.   EEG, EOG, Chin and Limb EMG, were sampled at 200 Hz.  ECG, Snore and Nasal Pressure, Thermal Airflow, Respiratory Effort, CPAP Flow and Pressure, Oximetry was sampled at 50 Hz. Digital video and audio were recorded.      BASELINE STUDY WITHOUT CPAP RESULTS:  Lights Out was at 21:41 and Lights On at 05:00 for the night. The patient met emergency split criteria, due to a high AHI of 52.7/hour. PAP start was at 00:34, epoch 353. Total recording time (TRT) was 173, with a total sleep time (TST) of 127.5 minutes.   The patient's sleep latency was 46 minutes.  REM latency was 0 minutes.  The sleep efficiency was 73.7 %.    SLEEP ARCHITECTURE: WASO (Wake after sleep onset) was 5.5 minutes, Stage N1 was 7.5 minutes, Stage N2 was 120 minutes, Stage N3 was 0 minutes and Stage R (REM sleep) was 0 minutes.  The percentages were Stage N1 5.9%, Stage N2 94.1%, which is highly  increased, Stage N3 and Stage R (REM sleep) were absent. The arousals were noted as: 13 were spontaneous, 0 were associated with PLMs, 23 were associated with respiratory events.  RESPIRATORY ANALYSIS:  There were a total of 112 respiratory events:  103 obstructive apneas, 0 central apneas and 0 mixed apneas with a total of 103 apneas and an apnea index (AI) of 48.5. There were 9 hypopneas with a hypopnea index of 4.2. The patient also had 0 respiratory event related arousals (RERAs).  Snoring was noted.     The total APNEA/HYPOPNEA INDEX (AHI) was 52.7 /hour and the total RESPIRATORY DISTURBANCE INDEX was 52.7 /hour.  0 events occurred in REM sleep and 18 events in NREM. The REM AHI was 0, /hour versus a non-REM AHI of 52.7 /hour. The patient spent 363.5 minutes sleep time in the supine position 0 minutes in non-supine. The supine AHI was 52.7 /hour versus a non-supine AHI of 0.0 /hour.  OXYGEN SATURATION & C02:  The wake baseline 02 saturation was 96%, with the lowest being 81%. Time spent below 89% saturation equaled 62 minutes.  ERIODIC LIMB MOVEMENTS: The patient had a total of 0 Periodic Limb Movements.  The Periodic Limb Movement (PLM) index was 0 /hour and the PLM Arousal index was 0 /hour.  Audio and video analysis did not show any abnormal or unusual movements, behaviors, phonations or vocalizations. The patient took 1 bathroom break for the night. Mild to moderate snoring was noted, the EKG was in keeping with normal sinus rhythm (NSR).   TITRATION STUDY WITH CPAP  RESULTS:   The patient was tried on different interfaces and did well with the medium N20 nasal mask from [employer]. CPAP was initiated at 6 cmH20 with heated humidity per AASM split night standards and pressure was advanced to 6 cmH20 because of hypopneas, apneas and desaturations.  At a PAP pressure of 6 cmH20, there was a reduction of the AHI to 2.5/hour (with 5/8 events central in nature) with supine REM sleep achieved and O2  nadir of 88%.   Total recording time (TRT) was 267 minutes, with a total sleep time (TST) of 236 minutes. The patient's sleep latency was 49.5 minutes. REM latency was 90 minutes.  The sleep efficiency was 88.4 %.    SLEEP ARCHITECTURE: Wake after sleep was 1.5 minutes, Stage N1 7 minutes, Stage N2 86 minutes, Stage N3 35 minutes and Stage R (REM sleep) 108 minutes. The percentages were: Stage N1 3.%, Stage N2 36.4%, Stage N3 14.8% and Stage R (REM sleep) 45.8%, which is markedly increased, and in keeping with rebound. The arousals were noted as: 44 were spontaneous, 0 were associated with PLMs, 0 were associated with respiratory events.  RESPIRATORY ANALYSIS:  There were a total of 10 respiratory events: 1 obstructive apneas, 5 central apneas and 0 mixed apneas with a total of 6 apneas and an apnea index (AI) of 1.5. There were 4 hypopneas with a hypopnea index of 1. /hour. The patient also had 0 respiratory event related arousals (RERAs).      The total APNEA/HYPOPNEA INDEX  (AHI) was 2.5 /hour and the total RESPIRATORY DISTURBANCE INDEX was 2.5 /hour.  9 events occurred in REM sleep and 1 events in NREM. The REM AHI was 5 /hour versus a non-REM AHI of .5 /hour. REM sleep was achieved on a pressure of  cm/h2o (AHI was  .) The patient spent 100% of total sleep time in the supine position. The supine AHI was 2.5 /hour, versus a non-supine AHI of 0.0/hour.  OXYGEN SATURATION & C02:  The wake baseline 02 saturation was 94%, with the lowest being 88%. Time spent below 89% saturation equaled 0 minutes.  PERIODIC LIMB MOVEMENTS:    The patient had a total of 0 Periodic Limb Movements. The Periodic Limb Movement (PLM) index was 0 /hour and the PLM Arousal index was 0 /hour.  Post-study, the patient indicated that sleep was the same as usual.  POLYSOMNOGRAPHY IMPRESSION :   Severe Obstructive Sleep Apnea (OSA)  Treatment emergent Central Apneas   Dysfunctions associated with sleep stages or arousals  from sleep  RECOMMENDATIONS:  This patient severe obstructive sleep apnea and met emergency split criteria, due to a baseline AHI of 52.7/hour and O2 nadir of 81%. The absence of REM sleep during the baseline portion of the study likely underestimated her sleep-disordered breathing. The patient responded well to CPAP therapy at a pressure of 6 cm with some central apneas noted. I will recommend a home CPAP treatment pressure of 6 cm via medium nasal mask with heated humidity. The patient willl be advised to be fully compliant with PAP therapy to improve sleep related symptoms and decrease long term cardiovascular risks. Please note that untreated obstructive sleep apnea may carry additional perioperative morbidity. Patients with significant obstructive sleep apnea should receive perioperative PAP therapy and the surgeons and particularly the anesthesiologist should be informed of the diagnosis and the severity of the sleep disordered breathing. This study shows sleep fragmentation and abnormal sleep stage percentages; these are nonspecific findings and per se  do not signify an intrinsic sleep disorder or a cause for the patient's sleep-related symptoms. Causes include (but are not limited to) the first night effect of the sleep study, circadian rhythm disturbances, medication effect or an underlying mood disorder or medical problem.  The patient should be cautioned not to drive, work at heights, or operate dangerous or heavy equipment when tired or sleepy. Review and reiteration of good sleep hygiene measures should be pursued with any patient. The patient will be seen in follow-up in the sleep clinic at [facility] for discussion of the test results, symptom and treatment compliance review, further management strategies, etc. The referring provider will be notified of the test results.  I certify that I have reviewed the entire raw data recording prior to the issuance of this report in accordance with the  Standards of Accreditation of the American Academy of Sleep Medicine (AASM)  Star Age, MD, PhD Diplomat, American Board of Neurology and Sleep Medicine ( Neurology and Sleep Medicine)

## 2022-03-05 ENCOUNTER — Telehealth: Payer: Self-pay

## 2022-03-05 NOTE — Telephone Encounter (Signed)
-----   Message from Huston Foley, MD sent at 03/04/2022  6:20 PM EDT ----- Patient referred by Shanda Bumps, seen by me on 02/07/22, patient had a split night sleep study on 02/26/22. Please call and notify patient that the recent sleep study showed severe obstructive sleep apnea (OSA). She did well with CPAP during the study with significant improvement of the respiratory events. I would like start the patient on a CPAP machine for home use. I placed the order in the chart.  Please advise patient that we will need a follow up appointment with either myself or one of our nurse practitioners in about 2-3 months post set-up to check for how they are doing on treatment and how well it's going with the machine in general. Most insurance company require a certain compliance percentage to continue to cover/pay for the machine. Please ask patient to schedule this FU appointment, according to the set-up date, which is the day they receive the machine. Please make sure, the patient understands the importance of keeping this window for the FU appointment, as it is often an Barista and not our rule. Failing to adhere to this may result in losing coverage for sleep apnea treatment, at which point some insurances require repeating the whole process. Plus, monitoring compliance data is usually good feedback for the patient as far as how they are doing, how many hours they are on it, how well the mask fits, etc.  Also remind patient, that any PAP machine or mask issues should be first addressed with the DME company, who provided the machine/supplies.  Please ask if patient has a preference regarding DME company, may depend on the insurance too.  Huston Foley, MD, PhD [facility])

## 2022-03-05 NOTE — Telephone Encounter (Signed)
I called pt. I advised pt that Dr. Frances Furbish reviewed their sleep study results and found that pt did well with cpap during his latest sleep study. Dr. Frances Furbish recommends that pt start a cpap at home. I reviewed PAP compliance expectations with the pt. Pt is agreeable to starting a CPAP. I advised pt that an order will be sent to a DME, [facility], and [facility] will call the pt within about one week after they file with the pt's insurance. [facility] will show the pt how to use the machine, fit for masks, and troubleshoot the CPAP if needed. A follow up appt was made for insurance purposes with Shanda Bumps, NP on 06/12/22 at 3:45pm. Pt verbalized understanding to arrive 15 minutes early and bring their CPAP. A letter with all of this information in it will be mailed to the pt as a reminder. I verified with the pt that the address we have on file is correct. Pt verbalized understanding of results. Pt had no questions at this time but was encouraged to call back if questions arise. I have sent the order to [facility] and have received confirmation that they have received the order.

## 2022-03-13 ENCOUNTER — Ambulatory Visit: Payer: Medicare Other

## 2022-03-13 ENCOUNTER — Ambulatory Visit (HOSPITAL_COMMUNITY): Payer: Medicare Other | Attending: Cardiology

## 2022-03-13 DIAGNOSIS — I639 Cerebral infarction, unspecified: Secondary | ICD-10-CM

## 2022-03-13 DIAGNOSIS — Q2112 Patent foramen ovale: Secondary | ICD-10-CM | POA: Diagnosis not present

## 2022-03-13 DIAGNOSIS — Z8774 Personal history of (corrected) congenital malformations of heart and circulatory system: Secondary | ICD-10-CM | POA: Diagnosis not present

## 2022-03-13 LAB — ECHOCARDIOGRAM LIMITED BUBBLE STUDY
Area-P 1/2: 3.91 cm2
S' Lateral: 1.8 cm

## 2022-03-13 MED ORDER — PERFLUTREN LIPID MICROSPHERE
1.0000 mL | INTRAVENOUS | Status: AC | PRN
  Administered 2022-03-13: 2 mL via INTRAVENOUS

## 2022-03-15 DIAGNOSIS — G4733 Obstructive sleep apnea (adult) (pediatric): Secondary | ICD-10-CM | POA: Diagnosis not present

## 2022-03-18 NOTE — Progress Notes (Unsigned)
Virtual Visit via Telephone Note   Because of Yolanda Gibbs's co-morbid illnesses, she is at least at moderate risk for complications without adequate follow up.  This format is felt to be most appropriate for this patient at this time.  The patient did not have access to video technology/had technical difficulties with video requiring transitioning to audio format only (telephone).  All issues noted in this document were discussed and addressed.  No physical exam could be performed with this format.  Please refer to the patient's chart for her consent to telehealth for [facility].    Date:  03/20/2022   ID:  Yolanda Gibbs, DOB August 25, 1950, MRN [id number] The patient was identified using 2 identifiers.  Patient Location: Home Provider Location: Office/Clinic   PCP:  Yolanda Beam, MD   [facility] Providers Cardiologist: Dr. Excell Seltzer, MD (PFO closure){  Evaluation Performed:  Follow-Up Visit  Chief Complaint:  1 year s/p PFO closure   History of Present Illness:    Yolanda Gibbs is a 71 y.o. female with a hx of HTN, cryptogenic CVA, and PFO s/p percutaneous PFO closure (03/09/21) who is being seen virtually today for one year follow up s/p PFO closure.   [name] reports that last year ago she woke up on Mother's Day with headache and visual changes. She has a hx of migraines but she did not develop a headache with this. Symptoms resolved temporarily but she then experienced blurry vision. She went for evaluation and was noted to have 'very high blood pressure.' She was started on medication for BP and 1 week later she ultimately went to the ER for evaluation of headache and blurry vision.  She was found to have an acute infarct in the mid right occipital lobe with no associated mass or hemorrhage. This finding was confirmed on brain MRI. The patient underwent extensive hospital evaluation.  CT and MRI studies of the brain again demonstrated a right  occipital stroke.  Labs showed a normal CBC and metabolic panel.  LDL cholesterol was 89.  2D echocardiogram showed normal LV function, normal RV function, mild aortic valve sclerosis, and no other significant abnormalities. TEE confirmed preserved LV systolic function with LVEF 60 to 65%, normal RV function, no evidence of thrombus in the left atrial appendage, no significant valvular disease, and the presence of a PFO is confirmed with a positive agitated saline study demonstrating bubbles in the left atrium within 3-6 cardiac cycles.  A loop recorder was inserted during this admission which has not shown any afib. Other than her recent diagnosis of hypertension, she had no risk factors for stroke. She has no history of hyperlipidemia, atherosclerotic disease, tobacco use, or diabetes.   She was referred to Dr. Excell Gibbs and underwent successful transcatheter PFO closure with a 16 mm Amplatzer atrial septal occluder device on 03/09/21. Post op echo showed normal functioning device with no atrial level shunting.   In follow up, she was doing well with no complaints. Follow up echocardiogram with bubble completed 7/26 which showed no shunting and stable device placement.   Today she states that she continues to do well with no new neuro changes. Her BP is labile which she is working on with her PCP. She is tolerating medications with no side effects. She will continue ASA indefinitely. No reports of bleeding in stool or urine. She denies chest pain, SOB, palpitations, LE edema, dizziness, or syncope.    Past Medical History:  Diagnosis Date  Allergy    seasonal   Depression    GERD (gastroesophageal reflux disease)    History of chicken pox    History of colon polyps    benign   Hypertension    TIA (transient ischemic attack)    Past Surgical History:  Procedure Laterality Date   BUBBLE STUDY  01/02/2021   Procedure: BUBBLE STUDY;  Surgeon: [name], MD;  Location: [facility] ENDOSCOPY;   Service: Cardiovascular;;   LOOP RECORDER INSERTION N/A 01/02/2021   Procedure: LOOP RECORDER INSERTION;  Surgeon: Thurmon Fair, MD;  Location: [facility] INVASIVE CV LAB;  Service: Cardiovascular;  Laterality: N/A;   PATENT FORAMEN OVALE(PFO) CLOSURE N/A 03/09/2021   Procedure: PATENT FORAMEN OVALE (PFO) CLOSURE;  Surgeon: Tonny Bollman, MD;  Location: [facility] INVASIVE CV LAB;  Service: Cardiovascular;  Laterality: N/A;   TEE WITHOUT CARDIOVERSION N/A 01/02/2021   Procedure: TRANSESOPHAGEAL ECHOCARDIOGRAM (TEE);  Surgeon: [name], MD;  Location: [facility] ENDOSCOPY;  Service: Cardiovascular;  Laterality: N/A;     Current Meds  Medication Sig   aspirin EC 81 MG EC tablet Take 1 tablet (81 mg total) by mouth daily. Swallow whole.   atorvastatin (LIPITOR) 40 MG tablet Take 1 tablet (40 mg total) by mouth daily.   azelastine (ASTELIN) 0.1 % nasal spray Place 2 sprays into both nostrils 2 (two) times daily. Use in each nostril as directed   Carboxymethylcellulose Sod PF 0.5 % SOLN Place 1 drop into both eyes in the morning and at bedtime.   Docusate Sodium 100 MG capsule Take 200 mg by mouth at bedtime.   Multiple Vitamins-Minerals (MULTIVITAMIN ADULTS 50+) TABS Take 1 tablet by mouth daily.   venlafaxine XR (EFFEXOR-XR) 75 MG 24 hr capsule TAKE 1 CAPSULE BY MOUTH DAILY WITH BREAKFAST     Allergies:   Darvon [propoxyphene] and Duloxetine   Social History   Tobacco Use   Smoking status: Never   Smokeless tobacco: Never  Vaping Use   Vaping Use: Never used  Substance Use Topics   Alcohol use: Yes    Alcohol/week: 3.0 - 4.0 standard drinks of alcohol    Types: 3 - 4 Standard drinks or equivalent per week    Comment: occasional   Drug use: No     Family Hx: The patient's family history includes AAA (abdominal aortic aneurysm) in her mother; Arthritis in her maternal grandmother and mother; CAD in her mother; Cancer in her maternal aunt, maternal grandfather, and mother; Depression in her  paternal grandmother; Stroke in her father.  ROS:   Please see the history of present illness.     All other systems reviewed and are negative.  Prior CV studies:   The following studies were reviewed today:  Echocardiogram with bubble 03/13/22:   1. Left ventricular ejection fraction, by estimation, is 65 to 70%. The  left ventricle has normal function. The left ventricle has no regional  wall motion abnormalities. There is mild asymmetric left ventricular  hypertrophy of the basal-septal segment.  Left ventricular diastolic parameters are consistent with Grade I  diastolic dysfunction (impaired relaxation).   2. Right ventricular systolic function is normal. The right ventricular  size is normal. Tricuspid regurgitation signal is inadequate for assessing  PA pressure.   3. The mitral valve is normal in structure. No evidence of mitral valve  regurgitation. No evidence of mitral stenosis.   4. The aortic valve is tricuspid. Aortic valve regurgitation is not  visualized. No aortic stenosis is present.   5.  The inferior vena cava is normal in size with greater than 50%  respiratory variability, suggesting right atrial pressure of 3 mmHg.   6. S/p Amplatzer atrial septal occluder. No residual shunt seen. Agitated  saline contrast bubble study was negative, with no evidence of any  interatrial shunt.   PFO closure 03/09/21:  Successful transcatheter PFO closure with a 16 mm Amplatzer atrial septal occluder device using intracardiac echo and fluoroscopic guidance.   Labs/Other Tests and Data Reviewed:    EKG:  No ECG reviewed. Loop Recorder downloads reviewed.   Recent Labs: 10/29/2021: BUN 21; Creatinine, Ser 0.72; Hemoglobin 13.8; Magnesium 2.0; Platelets 337; Potassium 4.0; Sodium 134   Recent Lipid Panel Lab Results  Component Value Date/Time   CHOL 190 01/01/2021 12:15 AM   TRIG 84 01/01/2021 12:15 AM   HDL 84 01/01/2021 12:15 AM   CHOLHDL 2.3 01/01/2021 12:15 AM    LDLCALC 89 01/01/2021 12:15 AM   Wt Readings from Last 3 Encounters:  03/20/22 162 lb (73.5 kg)  02/07/22 163 lb (73.9 kg)  11/22/21 158 lb (71.7 kg)        Objective:    Vital Signs:  Ht 5\' 5"  (1.651 m)   Wt 162 lb (73.5 kg)   LMP 08/19/2004   BMI 26.96 kg/m    VITAL SIGNS:  reviewed GEN:  no acute distress NEURO:  alert and oriented x 3, no obvious focal deficit  ASSESSMENT & PLAN:    PFO s/p percutaneous PFO closure: No complaints today. Post implant echocardiogram performed 03/13/22 with no shunting and stable device placement. She stopped Plavix at 6 months with plans to continue ASA indefinitely. Tolerating this well with no issues. No need for SBE any longer. This was discussed. Follow back PRN. Continue following PCP as previously scheduled.    Cryptogenic CVA: s/p PFO closure. No AF on loop recorder downloads. Continue statin    HTN: Stable. Follows closely with PCP. Continue Losartan 50mg  daily.    Time:   Today, I have spent 10 minutes with the patient with telehealth technology discussing the above problems.     Medication Adjustments/Labs and Tests Ordered: Current medicines are reviewed at length with the patient today.  Concerns regarding medicines are outlined above.   Tests Ordered: No orders of the defined types were placed in this encounter.   Medication Changes: No orders of the defined types were placed in this encounter.  Follow Up:   PRN  prn  Signed, 03/15/22, NP  03/20/2022 10:20 AM    [facility]

## 2022-03-20 ENCOUNTER — Telehealth: Payer: Medicare Other

## 2022-03-20 ENCOUNTER — Ambulatory Visit (INDEPENDENT_AMBULATORY_CARE_PROVIDER_SITE_OTHER): Payer: Medicare Other | Admitting: Cardiology

## 2022-03-20 VITALS — Ht 65.0 in | Wt 162.0 lb

## 2022-03-20 DIAGNOSIS — I639 Cerebral infarction, unspecified: Secondary | ICD-10-CM

## 2022-03-20 DIAGNOSIS — I10 Essential (primary) hypertension: Secondary | ICD-10-CM | POA: Diagnosis not present

## 2022-03-20 DIAGNOSIS — Q2112 Patent foramen ovale: Secondary | ICD-10-CM

## 2022-03-20 DIAGNOSIS — Z8774 Personal history of (corrected) congenital malformations of heart and circulatory system: Secondary | ICD-10-CM | POA: Diagnosis not present

## 2022-03-20 DIAGNOSIS — E785 Hyperlipidemia, unspecified: Secondary | ICD-10-CM

## 2022-03-20 NOTE — Patient Instructions (Signed)
Medication Instructions:  Your physician recommends that you continue on your current medications as directed. Please refer to the Current Medication list given to you today.  *If you need a refill on your cardiac medications before your next appointment, please call your pharmacy*  Lab Work: If you have labs (blood work) drawn today and your tests are completely normal, you will receive your results only by: MyChart Message (if you have MyChart) OR A paper copy in the mail If you have any lab test that is abnormal or we need to change your treatment, we will call you to review the results.  Follow-Up: At [facility], you and your health needs are our priority.  As part of our continuing mission to provide you with exceptional heart care, we have created designated Provider Care Teams.  These Care Teams include your primary Cardiologist (physician) and Advanced Practice Providers (APPs -  Physician Assistants and Nurse Practitioners) who all work together to provide you with the care you need, when you need it.  We recommend signing up for the patient portal called "MyChart".  Sign up information is provided on this After Visit Summary.  MyChart is used to connect with patients for Virtual Visits (Telemedicine).  Patients are able to view lab/test results, encounter notes, upcoming appointments, etc.  Non-urgent messages can be sent to your provider as well.   To learn more about what you can do with MyChart, go to [email or web address].    Your next appointment:   As needed  The format for your next appointment:   In Person  Provider:   Georgie Chard, [facility]  Other Instructions Please follow-up with your primary care doctor.  Important Information About Sugar

## 2022-03-22 DIAGNOSIS — E785 Hyperlipidemia, unspecified: Secondary | ICD-10-CM | POA: Diagnosis not present

## 2022-03-22 DIAGNOSIS — R7303 Prediabetes: Secondary | ICD-10-CM | POA: Diagnosis not present

## 2022-03-22 DIAGNOSIS — I509 Heart failure, unspecified: Secondary | ICD-10-CM | POA: Diagnosis not present

## 2022-03-22 DIAGNOSIS — I10 Essential (primary) hypertension: Secondary | ICD-10-CM | POA: Diagnosis not present

## 2022-03-26 NOTE — Progress Notes (Unsigned)
[facility] [address] [place]. [place] [address] [phone]       STROKE FOLLOW UP NOTE  [name] Date of Birth:  [birth year] Medical Record Number:  [id number]   Reason for Referral: stroke follow up    SUBJECTIVE:   CHIEF COMPLAINT:  No chief complaint on file.    HPI:   Update 03/26/2022 JM: Patient returns for stroke follow-up after prior visit 4 months ago.  Overall stable without new stroke/TIA symptoms.  Reports residual ***.  Compliant on aspirin atorvastatin, denies side effects.  Blood pressure today ***.  Loop recorder has not shown atrial fibrillation thus far.  Evaluated by Dr. Frances Furbish and underwent PSG 7/11 which showed severe OSA with AHI 51/h and initiated AutoPap 7/28.  Has initial CPAP follow-up visit scheduled 10/25.  Prior complaints of migraine headaches ***  Prior complaints of tremor ***  Prior complaints of vertigo ***         History provided for reference purposes only Update 11/22/2021 JM: Patient returns for 95-month stroke follow-up accompanied by her husband.  Overall stable from stroke standpoint. Reports continued visual concerns, same as prior visit.  Was recently seen in ED on 3/13 for 3-4 wk onset of dizziness and worsening headaches, MR brain negative, noted exam consistent with peripheral vertigo and advised outpatient ENT follow-up.  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  Information for Epley maneuver provided, declined vestibular therapy. She was doing epley maneuver routinely at home but has not been doing them as routinely over the past week and has noticed worsening of vertigo - she questions if she is doing them correctly.  Continues to experience almost daily migraine headaches since onset of vertigo, typically triggered by hunger and feeling overheated.  Will take Tylenol and eat and will usually help.  She has never been treated for migraine headaches but does have history of  migraines.  She also notes left > right arm shaking since her stroke and has been progressively worsening, typically worse in the morning and gets better throughout the day.  Does not interfere with activity.  Denies any family history of tremors or Parkinson's.  Does endorse daytime fatigue, at times insomnia, snores, occasional morning headaches and memory loss. Has not previously underwent sleep study - declined further evaluation at prior visits.   Compliant on aspirin and atorvastatin, denies side effects.  Blood pressure today 125/76.  Loop recorder has not shown atrial fibrillation thus far.  No further concerns at this time.   Update 05/23/2021 JM: Overall stable.  Continued left peripheral vision impairment - stable. Followed by ophthalmology (prior visit in Aug - has f/u 11/23).  Reports worsening blurred vision close up and issues with floaters (not a new issue).  Prior complaints of mild frontal headache with some improvement still after looking at a computer for prolonged period of time or with hunger. Continued fatigue with activity - some improvement since prior visit. Continues to maintain all ADLs and IADLs independently as well as driving without difficulty.  Denies new stroke/TIA symptoms.  S/p successful PFO closure 03/09/2021 with Dr. Excell Seltzer.  Currently maintaining aspirin and Plavix tolerating without side effects for total of 23-month duration.  Compliant on atorvastatin without associated side effects.  Blood pressure today 146/92. Has not been recently monitored at home as previously stable.  Loop recorder has not shown atrial fibrillation thus far. PCP recently completed lab work which was satisfactory (unable to view via epic).  No further concerns at this time.   Initial visit 02/14/2021 JM: [name] is being seen for hospital follow-up unaccompanied.  She has been doing well since discharge without new stroke/TIA symptoms.  She does report residual left peripheral field blurred  vision as well as fatigue and some short-term memory loss since her stroke.  She has returned back to all prior activities including driving and working.  She will occasionally experience a frontal headache after prolonged eyestrain and looking at the computer during work but will typically resolve on own or with Tylenol.  She has not experienced any additional migraines or visual auras.  She did have mild memory loss prior to her stroke but more likely age-related.  She has noticed herself fatiguing quicker with normal activities throughout the day -she did not have any fatigue concerns previously.  Completed 3 weeks DAPT and remains on aspirin alone as well as atorvastatin without associated side effects.  Blood pressure today 131/82.  Loop recorder has not shown atrial fibrillation thus far. Per reivew of epic, Dr. Excell Seltzer further reviewed TEE with definite PFO with color and verbal confirmation moderate size and has scheduled evaluation 7/6 to further discuss possible closure.  No further concerns at this time.    Stroke admission 12/31/2020 Yolanda Gibbs is a 71 y.o. female with a history of hypertension and migraine who presented on 12/31/2020 with 1 week history of visual changes describing and zigzag lines more prominent in the left.  Personally reviewed hospitalization pertinent progress notes, lab work and imaging with summary provided.  She initially was seen by her optometrist with no abnormality noted.  Was seen by PCP due to elevated BP no started on losartan but symptoms did not improve.  She eventually sought care at [facility] for CT head showed subacute occipital stroke and transferred to [facility].  Evaluated by Dr. Roda Shutters with right occipital stroke likely due to migraine vs cardioembolic source.  TEE showed small PFO evaluated by cardiology Dr. Excell Seltzer for possible closure although low suspicion stroke etiology given size and age.  Placement of loop recorder for possible atrial fibrillation  and stroke etiology.  Recommended DAPT for 3 weeks and aspirin HTN stable.  LDL 89 and started atorvastatin 40 mg daily.  Other stroke risk factors include advanced age, current EtOH use (3-4 drinks/week), family history of stroke and ocular migraines.  Residual subjective left visual field blurred vision (unable to appreciate visual field deficit on exam).  Evaluated by therapies and discharged home in stable condition without therapy needs.  Stroke - right occipital small stroke likely due to migraine versus cardioembolic source. CT - Focal area of decreased attenuation in the posterior aspect of the mid right occipital lobe, felt to represent a recent and potential acute focal infarct in this area MRI: right occipital small infarct MRA head and neck: negative 2D Echo: EF 60-65%, mild LVH LE venous Doppler negative for DVT VTE prophylaxis - SCDs, Lovenox TEE: Positive bubble study but negative with color doppler, consistent with small PFO Loop recorder implant (01/02/2021) A1c 6.2 LDL 89 On DAPT with plavix and ASA x 3 weeks then ASA alone. Therapy recommendations: Cleared for home by PT/OT with no needs Disposition:  Home         ROS:   14 system review of systems performed and negative with exception of those listed in HPI  PMH:  Past Medical History:  Diagnosis Date   Allergy    seasonal   Depression  GERD (gastroesophageal reflux disease)    History of chicken pox    History of colon polyps    benign   Hypertension    TIA (transient ischemic attack)     PSH:  Past Surgical History:  Procedure Laterality Date   BUBBLE STUDY  01/02/2021   Procedure: BUBBLE STUDY;  Surgeon: Little IshikawaSchumann, Christopher L, MD;  [facility] END[facility]COPY;  Service: Cardiovascular;;   LOOP RECORDER INSERTION N/A 01/02/2021   Procedure: LOOP RECORDER INSERTION;  Surgeon: Thurmon Fairroitoru, Mihai, MD;  Location: [facility] INVASIVE CV LAB;  Service: Cardiovascular;  Laterality: N/A;   PATENT FORAMEN OVALE(PFO)  CLOSURE N/A 03/09/2021   Procedure: PATENT FORAMEN OVALE (PFO) CLOSURE;  Surgeon: Tonny Bollmanooper, Michael, MD;  [facility];  Service: Cardiovascular;  Laterality: N/A;   TEE WITHOUT CARDIOVERSION N/A 01/02/2021   Procedure: TRANSESOPHAGEAL ECHOCARDIOGRAM (TEE);  Surgeon: Little IshikawaSchumann, Christopher L, MD;  [facility];  Service: Cardiovascular;  Laterality: N/A;    Social History:  Social History   Socioeconomic History   Marital status: Married    Spouse name: Not on file   Number of children: Not on file   Years of education: Not on file   Highest education level: Not on file  Occupational History   Not on file  Tobacco Use   Smoking status: Never   Smokeless tobacco: Never  Vaping Use   Vaping Use: Never used  Substance and Sexual Activity   Alcohol use: Yes    Alcohol/week: 3.0 - 4.0 standard drinks of alcohol    Types: 3 - 4 Standard drinks or equivalent per week    Comment: occasional   Drug use: No   Sexual activity: Not on file  Other Topics Concern   Not on file  Social History Narrative   Daughter at [employer] (Julian Hyessa) born [id number]   Daughter Erin [id number]   Husband is a Curatormechanic, has a Forensic scientistnapa store, has a Landscape architectdealership license helps with day to day of the business   Second marriage- prior to 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   Completed college   Enjoys watching old movies, needlework, cleaning her house   No pets.    Caffiene occasional 12 oz Dr Reino KentPepper,  coffee on weekends.   Social Determinants of Health   Financial Resource Strain: Not on file  Food Insecurity: Not on file  Transportation Needs: Not on file  Physical Activity: Not on file  Stress: Not on file  Social Connections: Not on file  Intimate Partner Violence: Not on file    Family History:  Family History  Problem Relation Age of Onset   Arthritis Mother    CAD Mother    AAA (abdominal aortic aneurysm) Mother    Cancer Mother        lung   Stroke Father    Cancer Maternal  Aunt        breast   Arthritis Maternal Grandmother    Cancer Maternal Grandfather        prostate   Depression Paternal Grandmother     Medications:   Current Outpatient Medications on File Prior to Visit  Medication Sig Dispense Refill   aspirin EC 81 MG EC tablet Take 1 tablet (81 mg total) by mouth daily. Swallow whole. 30 tablet 1   atorvastatin (LIPITOR) 40 MG tablet Take 1 tablet (40 mg total) by mouth daily. 30 tablet 1   azelastine (ASTELIN) 0.1 % nasal spray Place 2 sprays into both nostrils 2 (two) times daily.  Use in each nostril as directed 30 mL 12   Calcium-Phosphorus-Vitamin D (CITRACAL +D3 PO) Take 1 tablet by mouth daily. Vit d 500 IU, calcium 630mg  each (Patient not taking: Reported on 03/20/2022)     Carboxymethylcellulose Sod PF 0.5 % SOLN Place 1 drop into both eyes in the morning and at bedtime.     Docusate Sodium 100 MG capsule Take 200 mg by mouth at bedtime.     losartan (COZAAR) 100 MG tablet Take 100 mg by mouth daily.     metoCLOPramide (REGLAN) 5 MG tablet Take 5 mg by mouth at bedtime. (Patient not taking: Reported on 03/20/2022)     Multiple Vitamins-Minerals (MULTIVITAMIN ADULTS 50+) TABS Take 1 tablet by mouth daily.     pantoprazole (PROTONIX) 40 MG tablet Take 1 tablet (40 mg total) by mouth 2 (two) times daily. (Patient not taking: Reported on 03/20/2022) 180 tablet 3   Tiotropium Bromide Monohydrate (SPIRIVA RESPIMAT) 2.5 MCG/ACT AERS Inhale 2 puffs into the lungs daily. (Patient not taking: Reported on 03/20/2022)     venlafaxine XR (EFFEXOR-XR) 75 MG 24 hr capsule TAKE 1 CAPSULE BY MOUTH DAILY WITH BREAKFAST 90 capsule 0   No current facility-administered medications on file prior to visit.    Allergies:   Allergies  Allergen Reactions   Darvon [Propoxyphene] Other (See Comments)    "Slept for 3 days"   Duloxetine     Added from [facility]- patient is unsure       OBJECTIVE:  Physical Exam  There were no vitals filed for this  visit.    There is no height or weight on file to calculate BMI. No results found.  General: well developed, well nourished, very pleasant middle-age Caucasian female, seated, in no evident distress Head: head normocephalic and atraumatic.   Neck: supple with no carotid or supraclavicular bruits Cardiovascular: regular rate and rhythm, no murmurs Musculoskeletal: no deformity Skin:  no rash/petichiae Vascular:  Normal pulses all extremities   Neurologic Exam Mental Status: Awake and fully alert. Fluent speech and language.  Oriented to place and time. Recent and remote memory intact. Attention span, concentration and fund of knowledge appropriate. Mood and affect appropriate.  Cranial Nerves: Pupils equal, briskly reactive to light. Extraocular movements full without nystagmus. Visual fields full to confrontation although subjective bilateral left upper blurred vision.  Hearing intact. Facial sensation intact. Face, tongue, palate moves normally and symmetrically.  Motor: Normal bulk and tone. Normal strength in all tested extremity muscles. Very slight action tremor L>R with outstretched arms.  No evidence of tremors at rest, bradykinesia or cogwheel rigidity Sensory.: intact to touch , pinprick , position and vibratory sensation.  Coordination: Rapid alternating movements normal in all extremities. Finger-to-nose and heel-to-shin showed mild incoordination of left upper and lower extremity Gait and Station: Arises from chair without difficulty. Stance is normal. Gait demonstrates normal stride length and balance without use of assistive device. Tandem walk and heel toe with mild difficulty.  Romberg negative. Reflexes: 1+ and symmetric. Toes downgoing.         ASSESSMENT: Yolanda Gibbs is a 71 y.o. year old female with right occipital stroke embolic secondary to unknown source on 12/31/2020 s/p ILR. Vascular risk factors include HTN, HLD, small PFO, advanced age, ocular migraines  and newly diagnosed severe OSA recently started AutoPap.  Seen in ED 3/13 for 3-4 week onset of dizziness and worsening migraine headaches consistent with peripheral vertigo per ED note - advised to f/u with ENT  who diagnosed her with BPPV.       PLAN:  R occipital stroke, cryptogenic:  Residual deficit: Left peripheral visual impairment.  Routinely followed by ophthalmology.  Does not interfere with daily activity or functioning.  Loop recorder has not shown atrial fibrillation thus far  Continue aspirin 81 mg daily  and atorvastatin 40 mg daily for secondary stroke prevention managed/monitored by PCP.   Discussed secondary stroke prevention measures and importance of close PCP follow up for aggressive stroke risk factor management including BP goal<130/90, and HLD with LDL goal<70  Migraine headaches: Worsened with onset of vertigo. Does have chronic allergies year-round with worsening more recently which may be contributing. MR brain unremarkable.  Declines interest in preventative therapy at this time.  Advised to call if she wishes to pursue in the future Tremor: possibly post stroke as symptom onset with stroke vs possibly benign essential tremor.  Does not interfere with daily activity or functioning.  Continue to monitor. PFO: s/p successful closure 03/09/2021 by Dr. Excell Seltzer. Per cardiology, continue aspirin alone indefinitely as 6 mo DAPT completed and follow-up visit in 1 year (around 03/2022) for repeat echo with bubble Severe OSA: Recently diagnosed on PSG with AHI 51/h.  Started AutoPap 7/28.  Scheduled for initial follow-up visit 10/25. BPPV: Referral placed for vestibular therapy    Follow up in 4 months or call earlier if needed   CC:  PCP: Melvenia Beam, MD    I spent 42 minutes of face-to-face and non-face-to-face time with patient and husband.  This included previsit chart review, lab review, study review, electronic health record documentation, and patient and  husband education and discussion regarding new onset BPPV, worsening migraine headaches, tremor, prior stroke with residual deficits, secondary stroke prevention measures and importance of aggressive stroke risk factor management and answered all other questions to patient and husband's satisfaction  Ihor Austin, AGNP-BC  [facility] [address] [place], [place] [address]  Phone [phone] Fax [phone] Note: This document was prepared with digital dictation and possible smart phrase technology. Any transcriptional errors that result from this process are unintentional.

## 2022-03-27 ENCOUNTER — Ambulatory Visit: Payer: Medicare Other | Admitting: Adult Health

## 2022-03-27 ENCOUNTER — Encounter: Payer: Self-pay | Admitting: Adult Health

## 2022-03-27 VITALS — BP 124/71 | HR 78 | Ht 65.0 in | Wt 162.0 lb

## 2022-03-27 DIAGNOSIS — H8110 Benign paroxysmal vertigo, unspecified ear: Secondary | ICD-10-CM

## 2022-03-27 DIAGNOSIS — I639 Cerebral infarction, unspecified: Secondary | ICD-10-CM | POA: Diagnosis not present

## 2022-03-27 DIAGNOSIS — G4733 Obstructive sleep apnea (adult) (pediatric): Secondary | ICD-10-CM | POA: Diagnosis not present

## 2022-03-27 DIAGNOSIS — R251 Tremor, unspecified: Secondary | ICD-10-CM | POA: Diagnosis not present

## 2022-03-27 DIAGNOSIS — G43109 Migraine with aura, not intractable, without status migrainosus: Secondary | ICD-10-CM

## 2022-03-27 LAB — CUP PACEART REMOTE DEVICE CHECK
Date Time Interrogation Session: 20230805230750
Implantable Pulse Generator Implant Date: 20220517

## 2022-03-27 NOTE — Progress Notes (Signed)
Carelink Summary Report / Loop Recorder 

## 2022-03-27 NOTE — Patient Instructions (Signed)
Continue nightly use of CPAP - we will plan on following up as scheduled in October   Continue aspirin 81 mg daily  and Crestor  for secondary stroke prevention  Continue to follow up with PCP regarding cholesterol and blood pressure management  Maintain strict control of hypertension with blood pressure goal below 130/90 and cholesterol with LDL cholesterol (bad cholesterol) goal below 70 mg/dL.   Signs of a Stroke? Follow the BEFAST method:  Balance Watch for a sudden loss of balance, trouble with coordination or vertigo Eyes Is there a sudden loss of vision in one or both eyes? Or double vision?  Face: Ask the person to smile. Does one side of the face droop or is it numb?  Arms: Ask the person to raise both arms. Does one arm drift downward? Is there weakness or numbness of a leg? Speech: Ask the person to repeat a simple phrase. Does the speech sound slurred/strange? Is the person confused ? Time: If you observe any of these signs, call 911.       Thank you for coming to see [place] at [facility]. I hope we have been able to provide you high quality care today.  You may receive a patient satisfaction survey over the next few weeks. We would appreciate your feedback and comments so that we may continue to improve ourselves and the health of our patients.

## 2022-04-01 ENCOUNTER — Ambulatory Visit: Payer: Self-pay

## 2022-04-03 ENCOUNTER — Telehealth: Payer: Self-pay | Admitting: Adult Health

## 2022-04-03 DIAGNOSIS — H8112 Benign paroxysmal vertigo, left ear: Secondary | ICD-10-CM | POA: Diagnosis not present

## 2022-04-03 MED ORDER — TOPIRAMATE 25 MG PO TABS: 25.0000 mg | ORAL_TABLET | Freq: Two times a day (BID) | ORAL | 5 refills | Status: DC

## 2022-04-03 NOTE — Telephone Encounter (Signed)
Contacted pt back, she stated her migraines have increased since last visit. She is having them 2-3 times a day now.  Did mention to her during last visit it was discussed that headaches could be due to OSA. Since recently  started CPAP therapy, she may need to some time to adjust  it. She is currently interested in preventative therapy as she previously declined interest before. Did inform her Shanda Bumps may want her to be on CPAP therapy a while longer but will advise her of this information.  She also stated she is having blurred vision that has worsen and dizziness with headaches. Confirmed no other stroke symptoms.

## 2022-04-03 NOTE — Addendum Note (Signed)
Addended by: Ihor Austin L on: 04/03/2022 12:26 PM   Modules accepted: Orders

## 2022-04-03 NOTE — Telephone Encounter (Signed)
Recommend trying topamax 25mg  twice daily for headaches. Hopefully headaches will improve the longer she is on CPAP therapy. Vision can be in setting of headache but would also advise her to schedule appt with ophthalmology to ensure.

## 2022-04-03 NOTE — Telephone Encounter (Signed)
Pt called and LVM stating that she is needing to speak to the Provider or RN regarding her migraines, blurry vision and dizziness she is experiencing. Please advise.

## 2022-04-03 NOTE — Telephone Encounter (Signed)
Contacted pt, spoke to pt souse per DPR, relayed jessica's recommendations to him. He was appreciative.

## 2022-04-04 ENCOUNTER — Encounter: Payer: Self-pay | Admitting: Adult Health

## 2022-04-04 NOTE — Telephone Encounter (Signed)
This medication can potentially cause blurred vision but is a low chance of this happening (4%). She can try to just take the nighttime dose over the next few days to see how she feels and then can add the morning dose.  Unfortunately, all medications can potentially come with side effects and majority of medications to help with headaches have similar side effects but does not mean she will necessarily experience any of the side effects and may significantly help her headaches where benefit would outweigh risk.

## 2022-04-15 DIAGNOSIS — G4733 Obstructive sleep apnea (adult) (pediatric): Secondary | ICD-10-CM | POA: Diagnosis not present

## 2022-04-23 DIAGNOSIS — L814 Other melanin hyperpigmentation: Secondary | ICD-10-CM | POA: Diagnosis not present

## 2022-04-23 DIAGNOSIS — I781 Nevus, non-neoplastic: Secondary | ICD-10-CM | POA: Diagnosis not present

## 2022-04-23 DIAGNOSIS — Z85828 Personal history of other malignant neoplasm of skin: Secondary | ICD-10-CM | POA: Diagnosis not present

## 2022-04-23 DIAGNOSIS — L821 Other seborrheic keratosis: Secondary | ICD-10-CM | POA: Diagnosis not present

## 2022-05-06 ENCOUNTER — Ambulatory Visit (INDEPENDENT_AMBULATORY_CARE_PROVIDER_SITE_OTHER): Payer: Medicare Other

## 2022-05-06 DIAGNOSIS — I639 Cerebral infarction, unspecified: Secondary | ICD-10-CM | POA: Diagnosis not present

## 2022-05-06 LAB — CUP PACEART REMOTE DEVICE CHECK
Date Time Interrogation Session: 20230917230334
Implantable Pulse Generator Implant Date: 20220517

## 2022-05-16 DIAGNOSIS — G4733 Obstructive sleep apnea (adult) (pediatric): Secondary | ICD-10-CM | POA: Diagnosis not present

## 2022-05-18 NOTE — Progress Notes (Signed)
Carelink Summary Report / Loop Recorder 

## 2022-06-10 ENCOUNTER — Ambulatory Visit (INDEPENDENT_AMBULATORY_CARE_PROVIDER_SITE_OTHER): Payer: Self-pay

## 2022-06-10 DIAGNOSIS — I639 Cerebral infarction, unspecified: Secondary | ICD-10-CM

## 2022-06-11 LAB — CUP PACEART REMOTE DEVICE CHECK
Date Time Interrogation Session: 20231022230649
Implantable Pulse Generator Implant Date: 20220517

## 2022-06-11 NOTE — Progress Notes (Unsigned)
[facility] [address] [place] [id number] [phone]       OFFICE FOLLOW UP NOTE  [name] Date of Birth:  12-15-1950 Medical Record Number:  [id number]   Reason for visit: Initial CPAP visit    SUBJECTIVE:   CHIEF COMPLAINT:  No chief complaint on file.    HPI:   Update 06/12/2022 JM: Patient returns for initial CPAP compliance visit.  Previously seen 2 months ago for stroke follow-up and overall stable at that time.  Completed sleep study back in July which showed severe OSA and initiated AutoPap 7/28.  Review of compliance report over the past 30 days shows 30 out of 30 usage days and 30 days greater than 4 hours for 100% compliance.  Average usage 8 hours and 2 minutes.  Residual AHI 3.3 on set pressure of 6.  Leaks in the 95 percentile 0.2.        History provided for reference purposes only Update 03/26/2022 JM: Patient returns for stroke follow-up after prior visit 4 months ago.  Overall stable without new stroke/TIA symptoms.  Reports residual left sided upper visual impairment but believes it has improved since prior visit.  Compliant on aspirin atorvastatin, denies side effects.  Blood pressure today 124/71.  Loop recorder has not shown atrial fibrillation thus far.  She continues to complain of mild headaches usually about 3 times per week and will resolve after eating.  No changes in frequency or characteristics since prior visit.  Headaches are not debilitating or interfere with activity.  Reports great improvement of tremor since eliminating GERD medication, she has since restarted Nexium due to worsening reflux without any issues.  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   Evaluated by Dr. Frances FurbishAthar and underwent PSG 7/11 which showed severe OSA with AHI 51/h and initiated AutoPap 7/28.  She denies any significant difficulty tolerating.  Has initial CPAP follow-up visit scheduled 10/25.  No  further concerns at this time  Update 11/22/2021 JM: Patient returns for 4587-month stroke follow-up accompanied by her husband.  Overall stable from stroke standpoint. Reports continued visual concerns, same as prior visit.  Was recently seen in ED on 3/13 for 3-4 wk onset of dizziness and worsening headaches, MR brain negative, noted exam consistent with peripheral vertigo and advised outpatient ENT follow-up.  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  Information for Epley maneuver provided, declined vestibular therapy. She was doing epley maneuver routinely at home but has not been doing them as routinely over the past week and has noticed worsening of vertigo - she questions if she is doing them correctly.  Continues to experience almost daily migraine headaches since onset of vertigo, typically triggered by hunger and feeling overheated.  Will take Tylenol and eat and will usually help.  She has never been treated for migraine headaches but does have history of migraines.  She also notes left > right arm shaking since her stroke and has been progressively worsening, typically worse in the morning and gets better throughout the day.  Does not interfere with activity.  Denies any family history of tremors or Parkinson's.  Does endorse daytime fatigue, at times insomnia, snores, occasional morning headaches and memory loss. Has not previously underwent sleep study - declined further evaluation at prior visits.   Compliant on aspirin and atorvastatin, denies side effects.  Blood pressure today 125/76.  Loop recorder has not shown atrial fibrillation thus far.  No further concerns at this time.   Update 05/23/2021 JM: Overall stable.  Continued left peripheral vision impairment - stable. Followed by ophthalmology (prior visit in Aug - has f/u 11/23).  Reports worsening blurred vision close up and issues with floaters (not a new issue).  Prior complaints of mild frontal headache with some improvement  still after looking at a computer for prolonged period of time or with hunger. Continued fatigue with activity - some improvement since prior visit. Continues to maintain all ADLs and IADLs independently as well as driving without difficulty.  Denies new stroke/TIA symptoms.  S/p successful PFO closure 03/09/2021 with Dr. Burt Knack.  Currently maintaining aspirin and Plavix tolerating without side effects for total of 67-month duration.  Compliant on atorvastatin without associated side effects.  Blood pressure today 146/92. Has not been recently monitored at home as previously stable.  Loop recorder has not shown atrial fibrillation thus far. PCP recently completed lab work which was satisfactory (unable to view via epic).  No further concerns at this time.   Initial visit 02/14/2021 JM: [name] is being seen for hospital follow-up unaccompanied.  She has been doing well since discharge without new stroke/TIA symptoms.  She does report residual left peripheral field blurred vision as well as fatigue and some short-term memory loss since her stroke.  She has returned back to all prior activities including driving and working.  She will occasionally experience a frontal headache after prolonged eyestrain and looking at the computer during work but will typically resolve on own or with Tylenol.  She has not experienced any additional migraines or visual auras.  She did have mild memory loss prior to her stroke but more likely age-related.  She has noticed herself fatiguing quicker with normal activities throughout the day -she did not have any fatigue concerns previously.  Completed 3 weeks DAPT and remains on aspirin alone as well as atorvastatin without associated side effects.  Blood pressure today 131/82.  Loop recorder has not shown atrial fibrillation thus far. Per reivew of epic, Dr. Burt Knack further reviewed TEE with definite PFO with color and verbal confirmation moderate size and has scheduled evaluation  7/6 to further discuss possible closure.  No further concerns at this time.    Stroke admission 12/31/2020 Yolanda Gibbs is a 71 y.o. female with a history of hypertension and migraine who presented on 12/31/2020 with 1 week history of visual changes describing and zigzag lines more prominent in the left.  Personally reviewed hospitalization pertinent progress notes, lab work and imaging with summary provided.  She initially was seen by her optometrist with no abnormality noted.  Was seen by PCP due to elevated BP no started on losartan but symptoms did not improve.  She eventually sought care at [facility] for CT head showed subacute occipital stroke and transferred to [facility].  Evaluated by Dr. Erlinda Hong with right occipital stroke likely due to migraine vs cardioembolic source.  TEE showed small PFO evaluated by cardiology Dr. Burt Knack for possible closure although low suspicion stroke etiology given size and age.  Placement of loop recorder for possible atrial fibrillation and stroke etiology.  Recommended DAPT for 3 weeks and aspirin HTN stable.  LDL 89 and started atorvastatin 40 mg daily.  Other stroke risk factors include advanced age, current EtOH use (3-4 drinks/week), family history of stroke and ocular migraines.  Residual subjective left visual field blurred vision (unable to appreciate visual field deficit on exam).  Evaluated by therapies and discharged  home in stable condition without therapy needs.  Stroke - right occipital small stroke likely due to migraine versus cardioembolic source. CT - Focal area of decreased attenuation in the posterior aspect of the mid right occipital lobe, felt to represent a recent and potential acute focal infarct in this area MRI: right occipital small infarct MRA head and neck: negative 2D Echo: EF 60-65%, mild LVH LE venous Doppler negative for DVT VTE prophylaxis - SCDs, Lovenox TEE: Positive bubble study but negative with color doppler, consistent with  small PFO Loop recorder implant (01/02/2021) A1c 6.2 LDL 89 On DAPT with plavix and ASA x 3 weeks then ASA alone. Therapy recommendations: Cleared for home by PT/OT with no needs Disposition:  Home         ROS:   14 system review of systems performed and negative with exception of those listed in HPI  PMH:  Past Medical History:  Diagnosis Date   Allergy    seasonal   Depression    GERD (gastroesophageal reflux disease)    History of chicken pox    History of colon polyps    benign   Hypertension    TIA (transient ischemic attack)     PSH:  Past Surgical History:  Procedure Laterality Date   BUBBLE STUDY  01/02/2021   Procedure: BUBBLE STUDY;  Surgeon: [name], MD;  Location: [facility] ENDOSCOPY;  Service: Cardiovascular;;   LOOP RECORDER INSERTION N/A 01/02/2021   Procedure: LOOP RECORDER INSERTION;  Surgeon: Thurmon Fair, MD;  Location: [facility] INVASIVE CV LAB;  Service: Cardiovascular;  Laterality: N/A;   PATENT FORAMEN OVALE(PFO) CLOSURE N/A 03/09/2021   Procedure: PATENT FORAMEN OVALE (PFO) CLOSURE;  Surgeon: Tonny Bollman, MD;  Location: [facility] INVASIVE CV LAB;  Service: Cardiovascular;  Laterality: N/A;   TEE WITHOUT CARDIOVERSION N/A 01/02/2021   Procedure: TRANSESOPHAGEAL ECHOCARDIOGRAM (TEE);  Surgeon: [name], MD;  Location: [facility] ENDOSCOPY;  Service: Cardiovascular;  Laterality: N/A;    Social History:  Social History   Socioeconomic History   Marital status: Married    Spouse name: Not on file   Number of children: Not on file   Years of education: Not on file   Highest education level: Not on file  Occupational History   Not on file  Tobacco Use   Smoking status: Never   Smokeless tobacco: Never  Vaping Use   Vaping Use: Never used  Substance and Sexual Activity   Alcohol use: Yes    Alcohol/week: 3.0 - 4.0 standard drinks of alcohol    Types: 3 - 4 Standard drinks or equivalent per week    Comment: occasional   Drug use: No    Sexual activity: Not on file  Other Topics Concern   Not on file  Social History Narrative   Daughter at Haroldine Laws (Julian Hy) born 14   Daughter Erin 50   Husband is a Curator, has a Forensic scientist, has a Landscape architect helps with day to day of the business   Second marriage- prior to remarrying worked in Passenger transport manager.   Completed college   Enjoys watching old movies, needlework, cleaning her house   No pets.    Caffiene occasional 12 oz Dr Reino Kent,  coffee on weekends.   Social Determinants of Health   Financial Resource Strain: Not on file  Food Insecurity: Not on file  Transportation Needs: Not on file  Physical Activity: Not on file  Stress: Not on file  Social Connections: Not on file  Intimate Partner Violence: Not on file    Family History:  Family History  Problem Relation Age of Onset   Arthritis Mother    CAD Mother    AAA (abdominal aortic aneurysm) Mother    Cancer Mother        lung   Stroke Father    Cancer Maternal Aunt        breast   Arthritis Maternal Grandmother    Cancer Maternal Grandfather        prostate   Depression Paternal Grandmother     Medications:   Current Outpatient Medications on File Prior to Visit  Medication Sig Dispense Refill   aspirin EC 81 MG EC tablet Take 1 tablet (81 mg total) by mouth daily. Swallow whole. 30 tablet 1   atorvastatin (LIPITOR) 40 MG tablet Take 1 tablet (40 mg total) by mouth daily. 30 tablet 1   Calcium-Phosphorus-Vitamin D (CITRACAL +D3 PO) Take 1 tablet by mouth daily. Vit d 500 IU, calcium 630mg  each     Carboxymethylcellulose Sod PF 0.5 % SOLN Place 1 drop into both eyes in the morning and at bedtime.     Docusate Sodium 100 MG capsule Take 200 mg by mouth at bedtime.     esomeprazole (NEXIUM) 20 MG capsule Take 20 mg by mouth daily at 12 noon.     losartan (COZAAR) 100 MG tablet Take 100 mg by mouth daily.     Multiple Vitamins-Minerals (MULTIVITAMIN ADULTS 50+) TABS Take 1 tablet by mouth  daily.     topiramate (TOPAMAX) 25 MG tablet Take 1 tablet (25 mg total) by mouth 2 (two) times daily. 60 tablet 5   venlafaxine XR (EFFEXOR-XR) 75 MG 24 hr capsule TAKE 1 CAPSULE BY MOUTH DAILY WITH BREAKFAST 90 capsule 0   No current facility-administered medications on file prior to visit.    Allergies:   Allergies  Allergen Reactions   Darvon [Propoxyphene] Other (See Comments)    "Slept for 3 days"   Duloxetine     Added from [facility]- patient is unsure       OBJECTIVE:  Physical Exam  There were no vitals filed for this visit.  There is no height or weight on file to calculate BMI. No results found.  General: well developed, well nourished, very pleasant middle-age Caucasian female, seated, in no evident distress Head: head normocephalic and atraumatic.   Neck: supple with no carotid or supraclavicular bruits Cardiovascular: regular rate and rhythm, no murmurs Musculoskeletal: no deformity Skin:  no rash/petichiae Vascular:  Normal pulses all extremities   Neurologic Exam Mental Status: Awake and fully alert. Fluent speech and language.  Oriented to place and time. Recent and remote memory intact. Attention span, concentration and fund of knowledge appropriate. Mood and affect appropriate.  Cranial Nerves: Pupils equal, briskly reactive to light. Extraocular movements full without nystagmus. Visual fields full to confrontation although subjective bilateral left upper blurred vision.  Hearing intact. Facial sensation intact. Face, tongue, palate moves normally and symmetrically.  Motor: Normal bulk and tone. Normal strength in all tested extremity muscles. Very slight action tremor L>R with outstretched arms.  No evidence of tremors at rest, bradykinesia or cogwheel rigidity Sensory.: intact to touch , pinprick , position and vibratory sensation.  Coordination: Rapid alternating movements normal in all extremities. Finger-to-nose and heel-to-shin showed mild  incoordination of left upper and lower extremity Gait and Station: Arises from chair without difficulty. Stance is normal. Gait demonstrates normal stride length and balance without  use of assistive device. Tandem walk and heel toe with mild difficulty.   Reflexes: 1+ and symmetric. Toes downgoing.         ASSESSMENT: Yolanda Gibbs is a 71 y.o. year old female with right occipital stroke embolic secondary to unknown source on 12/31/2020 s/p ILR. Vascular risk factors include HTN, HLD, small PFO, advanced age, ocular migraines and newly diagnosed severe OSA recently started AutoPap.  Seen in ED 10/2021 for 3-4 week onset of dizziness and worsening migraine headaches with diagnosis of BPPV per ENT.      PLAN:  OSA on CPAP: Compliance report shows satisfactory usage with optimal residual AHI.  Discussed continued nightly usage with ensuring greater than 4 hours nightly for optimal benefit and per insurance purposes.  Continue to follow with DME company for any needed supplies or CPAP related concerns  R occipital stroke, cryptogenic:  Residual deficit: Subjective mild left peripheral visual impairment.  Routinely followed by ophthalmology.  Does not interfere with daily activity or functioning.  Loop recorder has not shown atrial fibrillation thus far  S/p PFO closure 02/2021 by Dr. Excell Seltzer, continue to follow with cardiology Continue aspirin 81 mg daily  and atorvastatin 40 mg daily for secondary stroke prevention managed/monitored by PCP.   Discussed secondary stroke prevention measures and importance of close PCP follow up for aggressive stroke risk factor management including BP goal<130/90, and HLD with LDL goal<70   Migraine headaches: Occurs a few times per week and resolves after eating.  Recently diagnosed with severe OSA and initiation of CPAP - hopefully headaches will improve on CPAP.  If headaches persist at follow-up visit, can reconsider initiating preventative therapy as she  previously declined interest.    Tremor: Greatly improved after stopping acid reflux medications.  Still very slight tremor.  Does not interfere with daily activity or functioning.  Continue to monitor.  BPPV: stable. Does Epley maneuver at home with episodes with benefit    Follow-up as scheduled in October or call earlier if needed    CC:  PCP: Melvenia Beam, MD    I spent 34 minutes of face-to-face and non-face-to-face time with patient.  This included previsit chart review, lab review, study review, electronic health record documentation, and patient education and discussion regarding history of prior stroke with residual deficits, secondary stroke prevention measures and importance of aggressive stroke risk factor management, and discussed other diagnoses as listed above and answered all other questions to patients satisfaction  Ihor Austin, AGNP-BC  [facility] [address] [place], [place] [address]  Phone [phone] Fax [phone] Note: This document was prepared with digital dictation and possible smart phrase technology. Any transcriptional errors that result from this process are unintentional.

## 2022-06-12 ENCOUNTER — Encounter: Payer: Self-pay | Admitting: Adult Health

## 2022-06-12 ENCOUNTER — Ambulatory Visit: Payer: Medicare Other | Admitting: Adult Health

## 2022-06-12 VITALS — BP 116/67 | HR 82 | Ht 64.0 in | Wt 164.0 lb

## 2022-06-12 DIAGNOSIS — I639 Cerebral infarction, unspecified: Secondary | ICD-10-CM

## 2022-06-12 DIAGNOSIS — G43E09 Chronic migraine with aura, not intractable, without status migrainosus: Secondary | ICD-10-CM

## 2022-06-12 DIAGNOSIS — G4733 Obstructive sleep apnea (adult) (pediatric): Secondary | ICD-10-CM

## 2022-06-12 MED ORDER — TOPIRAMATE 25 MG PO TABS: 25.0000 mg | ORAL_TABLET | Freq: Two times a day (BID) | ORAL | 5 refills | Status: DC

## 2022-06-12 NOTE — Patient Instructions (Signed)
Continue nightly use of CPAP   Continue topamax 25mg  twice daily for headache prevention     Followup in the future with me in 5 months or call earlier if needed       Thank you for coming to see [place] at [facility]. I hope we have been able to provide you high quality care today.  You may receive a patient satisfaction survey over the next few weeks. We would appreciate your feedback and comments so that we may continue to improve ourselves and the health of our patients.

## 2022-06-15 DIAGNOSIS — G4733 Obstructive sleep apnea (adult) (pediatric): Secondary | ICD-10-CM | POA: Diagnosis not present

## 2022-07-02 NOTE — Progress Notes (Signed)
Carelink Summary Report / Loop Recorder 

## 2022-07-15 ENCOUNTER — Ambulatory Visit (INDEPENDENT_AMBULATORY_CARE_PROVIDER_SITE_OTHER): Payer: Self-pay

## 2022-07-15 DIAGNOSIS — I639 Cerebral infarction, unspecified: Secondary | ICD-10-CM

## 2022-07-15 LAB — CUP PACEART REMOTE DEVICE CHECK
Date Time Interrogation Session: 20231126230624
Implantable Pulse Generator Implant Date: 20220517

## 2022-07-25 DIAGNOSIS — R053 Chronic cough: Secondary | ICD-10-CM | POA: Diagnosis not present

## 2022-07-25 DIAGNOSIS — H8112 Benign paroxysmal vertigo, left ear: Secondary | ICD-10-CM | POA: Diagnosis not present

## 2022-07-25 DIAGNOSIS — K219 Gastro-esophageal reflux disease without esophagitis: Secondary | ICD-10-CM | POA: Diagnosis not present

## 2022-07-31 DIAGNOSIS — H2513 Age-related nuclear cataract, bilateral: Secondary | ICD-10-CM | POA: Diagnosis not present

## 2022-07-31 DIAGNOSIS — H43813 Vitreous degeneration, bilateral: Secondary | ICD-10-CM | POA: Diagnosis not present

## 2022-07-31 DIAGNOSIS — G43109 Migraine with aura, not intractable, without status migrainosus: Secondary | ICD-10-CM | POA: Diagnosis not present

## 2022-07-31 DIAGNOSIS — H40013 Open angle with borderline findings, low risk, bilateral: Secondary | ICD-10-CM | POA: Diagnosis not present

## 2022-08-06 DIAGNOSIS — G4733 Obstructive sleep apnea (adult) (pediatric): Secondary | ICD-10-CM | POA: Diagnosis not present

## 2022-08-20 DIAGNOSIS — M5382 Other specified dorsopathies, cervical region: Secondary | ICD-10-CM | POA: Diagnosis not present

## 2022-08-20 DIAGNOSIS — H8112 Benign paroxysmal vertigo, left ear: Secondary | ICD-10-CM | POA: Diagnosis not present

## 2022-08-26 NOTE — Progress Notes (Signed)
Carelink Summary Report / Loop Recorder 

## 2022-08-30 DIAGNOSIS — H8112 Benign paroxysmal vertigo, left ear: Secondary | ICD-10-CM | POA: Diagnosis not present

## 2022-08-30 DIAGNOSIS — M5382 Other specified dorsopathies, cervical region: Secondary | ICD-10-CM | POA: Diagnosis not present

## 2022-09-06 DIAGNOSIS — H8112 Benign paroxysmal vertigo, left ear: Secondary | ICD-10-CM | POA: Diagnosis not present

## 2022-09-06 DIAGNOSIS — M5382 Other specified dorsopathies, cervical region: Secondary | ICD-10-CM | POA: Diagnosis not present

## 2022-09-13 DIAGNOSIS — M5382 Other specified dorsopathies, cervical region: Secondary | ICD-10-CM | POA: Diagnosis not present

## 2022-09-13 DIAGNOSIS — H8112 Benign paroxysmal vertigo, left ear: Secondary | ICD-10-CM | POA: Diagnosis not present

## 2022-09-20 DIAGNOSIS — H8112 Benign paroxysmal vertigo, left ear: Secondary | ICD-10-CM | POA: Diagnosis not present

## 2022-09-20 DIAGNOSIS — M5382 Other specified dorsopathies, cervical region: Secondary | ICD-10-CM | POA: Diagnosis not present

## 2022-09-23 ENCOUNTER — Ambulatory Visit: Payer: Self-pay

## 2022-09-26 DIAGNOSIS — R0982 Postnasal drip: Secondary | ICD-10-CM | POA: Diagnosis not present

## 2022-09-26 DIAGNOSIS — G4733 Obstructive sleep apnea (adult) (pediatric): Secondary | ICD-10-CM | POA: Diagnosis not present

## 2022-09-26 DIAGNOSIS — K5900 Constipation, unspecified: Secondary | ICD-10-CM | POA: Diagnosis not present

## 2022-09-26 DIAGNOSIS — E162 Hypoglycemia, unspecified: Secondary | ICD-10-CM | POA: Diagnosis not present

## 2022-09-26 DIAGNOSIS — I10 Essential (primary) hypertension: Secondary | ICD-10-CM | POA: Diagnosis not present

## 2022-09-26 DIAGNOSIS — E785 Hyperlipidemia, unspecified: Secondary | ICD-10-CM | POA: Diagnosis not present

## 2022-09-26 DIAGNOSIS — J449 Chronic obstructive pulmonary disease, unspecified: Secondary | ICD-10-CM | POA: Diagnosis not present

## 2022-09-26 DIAGNOSIS — R7303 Prediabetes: Secondary | ICD-10-CM | POA: Diagnosis not present

## 2022-10-22 ENCOUNTER — Telehealth: Payer: Self-pay | Admitting: Adult Health

## 2022-10-22 NOTE — Telephone Encounter (Signed)
Called the pt back. She states that she has been having a increase in frequency of the ocular migraines. She states that she has noticed increase in having difficulty with her vision and then developing a headache. Today it was to the point she did take ibuprofen and that helped ease it up. Pt is taking the topiramate 25 mg BID. Advised that she can increase the topiramate to 50 mg BID. Pt will start by taking 2 tablet twice a day to equal that strength. She will reach out if tolerating well and we will send a updated script into the pharmacy if tolerates the increase. Pt verbalized understanding and was appreciative for the call back. She has a upcoming apt with Janett Billow march 20th

## 2022-10-22 NOTE — Telephone Encounter (Signed)
Pt called and LVM stating that she is wanting the RN or MD to call her back to discus her Ocular Migraines. Please advise.

## 2022-10-25 LAB — CUP PACEART REMOTE DEVICE CHECK
Date Time Interrogation Session: 20240307231054
Implantable Pulse Generator Implant Date: 20220517

## 2022-10-28 ENCOUNTER — Ambulatory Visit: Payer: Medicare Other

## 2022-10-28 DIAGNOSIS — I639 Cerebral infarction, unspecified: Secondary | ICD-10-CM

## 2022-11-08 DIAGNOSIS — M85852 Other specified disorders of bone density and structure, left thigh: Secondary | ICD-10-CM | POA: Diagnosis not present

## 2022-11-08 LAB — HM DEXA SCAN: HM Dexa Scan: NORMAL

## 2022-11-11 ENCOUNTER — Ambulatory Visit: Payer: Medicare Other | Admitting: Adult Health

## 2022-11-11 ENCOUNTER — Encounter: Payer: Self-pay | Admitting: Adult Health

## 2022-11-11 VITALS — BP 130/74 | HR 88 | Ht 64.0 in | Wt 168.4 lb

## 2022-11-11 DIAGNOSIS — H9203 Otalgia, bilateral: Secondary | ICD-10-CM

## 2022-11-11 DIAGNOSIS — G4733 Obstructive sleep apnea (adult) (pediatric): Secondary | ICD-10-CM | POA: Diagnosis not present

## 2022-11-11 DIAGNOSIS — G4719 Other hypersomnia: Secondary | ICD-10-CM

## 2022-11-11 DIAGNOSIS — G43E09 Chronic migraine with aura, not intractable, without status migrainosus: Secondary | ICD-10-CM

## 2022-11-11 DIAGNOSIS — I639 Cerebral infarction, unspecified: Secondary | ICD-10-CM

## 2022-11-11 DIAGNOSIS — R413 Other amnesia: Secondary | ICD-10-CM

## 2022-11-11 DIAGNOSIS — E538 Deficiency of other specified B group vitamins: Secondary | ICD-10-CM | POA: Diagnosis not present

## 2022-11-11 DIAGNOSIS — R42 Dizziness and giddiness: Secondary | ICD-10-CM | POA: Diagnosis not present

## 2022-11-11 DIAGNOSIS — H8110 Benign paroxysmal vertigo, unspecified ear: Secondary | ICD-10-CM

## 2022-11-11 MED ORDER — DIAZEPAM 2 MG PO TABS: 2.0000 mg | ORAL_TABLET | ORAL | 0 refills | Status: AC | PRN

## 2022-11-11 NOTE — Patient Instructions (Addendum)
You will be called to schedule an MRI brain to rule out vascular causes of your dizziness and memory concerns  We will also check lab work today - I will let you know via MyChart how your labs look tomorrow and further recommendations   New referral will be placed back to ENT to further evaluate inner ear concerns   I will send an order to your DME company to be evaluated for a different mask choice - if you continue to have difficulties with this, please let me know  Continue topamax 50mg  twice daily for headache prevention     Follow up in 6 months or call earlier if needed

## 2022-11-11 NOTE — Progress Notes (Signed)
[facility] [address] [place]. [place] [address] [phone]       OFFICE FOLLOW UP NOTE  [name] Date of Birth:  Jan 07, 1951 Medical Record Number:  [id number]   Reason for visit: OSA on CPAP, hx of stroke    SUBJECTIVE:   CHIEF COMPLAINT:  Chief Complaint  Patient presents with   Follow-up    Patient in room #3 with her husband. Patient states she been having some dizziness and feeling lite headed. Pt states she has been tired and feeling achy.     HPI:   Update 11/11/2022 JM: Patient returns for 17-month follow-up accompanied by her husband.  OSA on CPAP: Compliance report over the past 30 days shows 30 out of 30 usage days with 30 days greater than 4 hours for 100% compliance.  Residual AHI 2.7.  Set pressure 6 with EPR 3.  Leaks in the 95th percentile 1.4. still feels like she doesn't sleep good. She feels tired. Still wakes up with headache which gets better as she gets up and moving (different from her typical migraine headaches). She questions if headaches are due to facial mask and straps, feels current use of nasal mask uncomfortable and causes redness on bridge of nose. She has tried to f/u with DME [facility] regarding this, was supposed to get new padding for nasal mask but been having great difficulty obtaining.  She is interested in other mask types.   Patient called office 3/4 c/o worsening migraines with visual aura and topiramate increased to 50 mg twice daily with improvement, did have recent ocular symptoms this past weekend which did not progress to migraine headache.  She is tolerating increased dose of topiramate without difficulty.  She also mentions ongoing concerns with dizziness/vertigo.  Was seen by ENT back in December for BPPV, worked with PT in January and February but per patient, unable to trigger vertigo symptoms therefore was released. She feels like dizziness symptoms have been more persistent since then. She  describes symptoms as a swimmy headedness or lightheadedness as well as room spinning sensation.  Unable to identify triggers.  She also complains of inner ear pain and feels symptoms worse when ears feel clogged, increased draining or will have sensation as though something is moving inside of her ears.  She questions more in-depth evaluation of inner ear.  Husband is greatly concerned regarding these dizziness symptoms as well as her occasionally looking pale and fatigued.  He also mentions concerns with her memory, gives example that they are trying to consolidate bills, she has been struggling with this over the past month, feels overwhelmed, will come up with a plan but then will forget the plan the next day. No other issues with her memory, continues to maintain ADLs and all other IADLs independently. She does not drive often due to dizziness.   Compliant on aspirin and atorvastatin.  Blood pressure well-controlled.  Loop recorder has not shown atrial fibrillation thus far.  Prior follow-up visit with PCP back in January.      History provided for reference purposes only Update 06/12/2022 JM: Patient returns for initial CPAP compliance visit.  Previously seen 2 months ago for stroke follow-up and overall stable at that time.  Completed sleep study back in July which showed severe OSA and initiated AutoPap 7/28.  Review of compliance report over the past 30 days shows 30 out of 30 usage days and 30 days greater than 4 hours for 100% compliance.  Average usage 8  hours and 2 minutes.  Residual AHI 3.3 on set pressure of 6.  Leaks in the 95 percentile 0.2.  She reports tolerating CPAP well.  She is scheduled to follow-up with her DME company on Monday due to some irritation around the bridge of her nose with current mask use.  She otherwise has no specific concerns.  Epworth Sleepiness Scale 11/24 (prior to CPAP 13/24)  Remains stable from stroke standpoint without new stroke/TIA symptoms.  Reports  improvement of headaches since initiating topiramate 25 mg twice daily, denies side effects.  Compliant on aspirin and atorvastatin.  Blood pressure well controlled.  Loop recorder has not shown atrial fibrillation thus far.   Update 03/26/2022 JM: Patient returns for stroke follow-up after prior visit 4 months ago.  Overall stable without new stroke/TIA symptoms.  Reports residual left sided upper visual impairment but believes it has improved since prior visit.  Compliant on aspirin atorvastatin, denies side effects.  Blood pressure today 124/71.  Loop recorder has not shown atrial fibrillation thus far.  She continues to complain of mild headaches usually about 3 times per week and will resolve after eating.  No changes in frequency or characteristics since prior visit.  Headaches are not debilitating or interfere with activity.  Reports great improvement of tremor since eliminating GERD medication, she has since restarted Nexium due to worsening reflux without any issues.  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   Evaluated by Dr. Rexene Alberts and underwent PSG 7/11 which showed severe OSA with AHI 51/h and initiated AutoPap 7/28.  She denies any significant difficulty tolerating.  Has initial CPAP follow-up visit scheduled 10/25.  No further concerns at this time  Update 11/22/2021 JM: Patient returns for 25-month stroke follow-up accompanied by her husband.  Overall stable from stroke standpoint. Reports continued visual concerns, same as prior visit.  Was recently seen in ED on 3/13 for 3-4 wk onset of dizziness and worsening headaches, MR brain negative, noted exam consistent with peripheral vertigo and advised outpatient ENT follow-up.  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  Information for Epley maneuver provided, declined vestibular therapy. She was doing epley maneuver routinely at home but has not been doing them as routinely over the past week and  has noticed worsening of vertigo - she questions if she is doing them correctly.  Continues to experience almost daily migraine headaches since onset of vertigo, typically triggered by hunger and feeling overheated.  Will take Tylenol and eat and will usually help.  She has never been treated for migraine headaches but does have history of migraines.  She also notes left > right arm shaking since her stroke and has been progressively worsening, typically worse in the morning and gets better throughout the day.  Does not interfere with activity.  Denies any family history of tremors or Parkinson's.  Does endorse daytime fatigue, at times insomnia, snores, occasional morning headaches and memory loss. Has not previously underwent sleep study - declined further evaluation at prior visits.   Compliant on aspirin and atorvastatin, denies side effects.  Blood pressure today 125/76.  Loop recorder has not shown atrial fibrillation thus far.  No further concerns at this time.   Update 05/23/2021 JM: Overall stable.  Continued left peripheral vision impairment - stable. Followed by ophthalmology (prior visit in Aug - has f/u 11/23).  Reports worsening blurred vision close up and issues with floaters (not a new issue).  Prior complaints  of mild frontal headache with some improvement still after looking at a computer for prolonged period of time or with hunger. Continued fatigue with activity - some improvement since prior visit. Continues to maintain all ADLs and IADLs independently as well as driving without difficulty.  Denies new stroke/TIA symptoms.  S/p successful PFO closure 03/09/2021 with Dr. Burt Knack.  Currently maintaining aspirin and Plavix tolerating without side effects for total of 38-month duration.  Compliant on atorvastatin without associated side effects.  Blood pressure today 146/92. Has not been recently monitored at home as previously stable.  Loop recorder has not shown atrial fibrillation thus far.  PCP recently completed lab work which was satisfactory (unable to view via epic).  No further concerns at this time.   Initial visit 02/14/2021 JM: Ms. Wachs is being seen for hospital follow-up unaccompanied.  She has been doing well since discharge without new stroke/TIA symptoms.  She does report residual left peripheral field blurred vision as well as fatigue and some short-term memory loss since her stroke.  She has returned back to all prior activities including driving and working.  She will occasionally experience a frontal headache after prolonged eyestrain and looking at the computer during work but will typically resolve on own or with Tylenol.  She has not experienced any additional migraines or visual auras.  She did have mild memory loss prior to her stroke but more likely age-related.  She has noticed herself fatiguing quicker with normal activities throughout the day -she did not have any fatigue concerns previously.  Completed 3 weeks DAPT and remains on aspirin alone as well as atorvastatin without associated side effects.  Blood pressure today 131/82.  Loop recorder has not shown atrial fibrillation thus far. Per reivew of epic, Dr. Burt Knack further reviewed TEE with definite PFO with color and verbal confirmation moderate size and has scheduled evaluation 7/6 to further discuss possible closure.  No further concerns at this time.    Stroke admission 12/31/2020 Jeralene Mcever is a 72 y.o. female with a history of hypertension and migraine who presented on 12/31/2020 with 1 week history of visual changes describing and zigzag lines more prominent in the left.  Personally reviewed hospitalization pertinent progress notes, lab work and imaging with summary provided.  She initially was seen by her optometrist with no abnormality noted.  Was seen by PCP due to elevated BP no started on losartan but symptoms did not improve.  She eventually sought care at [facility] for CT head showed  subacute occipital stroke and transferred to [facility].  Evaluated by Dr. Erlinda Hong with right occipital stroke likely due to migraine vs cardioembolic source.  TEE showed small PFO evaluated by cardiology Dr. Burt Knack for possible closure although low suspicion stroke etiology given size and age.  Placement of loop recorder for possible atrial fibrillation and stroke etiology.  Recommended DAPT for 3 weeks and aspirin HTN stable.  LDL 89 and started atorvastatin 40 mg daily.  Other stroke risk factors include advanced age, current EtOH use (3-4 drinks/week), family history of stroke and ocular migraines.  Residual subjective left visual field blurred vision (unable to appreciate visual field deficit on exam).  Evaluated by therapies and discharged home in stable condition without therapy needs.  Stroke - right occipital small stroke likely due to migraine versus cardioembolic source. CT - Focal area of decreased attenuation in the posterior aspect of the mid right occipital lobe, felt to represent a recent and potential acute focal infarct in this  area MRI: right occipital small infarct MRA head and neck: negative 2D Echo: EF 60-65%, mild LVH LE venous Doppler negative for DVT VTE prophylaxis - SCDs, Lovenox TEE: Positive bubble study but negative with color doppler, consistent with small PFO Loop recorder implant (01/02/2021) A1c 6.2 LDL 89 On DAPT with plavix and ASA x 3 weeks then ASA alone. Therapy recommendations: Cleared for home by PT/OT with no needs Disposition:  Home         ROS:   14 system review of systems performed and negative with exception of those listed in HPI  PMH:  Past Medical History:  Diagnosis Date   Allergy    seasonal   Depression    GERD (gastroesophageal reflux disease)    History of chicken pox    History of colon polyps    benign   Hypertension    TIA (transient ischemic attack)     PSH:  Past Surgical History:  Procedure Laterality Date   BUBBLE STUDY   01/02/2021   Procedure: BUBBLE STUDY;  Surgeon: Donato Heinz, MD;  Location: [facility];  Service: Cardiovascular;;   LOOP RECORDER INSERTION N/A 01/02/2021   Procedure: LOOP RECORDER INSERTION;  Surgeon: Sanda Klein, MD;  Location: [facility];  Service: Cardiovascular;  Laterality: N/A;   PATENT FORAMEN OVALE(PFO) CLOSURE N/A 03/09/2021   Procedure: PATENT FORAMEN OVALE (PFO) CLOSURE;  Surgeon: Sherren Mocha, MD;  Location: [place] [facility];  Service: Cardiovascular;  Laterality: N/A;   TEE WITHOUT CARDIOVERSION N/A 01/02/2021   Procedure: TRANSESOPHAGEAL ECHOCARDIOGRAM (TEE);  Surgeon: Donato Heinz, MD;  Location: [facility] ENDOSCOPY;  Service: Cardiovascular;  Laterality: N/A;    Social History:  Social History   Socioeconomic History   Marital status: Married    Spouse name: Not on file   Number of children: Not on file   Years of education: Not on file   Highest education level: Not on file  Occupational History   Not on file  Tobacco Use   Smoking status: Never   Smokeless tobacco: Never  Vaping Use   Vaping Use: Never used  Substance and Sexual Activity   Alcohol use: Yes    Alcohol/week: 3.0 - 4.0 standard drinks of alcohol    Types: 3 - 4 Standard drinks or equivalent per week    Comment: occasional   Drug use: No   Sexual activity: Not on file  Other Topics Concern   Not on file  Social History Narrative   Daughter at [employer] (Johann Capers) born 59   Daughter Erin 28   Husband is a Dealer, has a Camera operator, has a Automotive engineer helps with day to day of the business   Second marriage- 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   Completed college   Enjoys watching old movies, needlework, cleaning her house   No pets.    Caffiene occasional 12 oz Dr Malachi Bonds,  coffee on weekends.   Social Determinants of Health   Financial Resource Strain: Not on file  Food Insecurity: Not on file  Transportation Needs: Not on file   Physical Activity: Not on file  Stress: Not on file  Social Connections: Not on file  Intimate Partner Violence: Not on file    Family History:  Family History  Problem Relation Age of Onset   Arthritis Mother    CAD Mother    AAA (abdominal aortic aneurysm) Mother    Cancer Mother  lung   Stroke Father    Cancer Maternal Aunt        breast   Arthritis Maternal Grandmother    Cancer Maternal Grandfather        prostate   Depression Paternal Grandmother     Medications:   Current Outpatient Medications on File Prior to Visit  Medication Sig Dispense Refill   aspirin EC 81 MG EC tablet Take 1 tablet (81 mg total) by mouth daily. Swallow whole. 30 tablet 1   atorvastatin (LIPITOR) 40 MG tablet Take 1 tablet (40 mg total) by mouth daily. 30 tablet 1   Calcium-Phosphorus-Vitamin D (CITRACAL +D3 PO) Take 1 tablet by mouth daily. Vit d 500 IU, calcium 630mg  each     esomeprazole (NEXIUM) 20 MG capsule Take 40 mg by mouth daily at 12 noon.     losartan (COZAAR) 100 MG tablet Take 100 mg by mouth daily.     Multiple Vitamins-Minerals (MULTIVITAMIN ADULTS 50+) TABS Take 1 tablet by mouth daily.     topiramate (TOPAMAX) 25 MG tablet Take 1 tablet (25 mg total) by mouth 2 (two) times daily. 60 tablet 5   venlafaxine XR (EFFEXOR-XR) 75 MG 24 hr capsule TAKE 1 CAPSULE BY MOUTH DAILY WITH BREAKFAST 90 capsule 0   Carboxymethylcellulose Sod PF 0.5 % SOLN Place 1 drop into both eyes in the morning and at bedtime. (Patient not taking: Reported on 11/11/2022)     Docusate Sodium 100 MG capsule Take 200 mg by mouth at bedtime. (Patient not taking: Reported on 11/11/2022)     No current facility-administered medications on file prior to visit.    Allergies:   Allergies  Allergen Reactions   Darvon [Propoxyphene] Other (See Comments)    "Slept for 3 days"   Duloxetine     Added from [facility]- patient is unsure       OBJECTIVE:  Physical Exam  Vitals:   11/11/22 1417   BP: 130/74  Pulse: 88  Weight: 168 lb 6.4 oz (76.4 kg)  Height: 5\' 4"  (1.626 m)   Body mass index is 28.91 kg/m. No results found.   General: well developed, well nourished, very pleasant middle-age Caucasian female, seated, in no evident distress Head: head normocephalic and atraumatic.   Neck: supple with no carotid or supraclavicular bruits Cardiovascular: regular rate and rhythm, no murmurs Musculoskeletal: no deformity Skin:  no rash/petichiae Vascular:  Normal pulses all extremities   Neurologic Exam Mental Status: Awake and fully alert. Fluent speech and language.  Oriented to place and time. Recent and remote memory intact. Attention span, concentration and fund of knowledge appropriate. Mood and affect appropriate.  Cranial Nerves: Pupils equal, briskly reactive to light. Extraocular movements full without nystagmus. Visual fields full to confrontation.  Hearing intact. Facial sensation intact. Face, tongue, palate moves normally and symmetrically.  Motor: Normal bulk and tone. Normal strength in all tested extremity muscles.  Sensory.: intact to touch , pinprick , position and vibratory sensation.  Coordination: Rapid alternating movements normal in all extremities. Finger-to-nose and heel-to-shin performed accurately bilaterally Gait and Station: Arises from chair without difficulty. Stance is normal. Gait demonstrates normal stride length and balance without use of assistive device. Tandem walk and heel toe without difficulty.  Romberg negative. Reflexes: 1+ and symmetric. Toes downgoing.         ASSESSMENT: NAO CALLICOAT is a 72 y.o. year old female with right occipital stroke embolic secondary to unknown source on 12/31/2020 s/p ILR. Vascular risk factors  include HTN, HLD, small PFO, BPPV, advanced age, ocular migraines and severe OSA on CPAP. Today, 11/11/2022, c/o worsening dizziness, memory concerns, fatigue and per husband appearing pale.      PLAN:  OSA  on CPAP: Compliance report shows satisfactory usage with optimal residual AHI.  Continue current pressure settings. Will send order to DME company requesting change of interface due to difficulty tolerating. Discussed continued nightly usage with ensuring greater than 4 hours nightly for optimal benefit and per insurance purposes.  Continue to follow with DME company for any needed supplies or CPAP related concerns  R occipital stroke, cryptogenic:  Loop recorder has not shown atrial fibrillation thus far  S/p PFO closure 02/2021 by Dr. Burt Knack, continue to follow with cardiology Continue aspirin 81 mg daily  and atorvastatin 40 mg daily for secondary stroke prevention managed/monitored by PCP.   Discussed secondary stroke prevention measures and importance of close PCP follow up for aggressive stroke risk factor management including BP goal<130/90, and HLD with LDL goal<70   Migraine headaches:  Currently stable on topiramate 50 mg twice daily Continue nightly use of CPAP  BPPV: Dizziness:  Inner ear pain C/o gradual worsening over the past couple of months.  Will repeat MRI brain to ensure no structural abnormality contributing to symptoms although low suspicion as neuro exam today intact. Will send in 1x rx of diazepam for claustrophobia - husband will bring to appointment  Referral placed back to ENT for further evaluation of inner ear pain possibly contributing to symptoms Completed PT back in February, may need to consider restart if symptoms persist but will defer to ENT  Memory loss:  recent onset over the past month more so with more complicated tasks Complete MRI brain to rule out structural abnormality Will complete lab work to look for reversible causes  Fatigue:  Pale appearance: will check lab work to look for reversible causes but also encouraged f/u with PCP for further evaluation.       Follow-up in 6 months or call earlier if needed    CC:  PCP: Shanon Ace, MD    I spent a prolonged 50 minutes of face-to-face and non-face-to-face time with patient and husband.  This included previsit chart review, lab review, study review, electronic health record documentation, and patient education and discussion regarding above diagnoses and treatment plan and answered all the questions to patient and husband's satisfaction  Frann Rider, [facility]  [facility] [address] [place], [place] [address]  Phone [phone] Fax [phone] Note: This document was prepared with digital dictation and possible smart phrase technology. Any transcriptional errors that result from this process are unintentional.

## 2022-11-12 LAB — CBC WITH DIFFERENTIAL/PLATELET
Basophils Absolute: 0.1 10*3/uL (ref 0.0–0.2)
Basos: 1 %
EOS (ABSOLUTE): 0.3 10*3/uL (ref 0.0–0.4)
Eos: 4 %
Hematocrit: 44.3 % (ref 34.0–46.6)
Hemoglobin: 14.4 g/dL (ref 11.1–15.9)
Immature Grans (Abs): 0 10*3/uL (ref 0.0–0.1)
Immature Granulocytes: 0 %
Lymphocytes Absolute: 2 10*3/uL (ref 0.7–3.1)
Lymphs: 31 %
MCH: 30.1 pg (ref 26.6–33.0)
MCHC: 32.5 g/dL (ref 31.5–35.7)
MCV: 93 fL (ref 79–97)
Monocytes Absolute: 0.6 10*3/uL (ref 0.1–0.9)
Monocytes: 9 %
Neutrophils Absolute: 3.6 10*3/uL (ref 1.4–7.0)
Neutrophils: 55 %
Platelets: 335 10*3/uL (ref 150–450)
RBC: 4.79 x10E6/uL (ref 3.77–5.28)
RDW: 12.9 % (ref 11.7–15.4)
WBC: 6.5 10*3/uL (ref 3.4–10.8)

## 2022-11-12 LAB — COMPREHENSIVE METABOLIC PANEL
ALT: 37 IU/L — ABNORMAL HIGH (ref 0–32)
AST: 27 IU/L (ref 0–40)
Albumin/Globulin Ratio: 2 (ref 1.2–2.2)
Albumin: 4.6 g/dL (ref 3.9–4.9)
Alkaline Phosphatase: 140 IU/L — ABNORMAL HIGH (ref 44–121)
BUN/Creatinine Ratio: 23 (ref 12–28)
BUN: 17 mg/dL (ref 8–27)
Bilirubin Total: <0.2 mg/dL (ref 0.0–1.2)
CO2: 22 mmol/L (ref 20–29)
Calcium: 10.2 mg/dL (ref 8.7–10.3)
Chloride: 107 mmol/L — ABNORMAL HIGH (ref 96–106)
Creatinine, Ser: 0.75 mg/dL (ref 0.57–1.00)
Globulin, Total: 2.3 g/dL (ref 1.5–4.5)
Glucose: 100 mg/dL — ABNORMAL HIGH (ref 70–99)
Potassium: 4.6 mmol/L (ref 3.5–5.2)
Sodium: 142 mmol/L (ref 134–144)
Total Protein: 6.9 g/dL (ref 6.0–8.5)
eGFR: 86 mL/min/{1.73_m2} (ref >59)

## 2022-11-12 LAB — IRON,TIBC AND FERRITIN PANEL
Ferritin: 21 ng/mL (ref 15–150)
Iron Saturation: 23 % (ref 15–55)
Iron: 84 ug/dL (ref 27–139)
Total Iron Binding Capacity: 367 ug/dL (ref 250–450)
UIBC: 283 ug/dL (ref 118–369)

## 2022-11-12 LAB — VITAMIN B12: Vitamin B-12: >2000 pg/mL — ABNORMAL HIGH (ref 232–1245)

## 2022-11-12 LAB — TSH: TSH: 1.4 u[IU]/mL (ref 0.450–4.500)

## 2022-11-13 ENCOUNTER — Telehealth: Payer: Self-pay | Admitting: Adult Health

## 2022-11-13 ENCOUNTER — Telehealth: Payer: Self-pay

## 2022-11-13 NOTE — Telephone Encounter (Signed)
I called [employer] to get additional help.

## 2022-11-13 NOTE — Telephone Encounter (Signed)
Referral sent to [facility] - [place] in [place]. [phone]

## 2022-11-14 ENCOUNTER — Telehealth: Payer: Self-pay

## 2022-11-14 ENCOUNTER — Telehealth: Payer: Self-pay | Admitting: Adult Health

## 2022-11-14 NOTE — Telephone Encounter (Signed)
Received a request for PA. Called patient to inform her that she would be better off going on [email or web address] for a cheaper price for her Rx at [facility]. Patient explained she has already filled her Rx yesterday and everything is okay. Pt verbalized understanding. Pt had no questions at this time but was encouraged to call back if questions arise.

## 2022-11-14 NOTE — Telephone Encounter (Signed)
[employer] medicare Josem Kaufmann: [id number] exp. 11/14/22-12/13/22 sent to [place]

## 2022-11-16 ENCOUNTER — Ambulatory Visit (HOSPITAL_BASED_OUTPATIENT_CLINIC_OR_DEPARTMENT_OTHER)
Admission: RE | Admit: 2022-11-16 | Discharge: 2022-11-16 | Disposition: A | Discharge status: Home / Self Care | Payer: Medicare Other | Source: Ambulatory Visit | Attending: Adult Health | Admitting: Adult Health

## 2022-11-16 DIAGNOSIS — R42 Dizziness and giddiness: Secondary | ICD-10-CM | POA: Diagnosis not present

## 2022-11-16 DIAGNOSIS — R413 Other amnesia: Secondary | ICD-10-CM | POA: Insufficient documentation

## 2022-11-16 DIAGNOSIS — I639 Cerebral infarction, unspecified: Secondary | ICD-10-CM | POA: Diagnosis not present

## 2022-11-18 DIAGNOSIS — E162 Hypoglycemia, unspecified: Secondary | ICD-10-CM | POA: Diagnosis not present

## 2022-11-18 DIAGNOSIS — I10 Essential (primary) hypertension: Secondary | ICD-10-CM | POA: Diagnosis not present

## 2022-11-18 DIAGNOSIS — I509 Heart failure, unspecified: Secondary | ICD-10-CM | POA: Diagnosis not present

## 2022-11-18 DIAGNOSIS — R945 Abnormal results of liver function studies: Secondary | ICD-10-CM | POA: Diagnosis not present

## 2022-11-18 DIAGNOSIS — E785 Hyperlipidemia, unspecified: Secondary | ICD-10-CM | POA: Diagnosis not present

## 2022-11-18 DIAGNOSIS — K5900 Constipation, unspecified: Secondary | ICD-10-CM | POA: Diagnosis not present

## 2022-11-18 DIAGNOSIS — R42 Dizziness and giddiness: Secondary | ICD-10-CM | POA: Diagnosis not present

## 2022-11-18 DIAGNOSIS — I700 Atherosclerosis of aorta: Secondary | ICD-10-CM | POA: Diagnosis not present

## 2022-11-19 ENCOUNTER — Telehealth: Payer: Self-pay | Admitting: Adult Health

## 2022-11-19 DIAGNOSIS — G4733 Obstructive sleep apnea (adult) (pediatric): Secondary | ICD-10-CM | POA: Diagnosis not present

## 2022-11-19 NOTE — Telephone Encounter (Signed)
Returned call to pt who stated that the side effects of the topirimate including fatigue , lightheadedness, dizziness are to much for her and she would like to know if there are any other medications that would be less likely to cause side effects.   Routing to provider to advise.

## 2022-11-19 NOTE — Telephone Encounter (Signed)
Pt called wanting to ask if she can discuss with RN or MD about a possible medicine change on her topiramate (TOPAMAX) 25 MG tablet for her Migraines

## 2022-11-22 ENCOUNTER — Encounter: Payer: Self-pay | Admitting: Adult Health

## 2022-11-28 MED ORDER — ZONISAMIDE 50 MG PO CAPS: 50.0000 mg | ORAL_CAPSULE | Freq: Every day | ORAL | 6 refills | Status: DC

## 2022-11-28 NOTE — Telephone Encounter (Signed)
She can try switching to Zonisamide. This medication works in a similar way to Topamax but tends to have fewer side effects. I sent an rx to her pharmacy. She can stop the Topamax and switch directly over to the zonisamide

## 2022-12-02 ENCOUNTER — Ambulatory Visit (INDEPENDENT_AMBULATORY_CARE_PROVIDER_SITE_OTHER): Payer: Medicare Other

## 2022-12-02 DIAGNOSIS — I639 Cerebral infarction, unspecified: Secondary | ICD-10-CM | POA: Diagnosis not present

## 2022-12-02 LAB — CUP PACEART REMOTE DEVICE CHECK
Date Time Interrogation Session: 20240414230155
Implantable Pulse Generator Implant Date: 20220517

## 2022-12-03 DIAGNOSIS — G43809 Other migraine, not intractable, without status migrainosus: Secondary | ICD-10-CM | POA: Diagnosis not present

## 2022-12-03 DIAGNOSIS — H8112 Benign paroxysmal vertigo, left ear: Secondary | ICD-10-CM | POA: Diagnosis not present

## 2022-12-03 DIAGNOSIS — H9203 Otalgia, bilateral: Secondary | ICD-10-CM | POA: Diagnosis not present

## 2022-12-04 NOTE — Progress Notes (Signed)
Carelink Summary Report / Loop Recorder 

## 2022-12-12 DIAGNOSIS — R945 Abnormal results of liver function studies: Secondary | ICD-10-CM | POA: Diagnosis not present

## 2023-01-03 NOTE — Progress Notes (Signed)
Carelink Summary Report / Loop Recorder 

## 2023-01-06 ENCOUNTER — Ambulatory Visit (INDEPENDENT_AMBULATORY_CARE_PROVIDER_SITE_OTHER): Payer: Medicare Other

## 2023-01-06 DIAGNOSIS — I639 Cerebral infarction, unspecified: Secondary | ICD-10-CM | POA: Diagnosis not present

## 2023-01-06 LAB — CUP PACEART REMOTE DEVICE CHECK
Date Time Interrogation Session: 20240517230141
Implantable Pulse Generator Implant Date: 20220517

## 2023-01-31 NOTE — Progress Notes (Signed)
Carelink Summary Report / Loop Recorder 

## 2023-02-05 DIAGNOSIS — K080 Exfoliation of teeth due to systemic causes: Secondary | ICD-10-CM | POA: Diagnosis not present

## 2023-02-06 ENCOUNTER — Ambulatory Visit (INDEPENDENT_AMBULATORY_CARE_PROVIDER_SITE_OTHER): Payer: Medicare Other

## 2023-02-06 DIAGNOSIS — I639 Cerebral infarction, unspecified: Secondary | ICD-10-CM

## 2023-02-06 LAB — CUP PACEART REMOTE DEVICE CHECK
Date Time Interrogation Session: 20240619230117
Implantable Pulse Generator Implant Date: 20220517

## 2023-02-10 DIAGNOSIS — K080 Exfoliation of teeth due to systemic causes: Secondary | ICD-10-CM | POA: Diagnosis not present

## 2023-02-24 DIAGNOSIS — G4733 Obstructive sleep apnea (adult) (pediatric): Secondary | ICD-10-CM | POA: Diagnosis not present

## 2023-02-26 NOTE — Progress Notes (Signed)
Carelink Summary Report / Loop Recorder 

## 2023-03-11 ENCOUNTER — Ambulatory Visit (INDEPENDENT_AMBULATORY_CARE_PROVIDER_SITE_OTHER): Payer: Medicare Other

## 2023-03-11 DIAGNOSIS — I639 Cerebral infarction, unspecified: Secondary | ICD-10-CM | POA: Diagnosis not present

## 2023-03-12 LAB — CUP PACEART REMOTE DEVICE CHECK
Date Time Interrogation Session: 20240722230510
Implantable Pulse Generator Implant Date: 20220517

## 2023-03-26 NOTE — Progress Notes (Signed)
Carelink Summary Report / Loop Recorder 

## 2023-04-13 LAB — CUP PACEART REMOTE DEVICE CHECK
Date Time Interrogation Session: 20240824230021
Implantable Pulse Generator Implant Date: 20220517

## 2023-04-14 ENCOUNTER — Ambulatory Visit (INDEPENDENT_AMBULATORY_CARE_PROVIDER_SITE_OTHER): Payer: Medicare Other

## 2023-04-14 DIAGNOSIS — I639 Cerebral infarction, unspecified: Secondary | ICD-10-CM

## 2023-04-23 NOTE — Progress Notes (Signed)
Carelink Summary Report / Loop Recorder 

## 2023-04-24 DIAGNOSIS — D164 Benign neoplasm of bones of skull and face: Secondary | ICD-10-CM | POA: Diagnosis not present

## 2023-04-24 DIAGNOSIS — Z85828 Personal history of other malignant neoplasm of skin: Secondary | ICD-10-CM | POA: Diagnosis not present

## 2023-04-24 DIAGNOSIS — L821 Other seborrheic keratosis: Secondary | ICD-10-CM | POA: Diagnosis not present

## 2023-04-24 DIAGNOSIS — L570 Actinic keratosis: Secondary | ICD-10-CM | POA: Diagnosis not present

## 2023-04-24 DIAGNOSIS — I781 Nevus, non-neoplastic: Secondary | ICD-10-CM | POA: Diagnosis not present

## 2023-05-14 ENCOUNTER — Encounter: Payer: Self-pay | Admitting: Adult Health

## 2023-05-14 ENCOUNTER — Ambulatory Visit: Payer: Medicare Other | Admitting: Adult Health

## 2023-05-14 VITALS — BP 117/76 | HR 80 | Ht 64.0 in | Wt 172.0 lb

## 2023-05-14 DIAGNOSIS — G43E09 Chronic migraine with aura, not intractable, without status migrainosus: Secondary | ICD-10-CM

## 2023-05-14 DIAGNOSIS — I639 Cerebral infarction, unspecified: Secondary | ICD-10-CM | POA: Diagnosis not present

## 2023-05-14 DIAGNOSIS — G4733 Obstructive sleep apnea (adult) (pediatric): Secondary | ICD-10-CM | POA: Diagnosis not present

## 2023-05-14 DIAGNOSIS — H8110 Benign paroxysmal vertigo, unspecified ear: Secondary | ICD-10-CM

## 2023-05-14 MED ORDER — ZONISAMIDE 50 MG PO CAPS: 100.0000 mg | ORAL_CAPSULE | Freq: Every day | ORAL | 6 refills | Status: DC

## 2023-05-14 NOTE — Patient Instructions (Addendum)
Your Plan:  Increase zonisamide to 100mg  nightly - please let me know if any difficulty tolerating or no benefit   Please let me know if you would like to try any rescue medication such as Nurtec or Ubrelvy. Can continue taking Tylenol as this time. Would avoid use of ibuprofen due to increased bleeding risk  Continue Epley maneuver as needed for vertigo - if symptoms worsen, can consider doing vestibular therapy  Continue nightly use of CPAP for sleep apnea management  Continue aspirin and atorvastatin for stroke prevention. Continue routine follow up with PCP for stroke risk factor management.      Follow up in 6 months or call earlier if needed       Thank you for coming to see [place] at [facility]. I hope we have been able to provide you high quality care today.  You may receive a patient satisfaction survey over the next few weeks. We would appreciate your feedback and comments so that we may continue to improve ourselves and the health of our patients.

## 2023-05-14 NOTE — Progress Notes (Signed)
[facility] [address] [place] [address] [phone]       OFFICE FOLLOW UP NOTE  [name] Date of Birth:  02/05/1951 Medical Record Number:  [id number]   Reason for visit: OSA on CPAP, hx of stroke    SUBJECTIVE:   CHIEF COMPLAINT:  Chief Complaint  Patient presents with   Follow-up    Pt with husband, rm 8. Over the last 2 weeks she has been having daily migraines. She woke up with one today. She has been having the same vision concerns with the headaches her eyes have jumpiness. Dizziness still occurring and sometimes she will do the epley maneuver and that helps at times.  She has had her yearly eye MD apt and that went well     HPI:   Update 05/14/2023 JM: Patient returns for follow-up visit accompanied by her husband.  Patient sent MyChart message during the interval time complaining of topiramate side effects, was switched to zonisamide 50 mg nightly. Reports initially migraines well-controlled but over the past 2 weeks, she has had daily migraine headaches. Will use ibuprofen a couple times per week with benefit at times. Can have some dizziness associated with headaches.   Previously c/o worsening BPPV symptoms, evaluated by ENT, provided information for Epley maneuver, declined vestibular therapy.  Repeat MRI unremarkable. Currently intermittent vertigo, usually resolves with Epley maneuver  Prior memory concerns overall stable, denies worsening, possibly some improvement per patient. Per husband, can fluctuate   Denies new stroke/TIA symptoms.  Compliant on medications.  Followed by PCP for stroke risk factor management.  Loop recorder has not shown atrial fibrillation thus far.  Past 30-day CPAP compliance report shows excellent usage with 100% daily and >4 hr usage, average usage 7 hours and 46 minutes.  Residual AHI 2.6 on set pressure of 6 with EPR 3.  Leaks in the 95 percentile 11.5.  Reports tolerating CPAP well.   Routinely follows with DME and up to date on supplies.        History provided for reference purposes only Update 11/11/2022 JM: Patient returns for 51-month follow-up accompanied by her husband.  OSA on CPAP: Compliance report over the past 30 days shows 30 out of 30 usage days with 30 days greater than 4 hours for 100% compliance.  Residual AHI 2.7.  Set pressure 6 with EPR 3.  Leaks in the 95th percentile 1.4. still feels like she doesn't sleep good. She feels tired. Still wakes up with headache which gets better as she gets up and moving (different from her typical migraine headaches). She questions if headaches are due to facial mask and straps, feels current use of nasal mask uncomfortable and causes redness on bridge of nose. She has tried to f/u with DME [facility] regarding this, was supposed to get new padding for nasal mask but been having great difficulty obtaining.  She is interested in other mask types.   Patient called office 3/4 c/o worsening migraines with visual aura and topiramate increased to 50 mg twice daily with improvement, did have recent ocular symptoms this past weekend which did not progress to migraine headache.  She is tolerating increased dose of topiramate without difficulty.  She also mentions ongoing concerns with dizziness/vertigo.  Was seen by ENT back in December for BPPV, worked with PT in January and February but per patient, unable to trigger vertigo symptoms therefore was released. She feels like dizziness symptoms have been more persistent since then. She describes symptoms  as a swimmy headedness or lightheadedness as well as room spinning sensation.  Unable to identify triggers.  She also complains of inner ear pain and feels symptoms worse when ears feel clogged, increased draining or will have sensation as though something is moving inside of her ears.  She questions more in-depth evaluation of inner ear.  Husband is greatly concerned regarding these  dizziness symptoms as well as her occasionally looking pale and fatigued.  He also mentions concerns with her memory, gives example that they are trying to consolidate bills, she has been struggling with this over the past month, feels overwhelmed, will come up with a plan but then will forget the plan the next day. No other issues with her memory, continues to maintain ADLs and all other IADLs independently. She does not drive often due to dizziness.   Compliant on aspirin and atorvastatin.  Blood pressure well-controlled.  Loop recorder has not shown atrial fibrillation thus far.  Prior follow-up visit with PCP back in January.    Update 06/12/2022 JM: Patient returns for initial CPAP compliance visit.  Previously seen 2 months ago for stroke follow-up and overall stable at that time.  Completed sleep study back in July which showed severe OSA and initiated AutoPap 7/28.  Review of compliance report over the past 30 days shows 30 out of 30 usage days and 30 days greater than 4 hours for 100% compliance.  Average usage 8 hours and 2 minutes.  Residual AHI 3.3 on set pressure of 6.  Leaks in the 95 percentile 0.2.  She reports tolerating CPAP well.  She is scheduled to follow-up with her DME company on Monday due to some irritation around the bridge of her nose with current mask use.  She otherwise has no specific concerns.  Epworth Sleepiness Scale 11/24 (prior to CPAP 13/24)  Remains stable from stroke standpoint without new stroke/TIA symptoms.  Reports improvement of headaches since initiating topiramate 25 mg twice daily, denies side effects.  Compliant on aspirin and atorvastatin.  Blood pressure well controlled.  Loop recorder has not shown atrial fibrillation thus far.   Update 03/26/2022 JM: Patient returns for stroke follow-up after prior visit 4 months ago.  Overall stable without new stroke/TIA symptoms.  Reports residual left sided upper visual impairment but believes it has improved since  prior visit.  Compliant on aspirin atorvastatin, denies side effects.  Blood pressure today 124/71.  Loop recorder has not shown atrial fibrillation thus far.  She continues to complain of mild headaches usually about 3 times per week and will resolve after eating.  No changes in frequency or characteristics since prior visit.  Headaches are not debilitating or interfere with activity.  Reports great improvement of tremor since eliminating GERD medication, she has since restarted Nexium due to worsening reflux without any issues.  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   Evaluated by Dr. Frances Furbish and underwent PSG 7/11 which showed severe OSA with AHI 51/h and initiated AutoPap 7/28.  She denies any significant difficulty tolerating.  Has initial CPAP follow-up visit scheduled 10/25.  No further concerns at this time  Update 11/22/2021 JM: Patient returns for 34-month stroke follow-up accompanied by her husband.  Overall stable from stroke standpoint. Reports continued visual concerns, same as prior visit.  Was recently seen in ED on 3/13 for 3-4 wk onset of dizziness and worsening headaches, MR brain negative, noted exam consistent with peripheral vertigo and advised outpatient ENT follow-up.  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  Information for Epley maneuver provided, declined vestibular therapy. She was doing epley maneuver routinely at home but has not been doing them as routinely over the past week and has noticed worsening of vertigo - she questions if she is doing them correctly.  Continues to experience almost daily migraine headaches since onset of vertigo, typically triggered by hunger and feeling overheated.  Will take Tylenol and eat and will usually help.  She has never been treated for migraine headaches but does have history of migraines.  She also notes left > right arm shaking since her stroke and has been progressively worsening, typically  worse in the morning and gets better throughout the day.  Does not interfere with activity.  Denies any family history of tremors or Parkinson's.  Does endorse daytime fatigue, at times insomnia, snores, occasional morning headaches and memory loss. Has not previously underwent sleep study - declined further evaluation at prior visits.   Compliant on aspirin and atorvastatin, denies side effects.  Blood pressure today 125/76.  Loop recorder has not shown atrial fibrillation thus far.  No further concerns at this time.   Update 05/23/2021 JM: Overall stable.  Continued left peripheral vision impairment - stable. Followed by ophthalmology (prior visit in Aug - has f/u 11/23).  Reports worsening blurred vision close up and issues with floaters (not a new issue).  Prior complaints of mild frontal headache with some improvement still after looking at a computer for prolonged period of time or with hunger. Continued fatigue with activity - some improvement since prior visit. Continues to maintain all ADLs and IADLs independently as well as driving without difficulty.  Denies new stroke/TIA symptoms.  S/p successful PFO closure 03/09/2021 with Dr. Excell Seltzer.  Currently maintaining aspirin and Plavix tolerating without side effects for total of 64-month duration.  Compliant on atorvastatin without associated side effects.  Blood pressure today 146/92. Has not been recently monitored at home as previously stable.  Loop recorder has not shown atrial fibrillation thus far. PCP recently completed lab work which was satisfactory (unable to view via epic).  No further concerns at this time.   Initial visit 02/14/2021 JM: Ms. Harrier is being seen for hospital follow-up unaccompanied.  She has been doing well since discharge without new stroke/TIA symptoms.  She does report residual left peripheral field blurred vision as well as fatigue and some short-term memory loss since her stroke.  She has returned back to all prior  activities including driving and working.  She will occasionally experience a frontal headache after prolonged eyestrain and looking at the computer during work but will typically resolve on own or with Tylenol.  She has not experienced any additional migraines or visual auras.  She did have mild memory loss prior to her stroke but more likely age-related.  She has noticed herself fatiguing quicker with normal activities throughout the day -she did not have any fatigue concerns previously.  Completed 3 weeks DAPT and remains on aspirin alone as well as atorvastatin without associated side effects.  Blood pressure today 131/82.  Loop recorder has not shown atrial fibrillation thus far. Per reivew of epic, Dr. Excell Seltzer further reviewed TEE with definite PFO with color and verbal confirmation moderate size and has scheduled evaluation 7/6 to further discuss possible closure.  No further concerns at this time.    Stroke admission 12/31/2020 Yolanda Gibbs is a 72 y.o. female with a history of hypertension and migraine  who presented on 12/31/2020 with 1 week history of visual changes describing and zigzag lines more prominent in the left.  Personally reviewed hospitalization pertinent progress notes, lab work and imaging with summary provided.  She initially was seen by her optometrist with no abnormality noted.  Was seen by PCP due to elevated BP no started on losartan but symptoms did not improve.  She eventually sought care at [facility] for CT head showed subacute occipital stroke and transferred to [facility].  Evaluated by Dr. Roda Shutters with right occipital stroke likely due to migraine vs cardioembolic source.  TEE showed small PFO evaluated by cardiology Dr. Excell Seltzer for possible closure although low suspicion stroke etiology given size and age.  Placement of loop recorder for possible atrial fibrillation and stroke etiology.  Recommended DAPT for 3 weeks and aspirin HTN stable.  LDL 89 and started atorvastatin 40 mg  daily.  Other stroke risk factors include advanced age, current EtOH use (3-4 drinks/week), family history of stroke and ocular migraines.  Residual subjective left visual field blurred vision (unable to appreciate visual field deficit on exam).  Evaluated by therapies and discharged home in stable condition without therapy needs.  Stroke - right occipital small stroke likely due to migraine versus cardioembolic source. CT - Focal area of decreased attenuation in the posterior aspect of the mid right occipital lobe, felt to represent a recent and potential acute focal infarct in this area MRI: right occipital small infarct MRA head and neck: negative 2D Echo: EF 60-65%, mild LVH LE venous Doppler negative for DVT VTE prophylaxis - SCDs, Lovenox TEE: Positive bubble study but negative with color doppler, consistent with small PFO Loop recorder implant (01/02/2021) A1c 6.2 LDL 89 On DAPT with plavix and ASA x 3 weeks then ASA alone. Therapy recommendations: Cleared for home by PT/OT with no needs Disposition:  Home         ROS:   14 system review of systems performed and negative with exception of those listed in HPI  PMH:  Past Medical History:  Diagnosis Date   Allergy    seasonal   Depression    GERD (gastroesophageal reflux disease)    History of chicken pox    History of colon polyps    benign   Hypertension    TIA (transient ischemic attack)     PSH:  Past Surgical History:  Procedure Laterality Date   BUBBLE STUDY  01/02/2021   Procedure: BUBBLE STUDY;  Surgeon: [name], MD;  Location: [facility] ENDOSCOPY;  Service: Cardiovascular;;   LOOP RECORDER INSERTION N/A 01/02/2021   Procedure: LOOP RECORDER INSERTION;  Surgeon: Thurmon Fair, MD;  Location: [facility] INVASIVE CV LAB;  Service: Cardiovascular;  Laterality: N/A;   PATENT FORAMEN OVALE(PFO) CLOSURE N/A 03/09/2021   Procedure: PATENT FORAMEN OVALE (PFO) CLOSURE;  Surgeon: Tonny Bollman, MD;  Location: [facility]  INVASIVE CV LAB;  Service: Cardiovascular;  Laterality: N/A;   TEE WITHOUT CARDIOVERSION N/A 01/02/2021   Procedure: TRANSESOPHAGEAL ECHOCARDIOGRAM (TEE);  Surgeon: [name], MD;  Location: [facility] ENDOSCOPY;  Service: Cardiovascular;  Laterality: N/A;    Social History:  Social History   Socioeconomic History   Marital status: Married    Spouse name: Not on file   Number of children: Not on file   Years of education: Not on file   Highest education level: Not on file  Occupational History   Not on file  Tobacco Use   Smoking status: Never   Smokeless tobacco:  Never  Vaping Use   Vaping status: Never Used  Substance and Sexual Activity   Alcohol use: Yes    Alcohol/week: 3.0 - 4.0 standard drinks of alcohol    Types: 3 - 4 Standard drinks or equivalent per week    Comment: occasional   Drug use: No   Sexual activity: Not on file  Other Topics Concern   Not on file  Social History Narrative   Daughter at Haroldine Laws (Julian Hy) born 51   Daughter Erin 51   Husband is a Curator, has a Forensic scientist, has a Landscape architect helps with day to day of the business   Second marriage- prior to remarrying worked in Passenger transport manager.   Completed college   Enjoys watching old movies, needlework, cleaning her house   No pets.    Caffiene occasional 12 oz Dr Reino Kent,  coffee on weekends.   Social Determinants of Health   Financial Resource Strain: Not on file  Food Insecurity: Not on file  Transportation Needs: Not on file  Physical Activity: Not on file  Stress: Not on file  Social Connections: Not on file  Intimate Partner Violence: Not on file    Family History:  Family History  Problem Relation Age of Onset   Arthritis Mother    CAD Mother    AAA (abdominal aortic aneurysm) Mother    Cancer Mother        lung   Stroke Father    Cancer Maternal Aunt        breast   Arthritis Maternal Grandmother    Cancer Maternal Grandfather        prostate    Depression Paternal Grandmother     Medications:   Current Outpatient Medications on File Prior to Visit  Medication Sig Dispense Refill   aspirin EC 81 MG EC tablet Take 1 tablet (81 mg total) by mouth daily. Swallow whole. 30 tablet 1   atorvastatin (LIPITOR) 40 MG tablet Take 1 tablet (40 mg total) by mouth daily. 30 tablet 1   Calcium-Phosphorus-Vitamin D (CITRACAL +D3 PO) Take 1 tablet by mouth daily. Vit d 500 IU, calcium 630mg  each     Carboxymethylcellulose Sod PF 0.5 % SOLN Place 1 drop into both eyes in the morning and at bedtime.     diazepam (VALIUM) 2 MG tablet Take 1 tablet (2 mg total) by mouth as needed for anxiety (Prior to MRI). Take 30-60 min prior to MRI. Can repeat x1 if needed 2 tablet 0   Docusate Sodium 100 MG capsule Take 200 mg by mouth at bedtime.     esomeprazole (NEXIUM) 20 MG capsule Take 40 mg by mouth daily at 12 noon.     losartan (COZAAR) 100 MG tablet Take 100 mg by mouth daily.     Multiple Vitamins-Minerals (MULTIVITAMIN ADULTS 50+) TABS Take 1 tablet by mouth daily.     venlafaxine XR (EFFEXOR-XR) 75 MG 24 hr capsule TAKE 1 CAPSULE BY MOUTH DAILY WITH BREAKFAST 90 capsule 0   zonisamide (ZONEGRAN) 50 MG capsule Take 1 capsule (50 mg total) by mouth daily. 30 capsule 6   No current facility-administered medications on file prior to visit.    Allergies:   Allergies  Allergen Reactions   Darvon [Propoxyphene] Other (See Comments)    "Slept for 3 days"   Duloxetine     Added from [facility] [place]- patient is unsure       OBJECTIVE:  Physical Exam  Vitals:   05/14/23  1440  BP: 117/76  Pulse: 80  Weight: 172 lb (78 kg)  Height: 5\' 4"  (1.626 m)    Body mass index is 29.52 kg/m. No results found.   General: well developed, well nourished, very pleasant middle-age Caucasian female, seated, in no evident distress Head: head normocephalic and atraumatic.   Neck: supple with no carotid or supraclavicular bruits Cardiovascular: regular rate  and rhythm, no murmurs Musculoskeletal: no deformity Skin:  no rash/petichiae Vascular:  Normal pulses all extremities   Neurologic Exam Mental Status: Awake and fully alert. Fluent speech and language.  Oriented to place and time. Recent and remote memory intact. Attention span, concentration and fund of knowledge appropriate. Mood and affect appropriate.  Cranial Nerves: Pupils equal, briskly reactive to light. Extraocular movements full without nystagmus. Visual fields full to confrontation.  Hearing intact. Facial sensation intact. Face, tongue, palate moves normally and symmetrically.  Motor: Normal bulk and tone. Normal strength in all tested extremity muscles.  Sensory.: intact to touch , pinprick , position and vibratory sensation.  Coordination: Rapid alternating movements normal in all extremities. Finger-to-nose and heel-to-shin performed accurately bilaterally Gait and Station: Arises from chair without difficulty. Stance is normal. Gait demonstrates normal stride length and balance without use of assistive device.  Reflexes: 1+ and symmetric. Toes downgoing.         ASSESSMENT: Yolanda Gibbs is a 73 y.o. year old female with right occipital stroke embolic secondary to unknown source on 12/31/2020 s/p ILR. Vascular risk factors include HTN, HLD, small PFO, BPPV, advanced age, ocular migraines and severe OSA on CPAP.     PLAN:  OSA on CPAP: Compliance report shows satisfactory usage with optimal residual AHI.  Continue current pressure settings. Discussed continued nightly usage with ensuring greater than 4 hours nightly for optimal benefit and per insurance purposes.  Continue to follow with DME company for any needed supplies or CPAP related concerns  R occipital stroke, cryptogenic:  Loop recorder has not shown atrial fibrillation thus far  S/p PFO closure 02/2021 by Dr. Excell Seltzer, continue to follow with cardiology Continue aspirin 81 mg daily  and atorvastatin 40 mg  daily for secondary stroke prevention managed/monitored by PCP.   Discussed secondary stroke prevention measures and importance of close PCP follow up for aggressive stroke risk factor management including BP goal<130/90, and HLD with LDL goal<70   Migraine headaches:  Recent worsening possible due to weather change Increase zonisamide to 100mg  nightly. Advised to call with any difficulty tolerating Discussed avoidance of regular ibuprofen use, would recommend Tylenol as needed.  Declines interest in rescue therapy such as Nurtec or Ubrelvy right now, triptans contraindicated due to stroke history Intolerant to topiramate  BPPV: Intermittent symptoms - overall stable, Epley maneuver with benefit Repeat MRI 10/2022 no acute abnormalities or new findings compared to prior imaging, showed prior right occipital lobe infarct ENT eval discussed BPPV and Epley maneuver, declined vestibular therapy, questioned possible vestibular migraines If symptoms worsen, would recommend participating in vestibular therapy   Memory loss:  Overall stable if not improved, possible side effect from topiramate MRI brain 10/2022 no acute abnormalities, prior right occipital lobe infarct Lab work for reversible causes unremarkable      Follow-up in 6 months or call earlier if needed    CC:  PCP: Melvenia Beam, MD    I spent 40 minutes of face-to-face and non-face-to-face time with patient and husband.  This included previsit chart review, lab review, study review, electronic health record documentation,  and patient education and discussion regarding above diagnoses and treatment plan and answered all the questions to patient and husband's satisfaction  Ihor Austin, [facility]  [facility] [address] [place], [place] [address]  Phone [phone] Fax [phone] Note: This document was prepared with digital dictation and possible smart phrase technology. Any  transcriptional errors that result from this process are unintentional.

## 2023-05-16 DIAGNOSIS — G4733 Obstructive sleep apnea (adult) (pediatric): Secondary | ICD-10-CM | POA: Diagnosis not present

## 2023-05-16 LAB — CUP PACEART REMOTE DEVICE CHECK
Date Time Interrogation Session: 20240926230516
Implantable Pulse Generator Implant Date: 20220517

## 2023-05-19 ENCOUNTER — Ambulatory Visit (INDEPENDENT_AMBULATORY_CARE_PROVIDER_SITE_OTHER): Payer: Medicare Other

## 2023-05-19 DIAGNOSIS — I639 Cerebral infarction, unspecified: Secondary | ICD-10-CM | POA: Diagnosis not present

## 2023-06-02 NOTE — Progress Notes (Signed)
Carelink Summary Report / Loop Recorder 

## 2023-06-20 LAB — CUP PACEART REMOTE DEVICE CHECK
Date Time Interrogation Session: 20241029230402
Implantable Pulse Generator Implant Date: 20220517

## 2023-06-23 ENCOUNTER — Ambulatory Visit (INDEPENDENT_AMBULATORY_CARE_PROVIDER_SITE_OTHER): Payer: Medicare Other

## 2023-06-23 DIAGNOSIS — I639 Cerebral infarction, unspecified: Secondary | ICD-10-CM

## 2023-07-14 NOTE — Progress Notes (Signed)
Carelink Summary Report / Loop Recorder 

## 2023-07-24 DIAGNOSIS — D485 Neoplasm of uncertain behavior of skin: Secondary | ICD-10-CM | POA: Diagnosis not present

## 2023-07-24 DIAGNOSIS — L820 Inflamed seborrheic keratosis: Secondary | ICD-10-CM | POA: Diagnosis not present

## 2023-07-24 DIAGNOSIS — L570 Actinic keratosis: Secondary | ICD-10-CM | POA: Diagnosis not present

## 2023-07-28 ENCOUNTER — Ambulatory Visit (INDEPENDENT_AMBULATORY_CARE_PROVIDER_SITE_OTHER): Payer: Medicare Other

## 2023-07-28 DIAGNOSIS — I639 Cerebral infarction, unspecified: Secondary | ICD-10-CM

## 2023-07-28 LAB — CUP PACEART REMOTE DEVICE CHECK
Date Time Interrogation Session: 20241208230639
Implantable Pulse Generator Implant Date: 20220517

## 2023-08-06 DIAGNOSIS — H2513 Age-related nuclear cataract, bilateral: Secondary | ICD-10-CM | POA: Diagnosis not present

## 2023-08-06 DIAGNOSIS — G43109 Migraine with aura, not intractable, without status migrainosus: Secondary | ICD-10-CM | POA: Diagnosis not present

## 2023-08-06 DIAGNOSIS — H5203 Hypermetropia, bilateral: Secondary | ICD-10-CM | POA: Diagnosis not present

## 2023-08-06 DIAGNOSIS — H40013 Open angle with borderline findings, low risk, bilateral: Secondary | ICD-10-CM | POA: Diagnosis not present

## 2023-08-06 DIAGNOSIS — H524 Presbyopia: Secondary | ICD-10-CM | POA: Diagnosis not present

## 2023-08-06 DIAGNOSIS — H43813 Vitreous degeneration, bilateral: Secondary | ICD-10-CM | POA: Diagnosis not present

## 2023-09-01 ENCOUNTER — Ambulatory Visit (INDEPENDENT_AMBULATORY_CARE_PROVIDER_SITE_OTHER): Payer: Medicare Other

## 2023-09-01 DIAGNOSIS — I639 Cerebral infarction, unspecified: Secondary | ICD-10-CM

## 2023-09-01 DIAGNOSIS — K080 Exfoliation of teeth due to systemic causes: Secondary | ICD-10-CM | POA: Diagnosis not present

## 2023-09-01 LAB — CUP PACEART REMOTE DEVICE CHECK
Date Time Interrogation Session: 20250112230611
Implantable Pulse Generator Implant Date: 20220517

## 2023-09-30 DIAGNOSIS — L570 Actinic keratosis: Secondary | ICD-10-CM | POA: Diagnosis not present

## 2023-09-30 DIAGNOSIS — Z872 Personal history of diseases of the skin and subcutaneous tissue: Secondary | ICD-10-CM | POA: Diagnosis not present

## 2023-09-30 DIAGNOSIS — L814 Other melanin hyperpigmentation: Secondary | ICD-10-CM | POA: Diagnosis not present

## 2023-10-01 DIAGNOSIS — R42 Dizziness and giddiness: Secondary | ICD-10-CM | POA: Diagnosis not present

## 2023-10-01 DIAGNOSIS — J449 Chronic obstructive pulmonary disease, unspecified: Secondary | ICD-10-CM | POA: Diagnosis not present

## 2023-10-01 DIAGNOSIS — I700 Atherosclerosis of aorta: Secondary | ICD-10-CM | POA: Diagnosis not present

## 2023-10-01 DIAGNOSIS — F3342 Major depressive disorder, recurrent, in full remission: Secondary | ICD-10-CM | POA: Diagnosis not present

## 2023-10-01 DIAGNOSIS — R945 Abnormal results of liver function studies: Secondary | ICD-10-CM | POA: Diagnosis not present

## 2023-10-01 DIAGNOSIS — I509 Heart failure, unspecified: Secondary | ICD-10-CM | POA: Diagnosis not present

## 2023-10-01 DIAGNOSIS — E162 Hypoglycemia, unspecified: Secondary | ICD-10-CM | POA: Diagnosis not present

## 2023-10-01 LAB — LAB REPORT - SCANNED
A1c: 6.2
EGFR (Non-African Amer.): 94

## 2023-10-06 ENCOUNTER — Ambulatory Visit (INDEPENDENT_AMBULATORY_CARE_PROVIDER_SITE_OTHER): Payer: Medicare Other

## 2023-10-06 DIAGNOSIS — I639 Cerebral infarction, unspecified: Secondary | ICD-10-CM | POA: Diagnosis not present

## 2023-10-07 LAB — CUP PACEART REMOTE DEVICE CHECK
Date Time Interrogation Session: 20250216230523
Implantable Pulse Generator Implant Date: 20220517

## 2023-10-08 ENCOUNTER — Encounter: Payer: Self-pay | Admitting: Cardiovascular Disease

## 2023-10-08 DIAGNOSIS — G4733 Obstructive sleep apnea (adult) (pediatric): Secondary | ICD-10-CM | POA: Diagnosis not present

## 2023-10-13 NOTE — Progress Notes (Signed)
 Carelink Summary Report / Loop Recorder

## 2023-11-10 ENCOUNTER — Ambulatory Visit (INDEPENDENT_AMBULATORY_CARE_PROVIDER_SITE_OTHER): Payer: Medicare Other

## 2023-11-10 DIAGNOSIS — I639 Cerebral infarction, unspecified: Secondary | ICD-10-CM

## 2023-11-10 LAB — CUP PACEART REMOTE DEVICE CHECK
Date Time Interrogation Session: 20250323230512
Implantable Pulse Generator Implant Date: 20220517

## 2023-11-11 NOTE — Addendum Note (Signed)
 Addended by: Geralyn Flash D on: 11/11/2023 03:38 PM   Modules accepted: Orders

## 2023-11-11 NOTE — Progress Notes (Signed)
 Carelink Summary Report / Loop Recorder

## 2023-11-26 NOTE — Progress Notes (Unsigned)
 Yolanda Gibbs

## 2023-11-27 ENCOUNTER — Encounter: Payer: Self-pay | Admitting: Adult Health

## 2023-11-27 ENCOUNTER — Ambulatory Visit: Payer: Medicare Other | Admitting: Adult Health

## 2023-11-27 VITALS — BP 130/79 | HR 85 | Ht 64.0 in | Wt 175.0 lb

## 2023-11-27 DIAGNOSIS — I639 Cerebral infarction, unspecified: Secondary | ICD-10-CM

## 2023-11-27 DIAGNOSIS — G3184 Mild cognitive impairment, so stated: Secondary | ICD-10-CM | POA: Diagnosis not present

## 2023-11-27 DIAGNOSIS — G4719 Other hypersomnia: Secondary | ICD-10-CM

## 2023-11-27 DIAGNOSIS — R251 Tremor, unspecified: Secondary | ICD-10-CM | POA: Diagnosis not present

## 2023-11-27 DIAGNOSIS — E559 Vitamin D deficiency, unspecified: Secondary | ICD-10-CM | POA: Diagnosis not present

## 2023-11-27 DIAGNOSIS — E538 Deficiency of other specified B group vitamins: Secondary | ICD-10-CM | POA: Diagnosis not present

## 2023-11-27 DIAGNOSIS — G4733 Obstructive sleep apnea (adult) (pediatric): Secondary | ICD-10-CM

## 2023-11-27 DIAGNOSIS — G43E09 Chronic migraine with aura, not intractable, without status migrainosus: Secondary | ICD-10-CM

## 2023-11-27 MED ORDER — VENLAFAXINE HCL ER 150 MG PO CP24: 150.0000 mg | ORAL_CAPSULE | Freq: Every day | ORAL | 5 refills | Status: DC

## 2023-11-27 NOTE — Patient Instructions (Addendum)
 Your Plan:  Increase Effexor to 150mg  daily for further headache management   Please call after 1 month if migraines persist  We will check lab work today and you will be notified via MyChart regarding results  Continue to monitor tremor and gait. If this worsens prior to next visit, please let me know  Reestablish care with Sandford Craze, NP next month   Continue nightly use of CPAP for adequate sleep apnea management     Follow-up in 6 months or call earlier if needed      Thank you for coming to see [place] at [facility]. I hope we have been able to provide you high quality care today.  You may receive a patient satisfaction survey over the next few weeks. We would appreciate your feedback and comments so that we may continue to improve ourselves and the health of our patients.

## 2023-11-28 ENCOUNTER — Encounter: Payer: Self-pay | Admitting: Adult Health

## 2023-11-28 LAB — CBC WITH DIFFERENTIAL/PLATELET
Basophils Absolute: 0.1 10*3/uL (ref 0.0–0.2)
Basos: 1 %
EOS (ABSOLUTE): 0.3 10*3/uL (ref 0.0–0.4)
Eos: 6 %
Hematocrit: 42.5 % (ref 34.0–46.6)
Hemoglobin: 13.9 g/dL (ref 11.1–15.9)
Immature Grans (Abs): 0 10*3/uL (ref 0.0–0.1)
Immature Granulocytes: 0 %
Lymphocytes Absolute: 1.9 10*3/uL (ref 0.7–3.1)
Lymphs: 33 %
MCH: 30.8 pg (ref 26.6–33.0)
MCHC: 32.7 g/dL (ref 31.5–35.7)
MCV: 94 fL (ref 79–97)
Monocytes Absolute: 0.5 10*3/uL (ref 0.1–0.9)
Monocytes: 9 %
Neutrophils Absolute: 2.8 10*3/uL (ref 1.4–7.0)
Neutrophils: 51 %
Platelets: 340 10*3/uL (ref 150–450)
RBC: 4.52 x10E6/uL (ref 3.77–5.28)
RDW: 12.8 % (ref 11.7–15.4)
WBC: 5.6 10*3/uL (ref 3.4–10.8)

## 2023-11-28 LAB — COMPREHENSIVE METABOLIC PANEL WITH GFR
ALT: 32 IU/L (ref 0–32)
AST: 27 IU/L (ref 0–40)
Albumin: 4.5 g/dL (ref 3.9–4.9)
Alkaline Phosphatase: 139 IU/L — ABNORMAL HIGH (ref 44–121)
BUN/Creatinine Ratio: 22 (ref 12–28)
BUN: 17 mg/dL (ref 8–27)
Bilirubin Total: <0.2 mg/dL (ref 0.0–1.2)
CO2: 26 mmol/L (ref 20–29)
Calcium: 10.2 mg/dL (ref 8.7–10.3)
Chloride: 102 mmol/L (ref 96–106)
Creatinine, Ser: 0.76 mg/dL (ref 0.57–1.00)
Globulin, Total: 2.3 g/dL (ref 1.5–4.5)
Glucose: 103 mg/dL — ABNORMAL HIGH (ref 70–99)
Potassium: 4.6 mmol/L (ref 3.5–5.2)
Sodium: 141 mmol/L (ref 134–144)
Total Protein: 6.8 g/dL (ref 6.0–8.5)
eGFR: 84 mL/min/{1.73_m2} (ref >59)

## 2023-11-28 LAB — VITAMIN B12: Vitamin B-12: 1077 pg/mL (ref 232–1245)

## 2023-11-28 LAB — VITAMIN D 25 HYDROXY (VIT D DEFICIENCY, FRACTURES): Vit D, 25-Hydroxy: 30.6 ng/mL (ref 30.0–100.0)

## 2023-11-28 LAB — TSH: TSH: 1.51 u[IU]/mL (ref 0.450–4.500)

## 2023-12-06 ENCOUNTER — Other Ambulatory Visit: Payer: Self-pay | Admitting: Adult Health

## 2023-12-08 DIAGNOSIS — H40013 Open angle with borderline findings, low risk, bilateral: Secondary | ICD-10-CM | POA: Diagnosis not present

## 2023-12-08 DIAGNOSIS — H2513 Age-related nuclear cataract, bilateral: Secondary | ICD-10-CM | POA: Diagnosis not present

## 2023-12-08 DIAGNOSIS — H04123 Dry eye syndrome of bilateral lacrimal glands: Secondary | ICD-10-CM | POA: Diagnosis not present

## 2023-12-08 DIAGNOSIS — G43109 Migraine with aura, not intractable, without status migrainosus: Secondary | ICD-10-CM | POA: Diagnosis not present

## 2023-12-15 ENCOUNTER — Ambulatory Visit: Payer: Medicare Other

## 2023-12-15 DIAGNOSIS — I639 Cerebral infarction, unspecified: Secondary | ICD-10-CM

## 2023-12-15 LAB — CUP PACEART REMOTE DEVICE CHECK
Date Time Interrogation Session: 20250427230704
Implantable Pulse Generator Implant Date: 20220517

## 2023-12-16 DIAGNOSIS — Z1231 Encounter for screening mammogram for malignant neoplasm of breast: Secondary | ICD-10-CM | POA: Diagnosis not present

## 2023-12-16 LAB — HM MAMMOGRAPHY

## 2023-12-20 ENCOUNTER — Encounter: Payer: Self-pay | Admitting: Cardiovascular Disease

## 2023-12-24 ENCOUNTER — Encounter (HOSPITAL_COMMUNITY): Payer: Self-pay

## 2023-12-26 NOTE — Progress Notes (Signed)
 Carelink Summary Report / Loop Recorder

## 2023-12-31 ENCOUNTER — Ambulatory Visit: Admitting: Family

## 2023-12-31 ENCOUNTER — Encounter: Payer: Self-pay | Admitting: Family

## 2023-12-31 ENCOUNTER — Telehealth: Payer: Self-pay | Admitting: Family

## 2023-12-31 VITALS — BP 118/69 | HR 87 | Temp 99.5°F | Resp 16 | Ht 64.0 in | Wt 171.8 lb

## 2023-12-31 DIAGNOSIS — G4733 Obstructive sleep apnea (adult) (pediatric): Secondary | ICD-10-CM

## 2023-12-31 DIAGNOSIS — I639 Cerebral infarction, unspecified: Secondary | ICD-10-CM

## 2023-12-31 DIAGNOSIS — E785 Hyperlipidemia, unspecified: Secondary | ICD-10-CM

## 2023-12-31 DIAGNOSIS — R739 Hyperglycemia, unspecified: Secondary | ICD-10-CM | POA: Diagnosis not present

## 2023-12-31 DIAGNOSIS — R42 Dizziness and giddiness: Secondary | ICD-10-CM

## 2023-12-31 DIAGNOSIS — Q2112 Patent foramen ovale: Secondary | ICD-10-CM

## 2023-12-31 DIAGNOSIS — Z0000 Encounter for general adult medical examination without abnormal findings: Secondary | ICD-10-CM

## 2023-12-31 DIAGNOSIS — R232 Flushing: Secondary | ICD-10-CM | POA: Diagnosis not present

## 2023-12-31 DIAGNOSIS — I10 Essential (primary) hypertension: Secondary | ICD-10-CM

## 2023-12-31 DIAGNOSIS — G43909 Migraine, unspecified, not intractable, without status migrainosus: Secondary | ICD-10-CM | POA: Insufficient documentation

## 2023-12-31 DIAGNOSIS — I5189 Other ill-defined heart diseases: Secondary | ICD-10-CM | POA: Diagnosis not present

## 2023-12-31 DIAGNOSIS — G43E09 Chronic migraine with aura, not intractable, without status migrainosus: Secondary | ICD-10-CM | POA: Diagnosis not present

## 2023-12-31 MED ORDER — VENLAFAXINE HCL ER 150 MG PO CP24
150.0000 mg | ORAL_CAPSULE | Freq: Every day | ORAL | 1 refills | Status: DC
Start: 2023-12-31 — End: 2024-06-29

## 2023-12-31 NOTE — Assessment & Plan Note (Signed)
 S/p closure. Was felt to have been cause for her CVA.

## 2023-12-31 NOTE — Assessment & Plan Note (Signed)
 Stable on nexium  once daily

## 2023-12-31 NOTE — Assessment & Plan Note (Signed)
 Grade 1, incidental finding on echo. Asymptomatic. Monitor.

## 2023-12-31 NOTE — Assessment & Plan Note (Signed)
 12/12/23, A1C 6.2.  Reinforced diet today.

## 2023-12-31 NOTE — Assessment & Plan Note (Signed)
 Diagnosed following CVA. Continues CPAP.

## 2023-12-31 NOTE — Telephone Encounter (Signed)
 Can you please abstract mammo from care everywhere?

## 2023-12-31 NOTE — Assessment & Plan Note (Signed)
 She is maintained on effexor  xr.  Tried coming off and  did not feel well.  Wishes to continue.

## 2023-12-31 NOTE — Assessment & Plan Note (Signed)
 Last LDL 69 at previous PCP. Continue statin. At goal of <70.

## 2023-12-31 NOTE — Patient Instructions (Signed)
 VISIT SUMMARY:  Today, we reviewed your health status and ongoing treatments. We discussed your stroke recovery, vertigo, tremor, hypertension, cholesterol, reflux, cataracts, and depression management.  YOUR PLAN:  STROKE WITH RESIDUAL MEMORY ISSUES: You had a stroke in May 2022, which has left you with memory issues and fatigue. -Continue taking low-dose aspirin  for stroke prevention. -Follow up with neurology every six months.  VERTIGO POST-STROKE: You have vertigo that developed after your stroke. Physical therapy was not helpful, but your symptoms have not been severe recently. -Monitor your vertigo symptoms and report any changes.  TREMOR, UNSPECIFIED: You have a tremor in your left hand that worsens with certain activities. -Continue using relaxation techniques to control the tremor. -Neurology will monitor for potential Parkinson's disease.  HYPERTENSION: Your blood pressure is well-controlled with losartan. -Continue taking losartan as prescribed.  HYPERLIPIDEMIA: Your cholesterol levels are managed with atorvastatin . -Continue taking atorvastatin  as prescribed.  REFLUX DISEASE: Your reflux is well-controlled with esomeprazole . -Continue taking esomeprazole  as prescribed.  CATARACTS: You have cataracts but no glaucoma. -Continue regular follow-ups with ophthalmology every six months for eye pressure monitoring.  DEPRESSION: You are taking Effexor  for hot flashes and menopausal symptoms. -Continue taking Effexor  as prescribed. -Your Effexor  prescription will be sent to the mail order pharmacy.

## 2023-12-31 NOTE — Assessment & Plan Note (Signed)
 Colon cancer screening is up to date.  Mammo up to date in care everywhere.  Consider repeating bone density next visit. Continue to work on [employer] and exercise.

## 2023-12-31 NOTE — Assessment & Plan Note (Signed)
 BP controlled on losartan 100 mg once daily.

## 2023-12-31 NOTE — Addendum Note (Signed)
 Addended by: Dorrene Gaucher on: 12/31/2023 01:05 PM   Modules accepted: Level of Service

## 2023-12-31 NOTE — Assessment & Plan Note (Signed)
 Currently stable- management per neurology.

## 2023-12-31 NOTE — Progress Notes (Addendum)
 Subjective:     Patient ID: Yolanda Gibbs, female    DOB: Oct 06, 1950, 73 y.o.   MRN: [id number]  Chief Complaint  Patient presents with   New Patient (Initial Visit)    HPI  Discussed the use of AI scribe software for clinical note transcription with the patient, who gave verbal consent to proceed.  History of Present Illness  Yolanda Gibbs is a 73 year old female who presents to reestablish care. She was last seen in 2019.  She experienced a stroke in 2022, leading to memory issues and fatigue. She takes low-dose aspirin  for stroke prevention and atorvastatin  for cholesterol management. She had a loop monitor placed following CVA.  Her CVA was thought to be due to a PFO/clot.  She has since had closure of the PFO.   She experiences ocular migraines and has a history of vertigo since the stroke. Physical therapy was not beneficial, but vertigo has not been severe recently, and she has not experienced falls.  She has a tremor in her left hand, controllable by relaxing, and is monitoring this symptom. The tremor worsens with activities like crocheting or when in a hurry. Her tremor is being monitored by Neurology.   She uses a CPAP machine but reports poor sleep quality, sleeping on two pillows, and experiencing shoulder strain.  She takes Effexor  for hot flashes, with a recent dosage increase due to symptom recurrence. Her reflux is well controlled with Nexium . She takes a multivitamin and a fruit and vegetable supplement, which initially helped with constipation but the effect has diminished.     Health Maintenance Due  Topic Date Due   Medicare Annual Wellness (AWV)  Never done   Hepatitis C Screening  Never done   Pneumonia Vaccine 41+ Years old (1 of 1 - PCV) Never done   Zoster Vaccines- Shingrix (1 of 2) Never done   MAMMOGRAM  01/18/2019   DTaP/Tdap/Td (3 - Td or Tdap) 07/23/2021   COVID-19 Vaccine (4 - 2024-25 season) 04/20/2023    Past Medical History:   Diagnosis Date   Allergy     seasonal   Aortic atherosclerosis (HCC)    mild, noted on CT 2022   CVA (cerebral vascular accident) (HCC)    Depression    Diastolic dysfunction    GERD (gastroesophageal reflux disease)    History of chicken pox    History of colon polyps    benign   History of surgical closure of patent foramen ovale (PFO)    Hypertension    Migraines    TIA (transient ischemic attack)     Past Surgical History:  Procedure Laterality Date   BUBBLE STUDY  01/02/2021   Procedure: BUBBLE STUDY;  Surgeon: Wendie Hamburg, MD;  Location: [facility] ENDOSCOPY;  Service: Cardiovascular;;   LOOP RECORDER INSERTION N/A 01/02/2021   Procedure: LOOP RECORDER INSERTION;  Surgeon: Luana Rumple, MD;  Location: [facility] INVASIVE CV LAB;  Service: Cardiovascular;  Laterality: N/A;   PATENT FORAMEN OVALE(PFO) CLOSURE N/A 03/09/2021   Procedure: PATENT FORAMEN OVALE (PFO) CLOSURE;  Surgeon: Arnoldo Lapping, MD;  Location: [facility] INVASIVE CV LAB;  Service: Cardiovascular;  Laterality: N/A;   TEE WITHOUT CARDIOVERSION N/A 01/02/2021   Procedure: TRANSESOPHAGEAL ECHOCARDIOGRAM (TEE);  Surgeon: Wendie Hamburg, MD;  Location: [facility] ENDOSCOPY;  Service: Cardiovascular;  Laterality: N/A;    Family History  Problem Relation Age of Onset   Arthritis Mother    CAD Mother    AAA (abdominal aortic aneurysm) Mother  Cancer Mother        lung   Stroke Father    Cancer Maternal Aunt        breast   Arthritis Maternal Grandmother    Cancer Maternal Grandfather        prostate   Depression Paternal Grandmother     Social History   Socioeconomic History   Marital status: Married    Spouse name: Not on file   Number of children: Not on file   Years of education: Not on file   Highest education level: Bachelor's degree (e.g., BA, AB, BS)  Occupational History   Not on file  Tobacco Use   Smoking status: Never   Smokeless tobacco: Never  Vaping Use   Vaping status: Never Used   Substance and Sexual Activity   Alcohol use: Yes    Alcohol/week: 3.0 - 4.0 standard drinks of alcohol    Types: 3 - 4 Standard drinks or equivalent per week    Comment: occasional   Drug use: No   Sexual activity: Not on file  Other Topics Concern   Not on file  Social History Narrative   Daughter at Yolanda Gibbs (Yolanda Gibbs) born 16   Daughter Erin 45   Husband is a Curator, has a Forensic scientist, has a Landscape architect helps with day to day of the business   Second marriage- prior to remarrying worked in Passenger transport manager.   Completed college   Enjoys watching old movies, needlework, cleaning her house   No pets.    Caffiene occasional 12 oz Dr Yolanda Gibbs,  coffee on weekends.   Social Drivers of Corporate investment banker Strain: Low Risk  (12/26/2023)   Overall Financial Resource Strain (CARDIA)    Difficulty of Paying Living Expenses: Not hard at all  Food Insecurity: No Food Insecurity (12/26/2023)   Hunger Vital Sign    Worried About Running Out of Food in the Last Year: Never true    Ran Out of Food in the Last Year: Never true  Transportation Needs: No Transportation Needs (12/26/2023)   PRAPARE - Administrator, Civil Service (Medical): No    Lack of Transportation (Non-Medical): No  Physical Activity: Insufficiently Active (12/26/2023)   Exercise Vital Sign    Days of Exercise per Week: 1 day    Minutes of Exercise per Session: 10 min  Stress: No Stress Concern Present (12/26/2023)   [employer] of Occupational Health - Occupational Stress Questionnaire    Feeling of Stress : Not at all  Social Connections: Moderately Isolated (12/26/2023)   Social Connection and Isolation Panel [NHANES]    Frequency of Communication with Friends and Family: Three times a week    Frequency of Social Gatherings with Friends and Family: Once a week    Attends Religious Services: Never    Database administrator or Organizations: No    Attends Engineer, structural: Not  on file    Marital Status: Married  Catering manager Violence: Not on file    Outpatient Medications Prior to Visit  Medication Sig Dispense Refill   aspirin  EC 81 MG EC tablet Take 1 tablet (81 mg total) by mouth daily. Swallow whole. 30 tablet 1   atorvastatin  (LIPITOR ) 40 MG tablet Take 1 tablet (40 mg total) by mouth daily. 30 tablet 1   Carboxymethylcellulose Sod PF 0.5 % SOLN Place 1 drop into both eyes in the morning and at bedtime.     diazepam  (  VALIUM ) 2 MG tablet Take 1 tablet (2 mg total) by mouth as needed for anxiety (Prior to MRI). Take 30-60 min prior to MRI. Can repeat x1 if needed 2 tablet 0   Docusate Sodium 100 MG capsule Take 200 mg by mouth at bedtime.     esomeprazole  (NEXIUM ) 20 MG capsule Take 40 mg by mouth daily at 12 noon.     losartan (COZAAR) 100 MG tablet Take 100 mg by mouth daily.     Multiple Vitamins-Minerals (MULTIVITAMIN ADULTS 50+) TABS Take 1 tablet by mouth daily.     OVER THE COUNTER MEDICATION Take 2 capsules by mouth daily. Natrol Juice Festiv- fruit supplement     OVER THE COUNTER MEDICATION Take 2 capsules by mouth daily. Natrol vegetable supplement     zonisamide  (ZONEGRAN ) 50 MG capsule TAKE 2 CAPSULES BY MOUTH DAILY 60 capsule 6   venlafaxine  XR (EFFEXOR -XR) 150 MG 24 hr capsule Take 1 capsule (150 mg total) by mouth daily with breakfast. 30 capsule 5   Calcium -Phosphorus-Vitamin D  (CITRACAL +D3 PO) Take 1 tablet by mouth daily. Vit d 500 IU, calcium  630mg  each     No facility-administered medications prior to visit.    Allergies  Allergen Reactions   Darvon [Propoxyphene] Other (See Comments)    "Slept for 3 days"   Duloxetine     Added from [facility]- patient is unsure     ROS See HPI    Objective:     Physical Exam Constitutional:      General: She is not in acute distress.    Appearance: Normal appearance. She is well-developed.  HENT:     Head: Normocephalic and atraumatic.     Right Ear: External ear normal.      Left Ear: External ear normal.  Eyes:     General: No scleral icterus. Neck:     Thyroid: No thyromegaly.  Cardiovascular:     Rate and Rhythm: Normal rate and regular rhythm.     Heart sounds: Normal heart sounds. No murmur heard. Pulmonary:     Effort: Pulmonary effort is normal. No respiratory distress.     Breath sounds: Normal breath sounds. No wheezing.  Musculoskeletal:     Cervical back: Neck supple.  Skin:    General: Skin is warm and dry.  Neurological:     Mental Status: She is alert and oriented to person, place, and time.     Cranial Nerves: No cranial nerve deficit.     Comments: Mild chin tremor noted  Psychiatric:        Mood and Affect: Mood normal.        Behavior: Behavior normal.        Thought Content: Thought content normal.        Judgment: Judgment normal.      BP 118/69 (BP Location: Right Arm, Patient Position: Sitting, Cuff Size: Small)   Pulse 87   Temp 99.5 F (37.5 C) (Oral)   Resp 16   Ht 5\' 4"  (1.626 m)   Wt 171 lb 12.8 oz (77.9 kg)   LMP 08/19/2004   SpO2 99%   BMI 29.49 kg/m  Wt Readings from Last 3 Encounters:  12/31/23 171 lb 12.8 oz (77.9 kg)  11/27/23 175 lb (79.4 kg)  05/14/23 172 lb (78 kg)       Assessment & Plan:   Problem List Items Addressed This Visit       Unprioritized   Vertigo   She did not  respond to vestibular rehab. Most likely secondary to stroke. Monitor.      Preventative health care - Primary   Colon cancer screening is up to date.  Mammo up to date in care everywhere.  Consider repeating bone density next visit. Continue to work on [employer] and exercise.       PFO (patent foramen ovale)   S/p closure. Was felt to have been cause for her CVA.       OSA (obstructive sleep apnea)   Diagnosed following CVA. Continues CPAP.       Migraines   Currently stable- management per neurology.       Relevant Medications   venlafaxine  XR (EFFEXOR -XR) 150 MG 24 hr capsule   Hyperlipidemia   Last  LDL 69 at previous PCP. Continue statin. At goal of <70.      Hyperglycemia   12/12/23, A1C 6.2.  Reinforced diet today.      Hot flashes   She is maintained on effexor  xr.  Tried coming off and  did not feel well.  Wishes to continue.      Essential hypertension   BP controlled on losartan 100 mg once daily.       Diastolic dysfunction   Grade 1, incidental finding on echo. Asymptomatic. Monitor.       CVA (cerebral vascular accident) (HCC)   Continue aspirin  81mg  + statin for secondary prevention.        I have discontinued Mertis L. Banas's Calcium -Phosphorus-Vitamin D  (CITRACAL +D3 PO). I am also having her maintain her Docusate Sodium, Multivitamin Adults 50+, aspirin  EC, atorvastatin , Carboxymethylcellulose Sod PF, losartan, esomeprazole , diazepam , OVER THE COUNTER MEDICATION, OVER THE COUNTER MEDICATION, zonisamide , and venlafaxine  XR.  Meds ordered this encounter  Medications   venlafaxine  XR (EFFEXOR -XR) 150 MG 24 hr capsule    Sig: Take 1 capsule (150 mg total) by mouth daily with breakfast.    Dispense:  90 capsule    Refill:  1    Supervising Provider:   Randie Bustle A [[id number]]

## 2023-12-31 NOTE — Assessment & Plan Note (Signed)
 Continue aspirin  81mg  + statin for secondary prevention.

## 2023-12-31 NOTE — Assessment & Plan Note (Signed)
 She did not respond to vestibular rehab. Most likely secondary to stroke. Monitor.

## 2024-01-01 NOTE — Telephone Encounter (Signed)
Mammogram abstracted 

## 2024-01-15 DIAGNOSIS — G4733 Obstructive sleep apnea (adult) (pediatric): Secondary | ICD-10-CM | POA: Diagnosis not present

## 2024-01-19 ENCOUNTER — Ambulatory Visit: Payer: Self-pay | Admitting: Cardiovascular Disease

## 2024-01-19 ENCOUNTER — Ambulatory Visit (INDEPENDENT_AMBULATORY_CARE_PROVIDER_SITE_OTHER)

## 2024-01-19 DIAGNOSIS — I639 Cerebral infarction, unspecified: Secondary | ICD-10-CM | POA: Diagnosis not present

## 2024-01-19 LAB — CUP PACEART REMOTE DEVICE CHECK
Date Time Interrogation Session: 20250601231513
Implantable Pulse Generator Implant Date: 20220517

## 2024-01-29 NOTE — Progress Notes (Signed)
 Carelink Summary Report / Loop Recorder

## 2024-02-19 ENCOUNTER — Ambulatory Visit: Payer: Self-pay | Admitting: Cardiovascular Disease

## 2024-02-19 ENCOUNTER — Ambulatory Visit (INDEPENDENT_AMBULATORY_CARE_PROVIDER_SITE_OTHER)

## 2024-02-19 DIAGNOSIS — I639 Cerebral infarction, unspecified: Secondary | ICD-10-CM | POA: Diagnosis not present

## 2024-02-19 LAB — CUP PACEART REMOTE DEVICE CHECK
Date Time Interrogation Session: 20250702230926
Implantable Pulse Generator Implant Date: 20220517

## 2024-03-01 DIAGNOSIS — K080 Exfoliation of teeth due to systemic causes: Secondary | ICD-10-CM | POA: Diagnosis not present

## 2024-03-08 NOTE — Progress Notes (Signed)
 Carelink Summary Report / Loop Recorder

## 2024-03-22 ENCOUNTER — Ambulatory Visit (INDEPENDENT_AMBULATORY_CARE_PROVIDER_SITE_OTHER)

## 2024-03-22 DIAGNOSIS — I639 Cerebral infarction, unspecified: Secondary | ICD-10-CM

## 2024-03-22 LAB — CUP PACEART REMOTE DEVICE CHECK
Date Time Interrogation Session: 20250802230810
Implantable Pulse Generator Implant Date: 20220517

## 2024-03-23 ENCOUNTER — Ambulatory Visit: Payer: Self-pay | Admitting: Cardiovascular Disease

## 2024-03-23 DIAGNOSIS — K219 Gastro-esophageal reflux disease without esophagitis: Secondary | ICD-10-CM

## 2024-03-29 NOTE — Telephone Encounter (Signed)
 Copied from CRM [id number]. Topic: Clinical - Medication Refill >> Mar 29, 2024  2:19 PM Harlene ORN wrote: Medication: esomeprazole  (NEXIUM ) 20 MG capsule  Has the patient contacted their pharmacy? Yes (Agent: If no, request that the patient contact the pharmacy for the refill. If patient does not wish to contact the pharmacy document the reason why and proceed with request.) (Agent: If yes, when and what did the pharmacy advise?)  This is the patient's preferred pharmacy:   [facility] - [place], [place] - [address] [place] [address] Phone: [phone] Fax: [phone]  Is this the correct pharmacy for this prescription? Yes If no, delete pharmacy and type the correct one.   Has the prescription been filled recently? No  Is the patient out of the medication? Yes  Has the patient been seen for an appointment in the last year OR does the patient have an upcoming appointment? Yes  Can we respond through MyChart? Yes  Agent: Please be advised that Rx refills may take up to 3 business days. We ask that you follow-up with your pharmacy.

## 2024-03-30 MED ORDER — ESOMEPRAZOLE MAGNESIUM 20 MG PO CPDR
40.0000 mg | DELAYED_RELEASE_CAPSULE | Freq: Every day | ORAL | 0 refills | Status: DC
Start: 2024-03-30 — End: 2024-04-02

## 2024-04-01 ENCOUNTER — Other Ambulatory Visit: Payer: Self-pay | Admitting: Cardiovascular Disease

## 2024-04-01 DIAGNOSIS — K219 Gastro-esophageal reflux disease without esophagitis: Secondary | ICD-10-CM

## 2024-04-02 ENCOUNTER — Other Ambulatory Visit (HOSPITAL_COMMUNITY): Payer: Self-pay

## 2024-04-02 ENCOUNTER — Telehealth: Payer: Self-pay | Admitting: Cardiovascular Disease

## 2024-04-02 DIAGNOSIS — K219 Gastro-esophageal reflux disease without esophagitis: Secondary | ICD-10-CM

## 2024-04-02 MED ORDER — ESOMEPRAZOLE MAGNESIUM 40 MG PO CPDR
40.0000 mg | DELAYED_RELEASE_CAPSULE | Freq: Every day | ORAL | 1 refills | Status: DC
  Filled 2024-04-02: qty 90, 90d supply, fill #0

## 2024-04-02 MED ORDER — ESOMEPRAZOLE MAGNESIUM 40 MG PO CPDR: 40.0000 mg | DELAYED_RELEASE_CAPSULE | Freq: Every day | ORAL | 1 refills | Status: DC

## 2024-04-02 NOTE — Telephone Encounter (Signed)
 Pt c/o medication issue:  1. Name of Medication:   esomeprazole  (NEXIUM ) 20 MG capsule   2. How are you currently taking this medication (dosage and times per day)?   3. Are you having a reaction (difficulty breathing--STAT)?   4. What is your medication issue?   Caller ([name]) stated patient's insurance plan limits prescription to one daily dose.  Caller wants a call back to discuss medication dosage change.

## 2024-04-02 NOTE — Telephone Encounter (Signed)
 Please advise pt that I changed her nexium  from two 20mg  caps to one 40mg  cap.

## 2024-04-02 NOTE — Telephone Encounter (Signed)
 Attempted to contact [employer] service at # listed.  After calling them, received message that they system is not working correctly and to call back later.  The call was then disconnected.  In review of this medication order - was placed by Eleanor Ponto, NP.  Will forward this call to her to discuss medication and insurance plan coverage as requested.

## 2024-04-05 NOTE — Telephone Encounter (Signed)
Patient notified of changes.

## 2024-04-05 NOTE — Telephone Encounter (Signed)
 Called patient but no answer, left voice mail for patient to call back.

## 2024-04-12 ENCOUNTER — Other Ambulatory Visit (HOSPITAL_BASED_OUTPATIENT_CLINIC_OR_DEPARTMENT_OTHER): Payer: Self-pay

## 2024-04-12 ENCOUNTER — Other Ambulatory Visit (HOSPITAL_COMMUNITY): Payer: Self-pay

## 2024-04-12 ENCOUNTER — Other Ambulatory Visit: Payer: Self-pay | Admitting: Family

## 2024-04-12 ENCOUNTER — Other Ambulatory Visit: Payer: Self-pay

## 2024-04-12 DIAGNOSIS — K219 Gastro-esophageal reflux disease without esophagitis: Secondary | ICD-10-CM

## 2024-04-12 MED ORDER — ESOMEPRAZOLE MAGNESIUM 40 MG PO CPDR
40.0000 mg | DELAYED_RELEASE_CAPSULE | Freq: Every day | ORAL | 1 refills | Status: DC
  Filled 2024-04-12: qty 90, 90d supply, fill #0
  Filled 2024-04-16: qty 90, 90d supply, fill #1

## 2024-04-16 ENCOUNTER — Telehealth: Payer: Self-pay | Admitting: Neurology

## 2024-04-16 ENCOUNTER — Other Ambulatory Visit: Payer: Self-pay

## 2024-04-16 ENCOUNTER — Other Ambulatory Visit (HOSPITAL_COMMUNITY): Payer: Self-pay

## 2024-04-16 DIAGNOSIS — K219 Gastro-esophageal reflux disease without esophagitis: Secondary | ICD-10-CM

## 2024-04-16 NOTE — Telephone Encounter (Signed)
 Copied from CRM [id number]. Topic: Clinical - Medication Question >> Apr 16, 2024 12:21 PM Lauren C wrote: Reason for CRM: Regarding esomeprazole  (NEXIUM ) 40 MG capsule. This was to be taken twice a day, but was changed to once a day. She says she been having symptoms with cutting back, such as nausea and coughing and heartburn. She would like to go back to twice a day. She is wondering if she can purchase this over the counter so she doesn't run out taking 2 a day. Requesting a cb [phone].

## 2024-04-20 MED ORDER — ESOMEPRAZOLE MAGNESIUM 40 MG PO CPDR: 40.0000 mg | DELAYED_RELEASE_CAPSULE | Freq: Two times a day (BID) | ORAL | 0 refills | Status: DC

## 2024-04-20 NOTE — Addendum Note (Signed)
 Addended by: DARYL SETTER on: 04/20/2024 04:09 PM   Modules accepted: Orders

## 2024-04-22 ENCOUNTER — Ambulatory Visit (INDEPENDENT_AMBULATORY_CARE_PROVIDER_SITE_OTHER)

## 2024-04-22 DIAGNOSIS — L821 Other seborrheic keratosis: Secondary | ICD-10-CM | POA: Diagnosis not present

## 2024-04-22 DIAGNOSIS — I639 Cerebral infarction, unspecified: Secondary | ICD-10-CM

## 2024-04-22 DIAGNOSIS — Z85828 Personal history of other malignant neoplasm of skin: Secondary | ICD-10-CM | POA: Diagnosis not present

## 2024-04-22 DIAGNOSIS — L570 Actinic keratosis: Secondary | ICD-10-CM | POA: Diagnosis not present

## 2024-04-22 DIAGNOSIS — L578 Other skin changes due to chronic exposure to nonionizing radiation: Secondary | ICD-10-CM | POA: Diagnosis not present

## 2024-04-22 DIAGNOSIS — Z08 Encounter for follow-up examination after completed treatment for malignant neoplasm: Secondary | ICD-10-CM | POA: Diagnosis not present

## 2024-04-22 LAB — CUP PACEART REMOTE DEVICE CHECK
Date Time Interrogation Session: 20250903230925
Implantable Pulse Generator Implant Date: 20220517

## 2024-04-23 ENCOUNTER — Ambulatory Visit: Payer: Self-pay | Admitting: Cardiovascular Disease

## 2024-05-01 NOTE — Progress Notes (Signed)
 Remote Loop Recorder Transmission

## 2024-05-17 NOTE — Progress Notes (Signed)
 Remote Loop Recorder Transmission

## 2024-05-19 ENCOUNTER — Ambulatory Visit

## 2024-05-19 ENCOUNTER — Telehealth: Payer: Self-pay | Admitting: *Deleted

## 2024-05-19 VITALS — Ht 64.0 in | Wt 167.0 lb

## 2024-05-19 DIAGNOSIS — Z0000 Encounter for general adult medical examination without abnormal findings: Secondary | ICD-10-CM | POA: Diagnosis not present

## 2024-05-19 NOTE — Progress Notes (Signed)
 Subjective:   Yolanda Gibbs is a 73 y.o. who presents for a Medicare Wellness preventive visit.  As a reminder, Annual Wellness Visits don't include a physical exam, and some assessments may be limited, especially if this visit is performed virtually. We may recommend an in-person follow-up visit with your provider if needed.  Visit Complete: Virtual I connected with  [name] on 05/19/24 by a audio enabled telemedicine application and verified that I am speaking with the correct person using two identifiers.  Patient Location: Home  Provider Location: Office/Clinic  I discussed the limitations of evaluation and management by telemedicine. The patient expressed understanding and agreed to proceed.  Vital Signs: Because this visit was a virtual/telehealth visit, some criteria may be missing or patient reported. Any vitals not documented were not able to be obtained and vitals that have been documented are patient reported.  VideoDeclined- This patient declined Librarian, academic. Therefore the visit was completed with audio only.  Persons Participating in Visit: Patient.  AWV Questionnaire: Yes: Patient Medicare AWV questionnaire was completed by the patient on 05/18/24; I have confirmed that all information answered by patient is correct and no changes since this date.  Cardiac Risk Factors include: advanced age (>29men, >16 women);hypertension;dyslipidemia;Other (see comment), Risk factor comments: OSA, CVA, diastolic dysfunction     Objective:    Today's Vitals   05/19/24 1103  Weight: 167 lb (75.8 kg)  Height: 5' 4 (1.626 m)   Body mass index is 28.67 kg/m.     05/19/2024   11:12 AM 03/09/2021   10:01 AM 01/03/2021    8:08 AM 12/31/2020   12:45 PM  Advanced Directives  Does Patient Have a Medical Advance Directive? No No No No  Would patient like information on creating a medical advance directive? Yes (MAU/Ambulatory/Procedural  Areas - Information given) No - Patient declined No - Patient declined Yes (ED - Information included in AVS)    Current Medications (verified) Outpatient Encounter Medications as of 05/19/2024  Medication Sig   aspirin  EC 81 MG EC tablet Take 1 tablet (81 mg total) by mouth daily. Swallow whole.   atorvastatin  (LIPITOR ) 40 MG tablet Take 1 tablet (40 mg total) by mouth daily.   Carboxymethylcellulose Sod PF 0.5 % SOLN Place 1 drop into both eyes in the morning and at bedtime.   diazepam  (VALIUM ) 2 MG tablet Take 1 tablet (2 mg total) by mouth as needed for anxiety (Prior to MRI). Take 30-60 min prior to MRI. Can repeat x1 if needed   Docusate Sodium 100 MG capsule Take 200 mg by mouth at bedtime.   esomeprazole  (NEXIUM ) 40 MG capsule Take 1 capsule (40 mg total) by mouth 2 (two) times daily before a meal.   losartan (COZAAR) 100 MG tablet Take 100 mg by mouth daily.   Multiple Vitamins-Minerals (MULTIVITAMIN ADULTS 50+) TABS Take 1 tablet by mouth daily.   OVER THE COUNTER MEDICATION Take 2 capsules by mouth daily. Natrol Juice Festiv- fruit supplement   OVER THE COUNTER MEDICATION Take 2 capsules by mouth daily. Natrol vegetable supplement   venlafaxine  XR (EFFEXOR -XR) 150 MG 24 hr capsule Take 1 capsule (150 mg total) by mouth daily with breakfast.   zonisamide  (ZONEGRAN ) 50 MG capsule TAKE 2 CAPSULES BY MOUTH DAILY   No facility-administered encounter medications on file as of 05/19/2024.    Allergies (verified) Darvon [propoxyphene] and Duloxetine   History: Past Medical History:  Diagnosis Date   Allergy   seasonal   Aortic atherosclerosis    mild, noted on CT 2022   CVA (cerebral vascular accident) (HCC)    Depression    Diastolic dysfunction    GERD (gastroesophageal reflux disease)    History of chicken pox    History of colon polyps    benign   History of surgical closure of patent foramen ovale (PFO)    Hypertension    Migraines    TIA (transient ischemic attack)     Past Surgical History:  Procedure Laterality Date   BUBBLE STUDY  01/02/2021   Procedure: BUBBLE STUDY;  Surgeon: Kate Lonni CROME, MD;  Location: [facility] ENDOSCOPY;  Service: Cardiovascular;;   LOOP RECORDER INSERTION N/A 01/02/2021   Procedure: LOOP RECORDER INSERTION;  Surgeon: Francyne Headland, MD;  Location: [facility] INVASIVE CV LAB;  Service: Cardiovascular;  Laterality: N/A;   PATENT FORAMEN OVALE(PFO) CLOSURE N/A 03/09/2021   Procedure: PATENT FORAMEN OVALE (PFO) CLOSURE;  Surgeon: Wonda Sharper, MD;  Location[facility] INVASIVE CV LAB;  Service: Cardiovascular;  Laterality: N/A;   TEE WITHOUT CARDIOVERSION N/A 01/02/2021   Procedure: TRANSESOPHAGEAL ECHOCARDIOGRAM (TEE);  Surgeon: Kate Lonni CROME, MD;  Location: [facility] ENDOSCOPY;  Service: Cardiovascular;  Laterality: N/A;   Family History  Problem Relation Age of Onset   Arthritis Mother    CAD Mother    AAA (abdominal aortic aneurysm) Mother    Cancer Mother        lung   Stroke Father    Cancer Maternal Aunt        breast   Arthritis Maternal Grandmother    Cancer Maternal Grandfather        prostate   Depression Paternal Grandmother    Social History   Socioeconomic History   Marital status: Married    Spouse name: Not on file   Number of children: Not on file   Years of education: Not on file   Highest education level: Bachelor's degree (e.g., BA, AB, BS)  Occupational History   Not on file  Tobacco Use   Smoking status: Never   Smokeless tobacco: Never  Vaping Use   Vaping status: Never Used  Substance and Sexual Activity   Alcohol use: Not Currently    Alcohol/week: 3.0 - 4.0 standard drinks of alcohol    Types: 3 - 4 Glasses of wine per week    Comment: occasional   Drug use: No   Sexual activity: Not on file  Other Topics Concern   Not on file  Social History Narrative   Daughter at Yolanda (Gibbs) born 71   Daughter Yolanda Gibbs 22   Husband is a Curator, has a Forensic scientist, has a Landscape architect helps  with day to day of the business   Second marriage- prior to remarrying worked in Passenger transport manager.   Completed college   Enjoys watching old movies, needlework, cleaning her house   No pets.    Caffiene occasional 12 oz Dr Nunzio,  coffee on weekends.   Social Drivers of Corporate investment banker Strain: Low Risk  (05/18/2024)   Overall Financial Resource Strain (CARDIA)    Difficulty of Paying Living Expenses: Not hard at all  Food Insecurity: No Food Insecurity (05/18/2024)   Hunger Vital Sign    Worried About Running Out of Food in the Last Year: Never true    Ran Out of Food in the Last Year: Never true  Transportation Needs: No Transportation Needs (05/18/2024)   PRAPARE - Transportation  Lack of Transportation (Medical): No    Lack of Transportation (Non-Medical): No  Physical Activity: Insufficiently Active (05/18/2024)   Exercise Vital Sign    Days of Exercise per Week: 2 days    Minutes of Exercise per Session: 10 min  Stress: No Stress Concern Present (05/18/2024)   [employer] of Occupational Health - Occupational Stress Questionnaire    Feeling of Stress: Not at all  Social Connections: Moderately Isolated (05/18/2024)   Social Connection and Isolation Panel    Frequency of Communication with Friends and Family: More than three times a week    Frequency of Social Gatherings with Friends and Family: Once a week    Attends Religious Services: Never    Database administrator or Organizations: No    Attends Engineer, structural: Not on file    Marital Status: Married    Tobacco Counseling Counseling given: Not Answered    Clinical Intake:  Pre-visit preparation completed: Yes  Pain : No/denies pain     BMI - recorded: 28.67 Nutritional Status: BMI 25 -29 Overweight Nutritional Risks: None Diabetes: No  Lab Results  Component Value Date   HGBA1C 6.2 (H) 01/01/2021     How often do you need to have someone help you when you read  instructions, pamphlets, or other written materials from your doctor or pharmacy?: 1 - Never What is the last grade level you completed in school?: bachelor's degree  Interpreter Needed?: No  Information entered by :: Lolita Libra, CMA(AAMA)   Activities of Daily Living     05/18/2024    2:23 PM  In your present state of health, do you have any difficulty performing the following activities:  Hearing? 0  Vision? 0  Difficulty concentrating or making decisions? 0  Walking or climbing stairs? 0  Dressing or bathing? 0  Doing errands, shopping? 0  Preparing Food and eating ? N  Using the Toilet? N  In the past six months, have you accidently leaked urine? N  Do you have problems with loss of bowel control? N  Managing your Medications? N  Managing your Finances? N  Housekeeping or managing your Housekeeping? N    Patient Care Team: Daryl Setter, NP as PCP - General (Internal Medicine) Teresa Manuelita RAMAN., MD (Obstetrics and Gynecology) Maurilio Camellia PARAS, MD as Consulting Physician (Allergy  and Immunology) Carlie Clark, MD as Consulting Physician (Otolaryngology) Kristie Lamprey, MD as Consulting Physician (Gastroenterology) Marcey Elspeth PARAS, MD as Consulting Physician (Ophthalmology)  I have updated your Care Teams any recent Medical Services you may have received from other providers in the past year.     Assessment:   This is a routine wellness examination for Kayia.  Hearing/Vision screen Hearing Screening - Comments:: Has tinnitus, no hearing aids needed  yet Vision Screening - Comments:: Up to date with routine eye exams with [facility]   Goals Addressed               This Visit's Progress     Patient Stated (pt-stated)        I want to increase my physical exercise by walking around the neighborhood 3-4 days a week for 15-47min each       Depression Screen     05/19/2024   11:10 AM 12/31/2023   10:53 AM 02/14/2021   11:43 AM 09/22/2017    5:29 PM  09/16/2016    7:39 PM 08/29/2014    2:50 PM  PHQ 2/9 Scores  PHQ -  2 Score 0 0 0 0 0 1  PHQ- 9 Score 2 0  0 1     Fall Risk     05/18/2024    2:23 PM 12/31/2023   10:54 AM  Fall Risk   Falls in the past year? 0 0  Number falls in past yr: 0 0  Injury with Fall? 0 0  Risk for fall due to :  No Fall Risks  Follow up Education provided Falls evaluation completed    MEDICARE RISK AT HOME:  Medicare Risk at Home Any stairs in or around the home?: (Patient-Rptd) Yes If so, are there any without handrails?: (Patient-Rptd) Yes Adequate lighting in your home to reduce risk of falls?: (Patient-Rptd) Yes Life alert?: (Patient-Rptd) No Use of a cane, walker or w/c?: (Patient-Rptd) No Grab bars in the bathroom?: (Patient-Rptd) No Shower chair or bench in shower?: (Patient-Rptd) No Elevated toilet seat or a handicapped toilet?: (Patient-Rptd) No  TIMED UP AND GO:  Was the test performed?  No,audio  Cognitive Function: 6CIT completed    11/27/2023    3:14 PM  MMSE - Mini Mental State Exam  Orientation to time 5  Orientation to Place 5  Registration 3  Attention/ Calculation 4  Recall 3  Language- name 2 objects 2  Language- repeat 1  Language- follow 3 step command 3  Language- read & follow direction 1  Write a sentence 1  Copy design 1  Total score 29        Immunizations Immunization History  Administered Date(s) Administered   INFLUENZA, HIGH DOSE SEASONAL PF 05/11/2018, 05/16/2022, 06/18/2023   Influenza,inj,Quad PF,6+ Mos 11/01/2015, 05/08/2016, 06/23/2017   Influenza-Unspecified 05/19/2014   PFIZER(Purple Top)SARS-COV-2 Vaccination 11/13/2019, 12/04/2019, 07/07/2020   Pfizer Covid-19 Vaccine Bivalent Booster 61yrs & up 07/07/2021   Td 08/19/2006   Tdap 07/24/2011    Screening Tests Health Maintenance  Topic Date Due   Hepatitis C Screening  Never done   Pneumococcal Vaccine: 50+ Years (1 of 1 - PCV) Never done   Zoster Vaccines- Shingrix (1 of 2) Never done    DTaP/Tdap/Td (3 - Td or Tdap) 07/23/2021   Influenza Vaccine  03/19/2024   COVID-19 Vaccine (5 - 2025-26 season) 05/19/2025 (Originally 04/19/2024)   Medicare Annual Wellness (AWV)  05/19/2025   Mammogram  12/15/2025   Colonoscopy  06/15/2028   DEXA SCAN  Completed   HPV VACCINES  Aged Out   Meningococcal B Vaccine  Aged Out    Health Maintenance Items Addressed: Will consider: shingles, tetanus, flu and pneumonia vaccines at the pharmacy.   Additional Screening:  Vision Screening: Recommended annual ophthalmology exams for early detection of glaucoma and other disorders of the eye. Is the patient up to date with their annual eye exam?  Yes  Who is the provider or what is the name of the office in which the patient attends annual eye exams? [facility]  Dental Screening: Recommended annual dental exams for proper oral hygiene  Community Resource Referral / Chronic Care Management: CRR required this visit?  No   CCM required this visit?  No   Plan:    I have personally reviewed and noted the following in the patient's chart:   Medical and social history Use of alcohol, tobacco or illicit drugs  Current medications and supplements including opioid prescriptions. Patient is not currently taking opioid prescriptions. Functional ability and status Nutritional status Physical activity Advanced directives List of other physicians Hospitalizations, surgeries, and ER visits in previous 12  months Vitals Screenings to include cognitive, depression, and falls Referrals and appointments  In addition, I have reviewed and discussed with patient certain preventive protocols, quality metrics, and best practice recommendations. A written personalized care plan for preventive services as well as general preventive health recommendations were provided to patient.   Lolita Libra, CMA   05/19/2024   After Visit Summary: (MyChart) Due to this being a telephonic visit, the after visit  summary with patients personalized plan was offered to patient via MyChart   Notes: Nothing significant to report at this time.

## 2024-05-19 NOTE — Patient Instructions (Addendum)
 Ms. Mackowiak , Thank you for taking time out of your busy schedule to complete your Annual Wellness Visit with me. I enjoyed our conversation and look forward to speaking with you again next year. I, as well as your care team,  appreciate your ongoing commitment to your health goals. Please review the following plan we discussed and let me know if I can assist you in the future. Your Game plan/ To Do List     Congratulations on making your lifestyle changes! Goals:  I want to increase my physical exercise by walking around the neighborhood 3-4 days a week for 15-21min each  Follow up Visits: Next Medicare AWV with our clinical staff: 05/24/25 10:20am, telephone    Next Office Visit with your provider: 07/07/24 1:20pm, Eleanor Ponto, NP  Clinician Recommendations:  Aim for 30 minutes of exercise or brisk walking, 6-8 glasses of water, and 5 servings of fruits and vegetables each day.   You will need to get the following vaccines at your local pharmacy: Flu, tetanus, pneumonia and shingles      This is a list of the screening recommended for you and due dates:  Health Maintenance  Topic Date Due   Medicare Annual Wellness Visit  Never done   Hepatitis C Screening  Never done   Pneumococcal Vaccine for age over 60 (1 of 1 - PCV) Never done   Zoster (Shingles) Vaccine (1 of 2) Never done   DTaP/Tdap/Td vaccine (3 - Td or Tdap) 07/23/2021   Flu Shot  03/19/2024   COVID-19 Vaccine (5 - 2025-26 season) 05/19/2025*   Breast Cancer Screening  12/15/2025   Colon Cancer Screening  06/15/2028   DEXA scan (bone density measurement)  Completed   HPV Vaccine  Aged Out   Meningitis B Vaccine  Aged Out  *Topic was postponed. The date shown is not the original due date.    Advanced directives: (Provided) Advance directive discussed with you today. I have provided a copy for you to complete at home and have notarized. Once this is complete, please bring a copy in to our office so we can scan it  into your chart.  Advance Care Planning is important because it:  [x]  Makes sure you receive the medical care that is consistent with your values, goals, and preferences  [x]  It provides guidance to your family and loved ones and reduces their decisional burden about whether or not they are making the right decisions based on your wishes.  Follow the link provided in your after visit summary or read over the paperwork we have mailed to you to help you started getting your Advance Directives in place. If you need assistance in completing these, please reach out to us  so that we can help you!  See attachments for Preventive Care and Fall Prevention Tips.

## 2024-05-19 NOTE — Telephone Encounter (Signed)
 FYI:  Pt had awv today and mentions the neurologist has noted a tremor in her left hand and is wanting to test her for Parkinsons. Pt is waiting until after Christmas to proceed with testing.

## 2024-05-19 NOTE — Telephone Encounter (Signed)
 Noted

## 2024-05-24 ENCOUNTER — Encounter

## 2024-05-24 DIAGNOSIS — H5203 Hypermetropia, bilateral: Secondary | ICD-10-CM | POA: Diagnosis not present

## 2024-05-25 ENCOUNTER — Ambulatory Visit

## 2024-05-25 DIAGNOSIS — I639 Cerebral infarction, unspecified: Secondary | ICD-10-CM | POA: Diagnosis not present

## 2024-05-26 LAB — CUP PACEART REMOTE DEVICE CHECK
Date Time Interrogation Session: 20251006230837
Implantable Pulse Generator Implant Date: 20220517

## 2024-05-27 ENCOUNTER — Ambulatory Visit: Payer: Self-pay | Admitting: Cardiovascular Disease

## 2024-05-27 NOTE — Progress Notes (Signed)
 Remote Loop Recorder Transmission

## 2024-05-28 NOTE — Progress Notes (Signed)
 Remote Loop Recorder Transmission

## 2024-06-22 DIAGNOSIS — Z23 Encounter for immunization: Secondary | ICD-10-CM | POA: Diagnosis not present

## 2024-06-24 ENCOUNTER — Encounter

## 2024-06-25 ENCOUNTER — Encounter

## 2024-06-29 ENCOUNTER — Other Ambulatory Visit: Payer: Self-pay

## 2024-06-29 DIAGNOSIS — G43E09 Chronic migraine with aura, not intractable, without status migrainosus: Secondary | ICD-10-CM

## 2024-06-29 MED ORDER — VENLAFAXINE HCL ER 150 MG PO CP24: 150.0000 mg | ORAL_CAPSULE | Freq: Every day | ORAL | 0 refills | Status: DC

## 2024-07-02 ENCOUNTER — Ambulatory Visit: Admitting: Family

## 2024-07-04 ENCOUNTER — Other Ambulatory Visit: Payer: Self-pay | Admitting: Adult Health

## 2024-07-07 ENCOUNTER — Ambulatory Visit (INDEPENDENT_AMBULATORY_CARE_PROVIDER_SITE_OTHER): Admitting: Family

## 2024-07-07 VITALS — BP 135/73 | HR 92 | Temp 98.8°F | Resp 16 | Ht 64.0 in | Wt 171.1 lb

## 2024-07-07 DIAGNOSIS — Z95811 Presence of heart assist device: Secondary | ICD-10-CM | POA: Insufficient documentation

## 2024-07-07 DIAGNOSIS — R739 Hyperglycemia, unspecified: Secondary | ICD-10-CM | POA: Diagnosis not present

## 2024-07-07 DIAGNOSIS — R5383 Other fatigue: Secondary | ICD-10-CM | POA: Insufficient documentation

## 2024-07-07 DIAGNOSIS — K219 Gastro-esophageal reflux disease without esophagitis: Secondary | ICD-10-CM

## 2024-07-07 DIAGNOSIS — F32A Depression, unspecified: Secondary | ICD-10-CM | POA: Diagnosis not present

## 2024-07-07 DIAGNOSIS — I639 Cerebral infarction, unspecified: Secondary | ICD-10-CM

## 2024-07-07 DIAGNOSIS — I10 Essential (primary) hypertension: Secondary | ICD-10-CM

## 2024-07-07 DIAGNOSIS — J309 Allergic rhinitis, unspecified: Secondary | ICD-10-CM

## 2024-07-07 DIAGNOSIS — R059 Cough, unspecified: Secondary | ICD-10-CM

## 2024-07-07 NOTE — Assessment & Plan Note (Signed)
 Lab Results  Component Value Date   HGBA1C 6.2 (H) 01/01/2021   Will update A1C today.

## 2024-07-07 NOTE — Progress Notes (Signed)
 Subjective:     Patient ID: Yolanda Gibbs, female    DOB: [birth year], 73 y.o.   MRN: [id number]  Chief Complaint  Patient presents with   Hypertension    Here for follow up    Hyperlipidemia    Here for follow up    Gastroesophageal Reflux    Would like to discuss medication     HPI  Discussed the use of AI scribe software for clinical note transcription with the patient, who gave verbal consent to proceed.  History of Present Illness Yolanda Gibbs is a 73 year old female who presents with worsening fatigue over the past two and a half months.  She has experienced significant fatigue that has progressively worsened over the past two to two and a half months. There have been no changes in her schedule, sleep, or stress levels. She uses a CPAP machine regularly and follows up with a sleep specialist. Previous lab work in April showed normal results, including kidney function, blood count, thyroid , and B12 levels. She takes a multivitamin that includes B12.  She takes losartan and her blood pressure was measured at 135/73 mmHg. She also takes atorvastatin  and aspirin .  She experiences gastroesophageal reflux disease (GERD) and takes 80 mg of medication twice daily. She had an episode of severe heartburn when she skipped a dose, requiring ginger ale and Pepto Bismol for relief. Her GERD medication was initially started to address a persistent cough, which has not resolved.  She has a history of allergies, which are slightly worse this time of year, and she takes sinus medication for pressure, pain, and congestion. She does not currently take any specific allergy  medication.  Her daughters have commented on perceived swelling in her arms and face, but she attributes this to weight rather than fluid retention. She experiences swelling in her feet and ankles after standing for extended periods, which resolves with elevation. She maintains a low sodium diet and avoids fried foods,  though she occasionally desires sweets.      Health Maintenance Due  Topic Date Due   Hepatitis C Screening  Never done   Zoster Vaccines- Shingrix (1 of 2) Never done   DTaP/Tdap/Td (3 - Td or Tdap) 07/23/2021    Past Medical History:  Diagnosis Date   Allergy     seasonal   Aortic atherosclerosis    mild, noted on CT 2022   CVA (cerebral vascular accident) (HCC)    Depression    Diastolic dysfunction    GERD (gastroesophageal reflux disease)    History of chicken pox    History of colon polyps    benign   History of surgical closure of patent foramen ovale (PFO)    Hypertension    Migraines    TIA (transient ischemic attack)     Past Surgical History:  Procedure Laterality Date   BUBBLE STUDY  01/02/2021   Procedure: BUBBLE STUDY;  Surgeon: Kate Lonni LITTIE, MD;  Location: [facility] ENDOSCOPY;  Service: Cardiovascular;;   LOOP RECORDER INSERTION N/A 01/02/2021   Procedure: LOOP RECORDER INSERTION;  Surgeon: Francyne Headland, MD;  Location: [facility] INVASIVE CV LAB;  Service: Cardiovascular;  Laterality: N/A;   PATENT FORAMEN OVALE(PFO) CLOSURE N/A 03/09/2021   Procedure: PATENT FORAMEN OVALE (PFO) CLOSURE;  Surgeon: Wonda Sharper, MD;  Location: [facility] INVASIVE CV LAB;  Service: Cardiovascular;  Laterality: N/A;   TEE WITHOUT CARDIOVERSION N/A 01/02/2021   Procedure: TRANSESOPHAGEAL ECHOCARDIOGRAM (TEE);  Surgeon: Kate Lonni LITTIE, MD;  Location: [facility] ENDOSCOPY;  Service: Cardiovascular;  Laterality: N/A;    Family History  Problem Relation Age of Onset   Arthritis Mother    CAD Mother    AAA (abdominal aortic aneurysm) Mother    Cancer Mother        lung   Stroke Father    Cancer Maternal Aunt        breast   Arthritis Maternal Grandmother    Cancer Maternal Grandfather        prostate   Depression Paternal Grandmother     Social History   Socioeconomic History   Marital status: Married    Spouse name: Not on file   Number of children: Not on file   Years  of education: Not on file   Highest education level: Bachelor's degree (e.g., BA, AB, BS)  Occupational History   Not on file  Tobacco Use   Smoking status: Never   Smokeless tobacco: Never  Vaping Use   Vaping status: Never Used  Substance and Sexual Activity   Alcohol use: Not Currently    Alcohol/week: 3.0 - 4.0 standard drinks of alcohol    Types: 3 - 4 Glasses of wine per week    Comment: occasional   Drug use: No   Sexual activity: Not on file  Other Topics Concern   Not on file  Social History Narrative   Daughter at [employer] ([employer]ren) born 68   Daughter Erin 44   Husband is a curator, has a forensic scientist, has a landscape architect helps with day to day of the business   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   Completed college   Enjoys watching old movies, needlework, cleaning her house   No pets.    Caffiene occasional 12 oz Dr Nunzio,  coffee on weekends.   Social Drivers of Corporate Investment Banker Strain: Low Risk  (07/01/2024)   Overall Financial Resource Strain (CARDIA)    Difficulty of Paying Living Expenses: Not hard at all  Food Insecurity: No Food Insecurity (07/01/2024)   Hunger Vital Sign    Worried About Running Out of Food in the Last Year: Never true    Ran Out of Food in the Last Year: Never true  Transportation Needs: No Transportation Needs (07/01/2024)   PRAPARE - Administrator, Civil Service (Medical): No    Lack of Transportation (Non-Medical): No  Physical Activity: Insufficiently Active (07/01/2024)   Exercise Vital Sign    Days of Exercise per Week: 2 days    Minutes of Exercise per Session: 10 min  Stress: No Stress Concern Present (07/01/2024)   [employer] of Occupational Health - Occupational Stress Questionnaire    Feeling of Stress: Not at all  Social Connections: Moderately Isolated (07/01/2024)   Social Connection and Isolation Panel    Frequency of Communication with Friends  and Family: More than three times a week    Frequency of Social Gatherings with Friends and Family: Not on file    Attends Religious Services: Never    Database Administrator or Organizations: No    Attends Banker Meetings: Not on file    Marital Status: Married  Catering Manager Violence: Not At Risk (05/19/2024)   Humiliation, Afraid, Rape, and Kick questionnaire    Fear of Current or Ex-Partner: No    Emotionally Abused: No    Physically Abused: No    Sexually Abused: No    Outpatient Medications Prior to  Visit  Medication Sig Dispense Refill   aspirin  EC 81 MG EC tablet Take 1 tablet (81 mg total) by mouth daily. Swallow whole. 30 tablet 1   atorvastatin  (LIPITOR ) 40 MG tablet Take 1 tablet (40 mg total) by mouth daily. 30 tablet 1   Carboxymethylcellulose Sod PF 0.5 % SOLN Place 1 drop into both eyes in the morning and at bedtime.     diazepam  (VALIUM ) 2 MG tablet Take 1 tablet (2 mg total) by mouth as needed for anxiety (Prior to MRI). Take 30-60 min prior to MRI. Can repeat x1 if needed 2 tablet 0   Docusate Sodium 100 MG capsule Take 200 mg by mouth at bedtime.     esomeprazole  (NEXIUM ) 40 MG capsule Take 1 capsule (40 mg total) by mouth 2 (two) times daily before a meal. 180 capsule 0   losartan (COZAAR) 100 MG tablet Take 100 mg by mouth daily.     Multiple Vitamins-Minerals (MULTIVITAMIN ADULTS 50+) TABS Take 1 tablet by mouth daily.     OVER THE COUNTER MEDICATION Take 2 capsules by mouth daily. Natrol Juice Festiv- fruit supplement     OVER THE COUNTER MEDICATION Take 2 capsules by mouth daily. Natrol vegetable supplement     venlafaxine  XR (EFFEXOR -XR) 150 MG 24 hr capsule Take 1 capsule (150 mg total) by mouth daily with breakfast. 90 capsule 0   zonisamide  (ZONEGRAN ) 50 MG capsule TAKE 2 CAPSULES BY MOUTH DAILY 60 capsule 6   No facility-administered medications prior to visit.    Allergies  Allergen Reactions   Darvon [Propoxyphene] Other (See Comments)     Slept for 3 days   Duloxetine     Added from [facility]- patient is unsure     ROS    See HPI Objective:    Physical Exam Constitutional:      General: She is not in acute distress.    Appearance: Normal appearance. She is well-developed.  HENT:     Head: Normocephalic and atraumatic.     Right Ear: External ear normal.     Left Ear: External ear normal.  Eyes:     General: No scleral icterus. Neck:     Thyroid : No thyromegaly.  Cardiovascular:     Rate and Rhythm: Normal rate and regular rhythm.     Heart sounds: Normal heart sounds. No murmur heard. Pulmonary:     Effort: Pulmonary effort is normal. No respiratory distress.     Breath sounds: Normal breath sounds. No wheezing.  Musculoskeletal:        General: No swelling.     Cervical back: Neck supple.  Skin:    General: Skin is warm and dry.  Neurological:     Mental Status: She is alert and oriented to person, place, and time.  Psychiatric:        Mood and Affect: Mood normal.        Behavior: Behavior normal.        Thought Content: Thought content normal.        Judgment: Judgment normal.      BP 135/73 (BP Location: Right Arm, Patient Position: Sitting, Cuff Size: Normal)   Pulse 92   Temp 98.8 F (37.1 C) (Oral)   Resp 16   Ht 5' 4 (1.626 m)   Wt 171 lb 1.6 oz (77.6 kg)   LMP 08/19/2004   SpO2 96%   BMI 29.37 kg/m  Wt Readings from Last 3 Encounters:  07/07/24 171 lb 1.6  oz (77.6 kg)  05/19/24 167 lb (75.8 kg)  12/31/23 171 lb 12.8 oz (77.9 kg)       Assessment & Plan:   Problem List Items Addressed This Visit       Unprioritized   Hyperglycemia   Lab Results  Component Value Date   HGBA1C 6.2 (H) 01/01/2021   Will update A1C today.       Relevant Orders   HgB A1c   GERD (gastroesophageal reflux disease)   Stable with bid Nexium . Will retrial going down to once daily.       Fatigue - Primary   New. Obtain labs as ordered. Reports good compliance with CPAP.        Relevant Orders   Comp Met (CMET)   CBC w/Diff   TSH   Vitamin D  (25 hydroxy)   Essential hypertension   Stable on losartan. Continue same.       Depression   Mood is stable on effexor .       CVA (cerebral vascular accident) (HCC)   Continues atorvastatin /aspirin  for secondary prevention.       Cough   Improved, but not resolved. Reports that she has seen pulmonologists in the past without any additional recommendations.      Allergic rhinitis   Some allergy  symptoms.  Not currently on antihistamines.        I am having Yolanda Gibbs maintain her Docusate Sodium, Multivitamin Adults 50+, aspirin  EC, atorvastatin , Carboxymethylcellulose Sod PF, losartan, diazepam , OVER THE COUNTER MEDICATION, OVER THE COUNTER MEDICATION, esomeprazole , venlafaxine  XR, and zonisamide .  No orders of the defined types were placed in this encounter.

## 2024-07-07 NOTE — Assessment & Plan Note (Signed)
Stable on losartan. Continue same.

## 2024-07-07 NOTE — Assessment & Plan Note (Signed)
 Mood is stable on effexor .

## 2024-07-07 NOTE — Assessment & Plan Note (Signed)
 Continues atorvastatin /aspirin  for secondary prevention.

## 2024-07-07 NOTE — Assessment & Plan Note (Addendum)
 Stable with bid Nexium . Will retrial going down to once daily.

## 2024-07-07 NOTE — Assessment & Plan Note (Signed)
 New. Obtain labs as ordered. Reports good compliance with CPAP.

## 2024-07-07 NOTE — Assessment & Plan Note (Signed)
 Some allergy  symptoms.  Not currently on antihistamines.

## 2024-07-07 NOTE — Assessment & Plan Note (Signed)
 Improved, but not resolved. Reports that she has seen pulmonologists in the past without any additional recommendations.

## 2024-07-07 NOTE — Patient Instructions (Signed)
  VISIT SUMMARY: Yolanda Gibbs, a 73 year old female, visited today due to worsening fatigue over the past two and a half months. We reviewed her current medications and medical history, including her controlled hypertension, GERD, depression, allergies, chronic cough, history of stroke, and monitoring of blood sugar levels. We discussed her symptoms and ordered several lab tests to investigate the cause of her fatigue.  YOUR PLAN: -FATIGUE: Fatigue is a feeling of extreme tiredness and lack of energy. We have ordered lab tests to check your kidney function, blood count, thyroid  function, and vitamin D  levels to determine the cause of your fatigue.  -ESSENTIAL HYPERTENSION: Essential hypertension is high blood pressure with no identifiable cause. Your blood pressure is well-controlled with losartan, so no changes are needed.  -GASTROESOPHAGEAL REFLUX DISEASE (GERD): GERD is a condition where stomach acid frequently flows back into the tube connecting your mouth and stomach. Your reflux is well-controlled with your current medication. Continue your current regimen, and if you experience heartburn after skipping a dose, return to taking the medication twice daily.  -DEPRESSION: Depression is a mood disorder that causes persistent feelings of sadness and loss of interest. Your mood is well-managed with your current medication, so no changes are needed.  -ALLERGIC RHINITIS: Allergic rhinitis is an allergic reaction that causes sneezing, congestion, and a runny nose. If your allergies worsen, consider using a clear nasal spray once daily.  -CHRONIC COUGH: A chronic cough is a cough that lasts eight weeks or longer. Your persistent cough has not resolved with GERD medication, and previous evaluations have not found a cause.  -HISTORY OF CEREBRAL INFARCTION (STROKE) AND SECONDARY PREVENTION: A cerebral infarction is a type of stroke caused by a blockage in the blood vessels supplying the brain. You are  taking atorvastatin  and aspirin  to prevent another stroke.  -HYPERGLYCEMIA (MONITORING): Hyperglycemia is high blood sugar. We are monitoring your blood sugar levels and have ordered an A1c test to check your current levels.  -GENERAL HEALTH MAINTENANCE: You have received your flu shot this year. We discussed the pneumonia vaccination, and you are not interested at this time. Consider getting the pneumonia vaccination in the future.  INSTRUCTIONS: We have ordered lab tests to check your kidney function, blood count, thyroid  function, vitamin D  levels, and A1c to monitor your blood sugar levels. Please follow up with us  once the results are available.

## 2024-07-08 ENCOUNTER — Telehealth: Payer: Self-pay | Admitting: Family

## 2024-07-08 DIAGNOSIS — R7989 Other specified abnormal findings of blood chemistry: Secondary | ICD-10-CM

## 2024-07-08 LAB — CBC WITH DIFFERENTIAL/PLATELET
Basophils Absolute: 0 K/uL (ref 0.0–0.1)
Basophils Relative: 0.6 % (ref 0.0–3.0)
Eosinophils Absolute: 0.2 K/uL (ref 0.0–0.7)
Eosinophils Relative: 3.6 % (ref 0.0–5.0)
HCT: 42.8 % (ref 36.0–46.0)
Hemoglobin: 14.1 g/dL (ref 12.0–15.0)
Lymphocytes Relative: 30.3 % (ref 12.0–46.0)
Lymphs Abs: 1.8 K/uL (ref 0.7–4.0)
MCHC: 32.9 g/dL (ref 30.0–36.0)
MCV: 91.3 fl (ref 78.0–100.0)
Monocytes Absolute: 0.5 K/uL (ref 0.1–1.0)
Monocytes Relative: 8.1 % (ref 3.0–12.0)
Neutro Abs: 3.5 K/uL (ref 1.4–7.7)
Neutrophils Relative %: 57.4 % (ref 43.0–77.0)
Platelets: 355 K/uL (ref 150.0–400.0)
RBC: 4.68 Mil/uL (ref 3.87–5.11)
RDW: 13.9 % (ref 11.5–15.5)
WBC: 6 K/uL (ref 4.0–10.5)

## 2024-07-08 LAB — HEMOGLOBIN A1C: Hgb A1c MFr Bld: 6.1 % (ref 4.6–6.5)

## 2024-07-08 LAB — COMPREHENSIVE METABOLIC PANEL WITH GFR
ALT: 49 U/L — ABNORMAL HIGH (ref 0–35)
AST: 34 U/L (ref 0–37)
Albumin: 4.6 g/dL (ref 3.5–5.2)
Alkaline Phosphatase: 105 U/L (ref 39–117)
BUN: 17 mg/dL (ref 6–23)
CO2: 24 meq/L (ref 19–32)
Calcium: 9.7 mg/dL (ref 8.4–10.5)
Chloride: 104 meq/L (ref 96–112)
Creatinine, Ser: 0.64 mg/dL (ref 0.40–1.20)
GFR: 88.92 mL/min (ref >60.00)
Glucose, Bld: 107 mg/dL — ABNORMAL HIGH (ref 70–99)
Potassium: 4.4 meq/L (ref 3.5–5.1)
Sodium: 137 meq/L (ref 135–145)
Total Bilirubin: 0.3 mg/dL (ref 0.2–1.2)
Total Protein: 6.9 g/dL (ref 6.0–8.3)

## 2024-07-08 LAB — TSH: TSH: 1.06 u[IU]/mL (ref 0.35–5.50)

## 2024-07-08 LAB — VITAMIN D 25 HYDROXY (VIT D DEFICIENCY, FRACTURES): VITD: 26.21 ng/mL — ABNORMAL LOW (ref 30.00–100.00)

## 2024-07-08 NOTE — Telephone Encounter (Signed)
 Spoke w/ Pt- informed of results and recommendations. Telephone number to imaging given to Pt.

## 2024-07-08 NOTE — Telephone Encounter (Signed)
 One of the liver tests is mildly elevated. I recommend that she do an ultrasound to check for fatty liver. Order pended.

## 2024-07-16 ENCOUNTER — Other Ambulatory Visit: Payer: Self-pay | Admitting: Family

## 2024-07-16 DIAGNOSIS — K219 Gastro-esophageal reflux disease without esophagitis: Secondary | ICD-10-CM

## 2024-07-20 ENCOUNTER — Ambulatory Visit (HOSPITAL_BASED_OUTPATIENT_CLINIC_OR_DEPARTMENT_OTHER)
Admission: RE | Admit: 2024-07-20 | Discharge: 2024-07-20 | Disposition: A | Source: Ambulatory Visit | Attending: Family | Admitting: Family

## 2024-07-20 DIAGNOSIS — R7989 Other specified abnormal findings of blood chemistry: Secondary | ICD-10-CM | POA: Insufficient documentation

## 2024-07-21 ENCOUNTER — Encounter: Payer: Self-pay | Admitting: Family

## 2024-07-21 ENCOUNTER — Ambulatory Visit: Payer: Self-pay | Admitting: Family

## 2024-07-21 DIAGNOSIS — K760 Fatty (change of) liver, not elsewhere classified: Secondary | ICD-10-CM | POA: Insufficient documentation

## 2024-07-26 ENCOUNTER — Ambulatory Visit

## 2024-07-26 DIAGNOSIS — I639 Cerebral infarction, unspecified: Secondary | ICD-10-CM | POA: Diagnosis not present

## 2024-07-27 LAB — CUP PACEART REMOTE DEVICE CHECK
Date Time Interrogation Session: 20251207230914
Implantable Pulse Generator Implant Date: 20220517

## 2024-07-28 ENCOUNTER — Ambulatory Visit: Payer: Self-pay | Admitting: Cardiovascular Disease

## 2024-08-03 NOTE — Progress Notes (Signed)
 Remote Loop Recorder Transmission

## 2024-08-06 ENCOUNTER — Ambulatory Visit: Admitting: Family

## 2024-08-06 VITALS — BP 125/72 | HR 82 | Temp 98.3°F | Resp 16 | Ht 64.0 in | Wt 169.0 lb

## 2024-08-06 DIAGNOSIS — G4733 Obstructive sleep apnea (adult) (pediatric): Secondary | ICD-10-CM

## 2024-08-06 DIAGNOSIS — K760 Fatty (change of) liver, not elsewhere classified: Secondary | ICD-10-CM

## 2024-08-06 DIAGNOSIS — K219 Gastro-esophageal reflux disease without esophagitis: Secondary | ICD-10-CM | POA: Diagnosis not present

## 2024-08-06 DIAGNOSIS — F32A Depression, unspecified: Secondary | ICD-10-CM | POA: Diagnosis not present

## 2024-08-06 DIAGNOSIS — I10 Essential (primary) hypertension: Secondary | ICD-10-CM | POA: Diagnosis not present

## 2024-08-06 DIAGNOSIS — E785 Hyperlipidemia, unspecified: Secondary | ICD-10-CM

## 2024-08-06 DIAGNOSIS — R739 Hyperglycemia, unspecified: Secondary | ICD-10-CM | POA: Diagnosis not present

## 2024-08-06 DIAGNOSIS — R42 Dizziness and giddiness: Secondary | ICD-10-CM | POA: Diagnosis not present

## 2024-08-06 DIAGNOSIS — Z8673 Personal history of transient ischemic attack (TIA), and cerebral infarction without residual deficits: Secondary | ICD-10-CM

## 2024-08-06 DIAGNOSIS — I639 Cerebral infarction, unspecified: Secondary | ICD-10-CM

## 2024-08-06 NOTE — Assessment & Plan Note (Signed)
 BP at goal on losartan 100mg . Continue same.

## 2024-08-06 NOTE — Assessment & Plan Note (Signed)
 Reports stable mood on effexor .  Continue same.

## 2024-08-06 NOTE — Assessment & Plan Note (Signed)
 Lab Results  Component Value Date   HGBA1C 6.1 07/07/2024

## 2024-08-06 NOTE — Assessment & Plan Note (Signed)
 Occasional mild symptoms.

## 2024-08-06 NOTE — Patient Instructions (Signed)
" °  VISIT SUMMARY: Today, you had a routine follow-up visit to review your overall health and manage your ongoing conditions. We discussed your current medications, dietary changes, and various symptoms you have been experiencing.  YOUR PLAN: -ADULT WELLNESS VISIT: This visit was to review your general health and wellness. Your blood pressure is well-controlled, and we discussed the benefits of dietary changes, including the Mediterranean diet, and reducing alcohol consumption for liver health. Continue taking your current medications, including Losartan 100 mg daily, and maintain your dietary changes.  -ESSENTIAL HYPERTENSION: Your high blood pressure is well-managed with your current medication. Continue taking Losartan 100 mg daily.  -HYPERLIPIDEMIA: This condition means you have high cholesterol levels. We have ordered a cholesterol panel to check your current levels. Continue taking Atorvastatin  40 mg daily.  -GASTROESOPHAGEAL REFLUX DISEASE (GERD): GERD is a condition where stomach acid frequently flows back into the tube connecting your mouth and stomach. You are currently taking Esomeprazole  80 mg twice daily. Try reducing the dosage to 40 mg once daily for a few weeks and see if your symptoms remain controlled. If they do, you can consider taking it every other day.  -DEPRESSION: Your mood is stable with your current medication, although you experience some melancholy during the colder months. Continue taking Venlafaxine  XR 150 mg daily.  -FATTY LIVER: Fatty liver is a condition where fat builds up in your liver. Reducing alcohol consumption can help improve liver health. Continue to limit your alcohol intake.  -PREDIABETES: Prediabetes means your blood sugar levels are higher than normal but not high enough to be classified as diabetes. We will repeat your A1c test at your next lab visit. You are also referred to a nutritionist for dietary guidance.  -OBSTRUCTIVE SLEEP APNEA: This condition  causes breathing to repeatedly stop and start during sleep. Continue using your CPAP machine regularly.  -VERTIGO: Vertigo is a sensation of feeling off balance. You have been managing it well with increased water intake and protein consumption. Continue with this management plan.  -ALLERGIC RHINITIS: This condition involves inflammation of the inside of your nose caused by an allergen. You have been experiencing sinus drainage and cough. Continue using Coricidin for relief.  INSTRUCTIONS: Please follow up with the lab for your cholesterol panel and A1c test. Additionally, schedule an appointment with a nutritionist for dietary guidance. You are also due for tetanus and shingles vaccines, so please arrange to get these vaccinations.                                                Contains text generated by [employer].   "

## 2024-08-06 NOTE — Progress Notes (Signed)
 "  Subjective:     Patient ID: Yolanda Gibbs, female    DOB: 28-Feb-1951, 73 y.o.   MRN: [id number]  Chief Complaint  Patient presents with   Fatty liver    Will like to discuss fatty liver diagnosis   Weight Management Screening    Here to discuss weight management    HPI  Discussed the use of AI scribe software for clinical note transcription with the patient, who gave verbal consent to proceed.  History of Present Illness Yolanda Gibbs is a 73 year old female who presents for a routine follow-up visit.  She reports taking losartan 100 mg daily for blood pressure. She is also on Lipitor  40 mg and takes a baby aspirin  daily.  She has made dietary changes and her A1c is in the borderline diabetes range, previously recorded at 6.1, down from 6.2 three years ago. She is considering further dietary adjustments, including the Mediterranean diet.  For her GERD, she is taking esomeprazole  80 mg twice daily but has not yet attempted to reduce the dosage due to being busy. She wants to try reducing the dosage to 40 mg once daily.  Her mood is stable on Effexor , although she experiences some melancholy, particularly during the colder months.  She experiences occasional vertigo, which she manages by increasing water intake and consuming protein. These episodes have not been severe in several months.  She mentions sinus drainage causing a cough, which she attributes to allergies. She has tried allergy  medication without relief and finds some benefit from sinus medication. She is cautious about using decongestants due to their potential to raise blood pressure.  She continues to use a CPAP machine for sleep apnea and has received her flu shot this year. She is due for tetanus and shingles vaccines.      Health Maintenance Due  Topic Date Due   Hepatitis C Screening  Never done   Zoster Vaccines- Shingrix (1 of 2) Never done   DTaP/Tdap/Td (3 - Td or Tdap) 07/23/2021    Past  Medical History:  Diagnosis Date   Allergy     seasonal   Aortic atherosclerosis    mild, noted on CT 2022   CVA (cerebral vascular accident) (HCC)    Depression    Diastolic dysfunction    Fatty liver    GERD (gastroesophageal reflux disease)    History of chicken pox    History of colon polyps    benign   History of surgical closure of patent foramen ovale (PFO)    Hypertension    Migraines    TIA (transient ischemic attack)     Past Surgical History:  Procedure Laterality Date   BUBBLE STUDY  01/02/2021   Procedure: BUBBLE STUDY;  Surgeon: Kate Lonni LITTIE, MD;  Location: [facility] ENDOSCOPY;  Service: Cardiovascular;;   LOOP RECORDER INSERTION N/A 01/02/2021   Procedure: LOOP RECORDER INSERTION;  Surgeon: Francyne Headland, MD;  Location: [facility] INVASIVE CV LAB;  Service: Cardiovascular;  Laterality: N/A;   PATENT FORAMEN OVALE(PFO) CLOSURE N/A 03/09/2021   Procedure: PATENT FORAMEN OVALE (PFO) CLOSURE;  Surgeon: Wonda Sharper, MD;  Location: [facility] INVASIVE CV LAB;  Service: Cardiovascular;  Laterality: N/A;   TEE WITHOUT CARDIOVERSION N/A 01/02/2021   Procedure: TRANSESOPHAGEAL ECHOCARDIOGRAM (TEE);  Surgeon: Kate Lonni LITTIE, MD;  Location: [facility] ENDOSCOPY;  Service: Cardiovascular;  Laterality: N/A;    Family History  Problem Relation Age of Onset   Arthritis Mother    CAD Mother    AAA (  abdominal aortic aneurysm) Mother    Cancer Mother        lung   Stroke Father    Cancer Maternal Aunt        breast   Arthritis Maternal Grandmother    Cancer Maternal Grandfather        prostate   Depression Paternal Grandmother     Social History   Socioeconomic History   Marital status: Married    Spouse name: Not on file   Number of children: Not on file   Years of education: Not on file   Highest education level: Bachelor's degree (e.g., BA, AB, BS)  Occupational History   Not on file  Tobacco Use   Smoking status: Never   Smokeless tobacco: Never  Vaping Use    Vaping status: Never Used  Substance and Sexual Activity   Alcohol use: Not Currently    Alcohol/week: 3.0 - 4.0 standard drinks of alcohol    Types: 3 - 4 Glasses of wine per week    Comment: occasional   Drug use: No   Sexual activity: Not on file  Other Topics Concern   Not on file  Social History Narrative   Daughter at [employer] ([employer]) born 89   Daughter Erin 10   Husband is a curator, has a forensic scientist, has a landscape architect helps with day to day of the business   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   Completed college   Enjoys watching old movies, needlework, cleaning her house   No pets.    Caffiene occasional 12 oz Dr Nunzio,  coffee on weekends.   Social Drivers of Health   Tobacco Use: Low Risk (05/19/2024)   Patient History    Smoking Tobacco Use: Never    Smokeless Tobacco Use: Never    Passive Exposure: Not on file  Financial Resource Strain: Low Risk (07/01/2024)   Overall Financial Resource Strain (CARDIA)    Difficulty of Paying Living Expenses: Not hard at all  Food Insecurity: No Food Insecurity (07/01/2024)   Epic    Worried About Programme Researcher, Broadcasting/film/video in the Last Year: Never true    Ran Out of Food in the Last Year: Never true  Transportation Needs: No Transportation Needs (07/01/2024)   Epic    Lack of Transportation (Medical): No    Lack of Transportation (Non-Medical): No  Physical Activity: Insufficiently Active (07/01/2024)   Exercise Vital Sign    Days of Exercise per Week: 2 days    Minutes of Exercise per Session: 10 min  Stress: No Stress Concern Present (07/01/2024)   [employer] of Occupational Health - Occupational Stress Questionnaire    Feeling of Stress: Not at all  Social Connections: Moderately Isolated (07/01/2024)   Social Connection and Isolation Panel    Frequency of Communication with Friends and Family: More than three times a week    Frequency of Social Gatherings with Friends and  Family: Not on file    Attends Religious Services: Never    Active Member of Clubs or Organizations: No    Attends Banker Meetings: Not on file    Marital Status: Married  Intimate Partner Violence: Not At Risk (05/19/2024)   Epic    Fear of Current or Ex-Partner: No    Emotionally Abused: No    Physically Abused: No    Sexually Abused: No  Depression (PHQ2-9): Low Risk (08/06/2024)   Depression (PHQ2-9)    PHQ-2  Score: 2  Recent Concern: Depression (PHQ2-9) - Medium Risk (07/07/2024)   Depression (PHQ2-9)    PHQ-2 Score: 5  Alcohol Screen: Low Risk (07/01/2024)   Alcohol Screen    Last Alcohol Screening Score (AUDIT): 2  Recent Concern: Alcohol Screen - Medium Risk (05/18/2024)   Alcohol Screen    Last Alcohol Screening Score (AUDIT): 8  Housing: Low Risk (07/01/2024)   Epic    Unable to Pay for Housing in the Last Year: No    Number of Times Moved in the Last Year: 0    Homeless in the Last Year: No  Utilities: Not At Risk (05/19/2024)   Epic    Threatened with loss of utilities: No  Health Literacy: Adequate Health Literacy (05/19/2024)   B1300 Health Literacy    Frequency of need for help with medical instructions: Never    Outpatient Medications Prior to Visit  Medication Sig Dispense Refill   aspirin  EC 81 MG EC tablet Take 1 tablet (81 mg total) by mouth daily. Swallow whole. 30 tablet 1   atorvastatin  (LIPITOR ) 40 MG tablet Take 1 tablet (40 mg total) by mouth daily. 30 tablet 1   Carboxymethylcellulose Sod PF 0.5 % SOLN Place 1 drop into both eyes in the morning and at bedtime.     diazepam  (VALIUM ) 2 MG tablet Take 1 tablet (2 mg total) by mouth as needed for anxiety (Prior to MRI). Take 30-60 min prior to MRI. Can repeat x1 if needed 2 tablet 0   Docusate Sodium 100 MG capsule Take 200 mg by mouth at bedtime.     esomeprazole  (NEXIUM ) 40 MG capsule TAKE 1 CAPSULE BY MOUTH 2 TIMES A DAY BEFORE MEALS 180 capsule 1   losartan (COZAAR) 100 MG tablet Take  100 mg by mouth daily.     Multiple Vitamins-Minerals (MULTIVITAMIN ADULTS 50+) TABS Take 1 tablet by mouth daily.     OVER THE COUNTER MEDICATION Take 2 capsules by mouth daily. Natrol Juice Festiv- fruit supplement     OVER THE COUNTER MEDICATION Take 2 capsules by mouth daily. Natrol vegetable supplement     venlafaxine  XR (EFFEXOR -XR) 150 MG 24 hr capsule Take 1 capsule (150 mg total) by mouth daily with breakfast. 90 capsule 0   zonisamide  (ZONEGRAN ) 50 MG capsule TAKE 2 CAPSULES BY MOUTH DAILY 60 capsule 6   No facility-administered medications prior to visit.    Allergies[1]  ROS See HPI    Objective:    Physical Exam Constitutional:      General: She is not in acute distress.    Appearance: Normal appearance. She is well-developed.  HENT:     Head: Normocephalic and atraumatic.     Right Ear: External ear normal.     Left Ear: External ear normal.  Eyes:     General: No scleral icterus. Neck:     Thyroid : No thyromegaly.  Cardiovascular:     Rate and Rhythm: Normal rate and regular rhythm.     Heart sounds: Normal heart sounds. No murmur heard. Pulmonary:     Effort: Pulmonary effort is normal. No respiratory distress.     Breath sounds: Normal breath sounds. No wheezing.  Musculoskeletal:     Cervical back: Neck supple.  Skin:    General: Skin is warm and dry.  Neurological:     Mental Status: She is alert and oriented to person, place, and time.  Psychiatric:        Mood and Affect: Mood normal.  Behavior: Behavior normal.        Thought Content: Thought content normal.        Judgment: Judgment normal.      BP 125/72 (BP Location: Right Arm, Patient Position: Sitting, Cuff Size: Small)   Pulse 82   Temp 98.3 F (36.8 C) (Oral)   Resp 16   Ht 5' 4 (1.626 m)   Wt 169 lb (76.7 kg)   LMP 08/19/2004   SpO2 96%   BMI 29.01 kg/m  Wt Readings from Last 3 Encounters:  08/06/24 169 lb (76.7 kg)  07/07/24 171 lb 1.6 oz (77.6 kg)  05/19/24 167 lb  (75.8 kg)   Lab Results  Component Value Date   HGBA1C 6.1 07/07/2024   HGBA1C 6.2 (H) 01/01/2021   Lab Results  Component Value Date   LDLCALC 89 01/01/2021   CREATININE 0.64 07/07/2024       Assessment & Plan:   Problem List Items Addressed This Visit       Unprioritized   Vertigo   Occasional mild symptoms.       OSA (obstructive sleep apnea)   Continues cpap.       Hyperlipidemia   Lab Results  Component Value Date   CHOL 190 01/01/2021   HDL 84 01/01/2021   LDLCALC 89 01/01/2021   TRIG 84 01/01/2021   CHOLHDL 2.3 01/01/2021   On statin, update lipid panel.       Hyperglycemia   Lab Results  Component Value Date   HGBA1C 6.1 07/07/2024         Relevant Orders   Amb ref to Medical Nutrition Therapy-MNT   GERD (gastroesophageal reflux disease)   Plans to attempt taper on nexium .       Fatty liver   Relevant Orders   Amb ref to Medical Nutrition Therapy-MNT   Essential hypertension - Primary   BP at goal on losartan 100mg . Continue same.       Depression   Reports stable mood on effexor .  Continue same.       CVA (cerebral vascular accident) (HCC)   Continues statin, aspiri 81mg  for secondary prevention.       I am having Yolanda Gibbs maintain her Docusate Sodium, Multivitamin Adults 50+, aspirin  EC, atorvastatin , Carboxymethylcellulose Sod PF, losartan, diazepam , OVER THE COUNTER MEDICATION, OVER THE COUNTER MEDICATION, venlafaxine  XR, zonisamide , and esomeprazole .  No orders of the defined types were placed in this encounter.     [1]  Allergies Allergen Reactions   Darvon [Propoxyphene] Other (See Comments)    Slept for 3 days   Duloxetine     Added from [facility]- patient is unsure    "

## 2024-08-06 NOTE — Assessment & Plan Note (Signed)
 Lab Results  Component Value Date   CHOL 190 01/01/2021   HDL 84 01/01/2021   LDLCALC 89 01/01/2021   TRIG 84 01/01/2021   CHOLHDL 2.3 01/01/2021   On statin, update lipid panel.

## 2024-08-06 NOTE — Assessment & Plan Note (Addendum)
 Continues statin, aspirin  81mg  for secondary prevention.

## 2024-08-06 NOTE — Assessment & Plan Note (Signed)
Continues cpap.  

## 2024-08-06 NOTE — Assessment & Plan Note (Signed)
 Plans to attempt taper on nexium .

## 2024-08-07 LAB — LIPID PANEL
Cholesterol: 154 mg/dL
HDL: 77 mg/dL
LDL Cholesterol (Calc): 54 mg/dL
Non-HDL Cholesterol (Calc): 77 mg/dL
Total CHOL/HDL Ratio: 2 (calc)
Triglycerides: 143 mg/dL

## 2024-08-09 ENCOUNTER — Ambulatory Visit: Payer: Self-pay | Admitting: Family

## 2024-08-26 ENCOUNTER — Encounter

## 2024-09-08 ENCOUNTER — Ambulatory Visit: Payer: Self-pay

## 2024-09-08 ENCOUNTER — Ambulatory Visit: Admitting: Family

## 2024-09-08 VITALS — BP 139/74 | HR 108 | Temp 99.7°F | Resp 16 | Ht 64.0 in | Wt 166.0 lb

## 2024-09-08 DIAGNOSIS — J029 Acute pharyngitis, unspecified: Secondary | ICD-10-CM | POA: Diagnosis not present

## 2024-09-08 DIAGNOSIS — J069 Acute upper respiratory infection, unspecified: Secondary | ICD-10-CM | POA: Insufficient documentation

## 2024-09-08 DIAGNOSIS — L989 Disorder of the skin and subcutaneous tissue, unspecified: Secondary | ICD-10-CM | POA: Diagnosis not present

## 2024-09-08 LAB — POCT RAPID STREP A (OFFICE): Rapid Strep A Screen: NEGATIVE

## 2024-09-08 NOTE — Progress Notes (Signed)
 "  Subjective:     Patient ID: Yolanda Gibbs, female    DOB: [birth year], 74 y.o.   MRN: [id number]  Chief Complaint  Patient presents with   Sore Throat    Patient reports sore throat for about 5 days   Cough    Patient reports having productive cough   Generalized Body Aches    Patient complains of body aches x 4 days    Sore Throat  Associated symptoms include coughing.  Cough    Discussed the use of AI scribe software for clinical note transcription with the patient, who gave verbal consent to proceed.  History of Present Illness Yolanda Gibbs is a 74 year old female who presents with ear pain, cough, and body aches.  Her symptoms began last Friday and have progressively worsened, with the most significant increase in severity occurring on Sunday night. She describes experiencing ear pain, cough, and body aches.  She has not been tested for COVID-19 or influenza and is unaware of any direct exposures to these illnesses. However, she spent three to four days with her grandchildren, who are now experiencing similar symptoms.      Health Maintenance Due  Topic Date Due   Hepatitis C Screening  Never done   Zoster Vaccines- Shingrix (1 of 2) Never done   DTaP/Tdap/Td (3 - Td or Tdap) 07/23/2021    Past Medical History:  Diagnosis Date   Allergy     seasonal   Aortic atherosclerosis    mild, noted on CT 2022   CVA (cerebral vascular accident) (HCC)    Depression    Diastolic dysfunction    Fatty liver    GERD (gastroesophageal reflux disease)    History of chicken pox    History of colon polyps    benign   History of surgical closure of patent foramen ovale (PFO)    Hypertension    Migraines    TIA (transient ischemic attack)     Past Surgical History:  Procedure Laterality Date   BUBBLE STUDY  01/02/2021   Procedure: BUBBLE STUDY;  Surgeon: Kate Lonni LITTIE, MD;  Location: [facility] ENDOSCOPY;  Service: Cardiovascular;;   LOOP RECORDER INSERTION N/A  01/02/2021   Procedure: LOOP RECORDER INSERTION;  Surgeon: Francyne Headland, MD;  Location: [facility] INVASIVE CV LAB;  Service: Cardiovascular;  Laterality: N/A;   PATENT FORAMEN OVALE(PFO) CLOSURE N/A 03/09/2021   Procedure: PATENT FORAMEN OVALE (PFO) CLOSURE;  Surgeon: Wonda Sharper, MD;  Location: [facility] INVASIVE CV LAB;  Service: Cardiovascular;  Laterality: N/A;   TEE WITHOUT CARDIOVERSION N/A 01/02/2021   Procedure: TRANSESOPHAGEAL ECHOCARDIOGRAM (TEE);  Surgeon: Kate Lonni LITTIE, MD;  Location: [facility] ENDOSCOPY;  Service: Cardiovascular;  Laterality: N/A;    Family History  Problem Relation Age of Onset   Arthritis Mother    CAD Mother    AAA (abdominal aortic aneurysm) Mother    Cancer Mother        lung   Stroke Father    Cancer Maternal Aunt        breast   Arthritis Maternal Grandmother    Cancer Maternal Grandfather        prostate   Depression Paternal Grandmother     Social History   Socioeconomic History   Marital status: Married    Spouse name: Not on file   Number of children: Not on file   Years of education: Not on file   Highest education level: Bachelor's degree (e.g., BA, AB, BS)  Occupational History  Not on file  Tobacco Use   Smoking status: Never   Smokeless tobacco: Never  Vaping Use   Vaping status: Never Used  Substance and Sexual Activity   Alcohol use: Not Currently    Alcohol/week: 3.0 - 4.0 standard drinks of alcohol    Types: 3 - 4 Glasses of wine per week    Comment: occasional   Drug use: No   Sexual activity: Not on file  Other Topics Concern   Not on file  Social History Narrative   Daughter at [employer] ([employer]) born 65   Daughter Erin 61   Husband is a curator, has a forensic scientist, has a landscape architect helps with day to day of the business   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   Completed college   Enjoys watching old movies, needlework, cleaning her house   No pets.    Caffiene occasional 12  oz Dr Nunzio,  coffee on weekends.   Social Drivers of Health   Tobacco Use: Low Risk (05/19/2024)   Patient History    Smoking Tobacco Use: Never    Smokeless Tobacco Use: Never    Passive Exposure: Not on file  Financial Resource Strain: Low Risk (07/01/2024)   Overall Financial Resource Strain (CARDIA)    Difficulty of Paying Living Expenses: Not hard at all  Food Insecurity: No Food Insecurity (07/01/2024)   Epic    Worried About Programme Researcher, Broadcasting/film/video in the Last Year: Never true    Ran Out of Food in the Last Year: Never true  Transportation Needs: No Transportation Needs (07/01/2024)   Epic    Lack of Transportation (Medical): No    Lack of Transportation (Non-Medical): No  Physical Activity: Insufficiently Active (07/01/2024)   Exercise Vital Sign    Days of Exercise per Week: 2 days    Minutes of Exercise per Session: 10 min  Stress: No Stress Concern Present (07/01/2024)   [employer] of Occupational Health - Occupational Stress Questionnaire    Feeling of Stress: Not at all  Social Connections: Moderately Isolated (07/01/2024)   Social Connection and Isolation Panel    Frequency of Communication with Friends and Family: More than three times a week    Frequency of Social Gatherings with Friends and Family: Not on file    Attends Religious Services: Never    Active Member of Clubs or Organizations: No    Attends Banker Meetings: Not on file    Marital Status: Married  Intimate Partner Violence: Not At Risk (05/19/2024)   Epic    Fear of Current or Ex-Partner: No    Emotionally Abused: No    Physically Abused: No    Sexually Abused: No  Depression (PHQ2-9): Low Risk (08/06/2024)   Depression (PHQ2-9)    PHQ-2 Score: 2  Recent Concern: Depression (PHQ2-9) - Medium Risk (07/07/2024)   Depression (PHQ2-9)    PHQ-2 Score: 5  Alcohol Screen: Low Risk (07/01/2024)   Alcohol Screen    Last Alcohol Screening Score (AUDIT): 2  Recent Concern: Alcohol  Screen - Medium Risk (05/18/2024)   Alcohol Screen    Last Alcohol Screening Score (AUDIT): 8  Housing: Low Risk (07/01/2024)   Epic    Unable to Pay for Housing in the Last Year: No    Number of Times Moved in the Last Year: 0    Homeless in the Last Year: No  Utilities: Not At Risk (05/19/2024)   Epic  Threatened with loss of utilities: No  Health Literacy: Adequate Health Literacy (05/19/2024)   B1300 Health Literacy    Frequency of need for help with medical instructions: Never    Outpatient Medications Prior to Visit  Medication Sig Dispense Refill   aspirin  EC 81 MG EC tablet Take 1 tablet (81 mg total) by mouth daily. Swallow whole. 30 tablet 1   atorvastatin  (LIPITOR ) 40 MG tablet Take 1 tablet (40 mg total) by mouth daily. 30 tablet 1   Carboxymethylcellulose Sod PF 0.5 % SOLN Place 1 drop into both eyes in the morning and at bedtime.     diazepam  (VALIUM ) 2 MG tablet Take 1 tablet (2 mg total) by mouth as needed for anxiety (Prior to MRI). Take 30-60 min prior to MRI. Can repeat x1 if needed 2 tablet 0   Docusate Sodium 100 MG capsule Take 200 mg by mouth at bedtime.     esomeprazole  (NEXIUM ) 40 MG capsule TAKE 1 CAPSULE BY MOUTH 2 TIMES A DAY BEFORE MEALS 180 capsule 1   losartan (COZAAR) 100 MG tablet Take 100 mg by mouth daily.     Multiple Vitamins-Minerals (MULTIVITAMIN ADULTS 50+) TABS Take 1 tablet by mouth daily.     OVER THE COUNTER MEDICATION Take 2 capsules by mouth daily. Natrol Juice Festiv- fruit supplement     OVER THE COUNTER MEDICATION Take 2 capsules by mouth daily. Natrol vegetable supplement     venlafaxine  XR (EFFEXOR -XR) 150 MG 24 hr capsule Take 1 capsule (150 mg total) by mouth daily with breakfast. 90 capsule 0   zonisamide  (ZONEGRAN ) 50 MG capsule TAKE 2 CAPSULES BY MOUTH DAILY 60 capsule 6   No facility-administered medications prior to visit.    Allergies[1]  Review of Systems  Respiratory:  Positive for cough.    See HPI    Objective:     Physical Exam Constitutional:      General: She is not in acute distress.    Appearance: Normal appearance. She is well-developed.  HENT:     Head: Normocephalic and atraumatic.     Right Ear: Tympanic membrane, ear canal and external ear normal.     Left Ear: Tympanic membrane, ear canal and external ear normal.     Mouth/Throat:     Mouth: Mucous membranes are moist. No oral lesions.     Pharynx: Posterior oropharyngeal erythema (mild) present. No oropharyngeal exudate.  Eyes:     General: No scleral icterus. Neck:     Thyroid : No thyromegaly.  Cardiovascular:     Rate and Rhythm: Normal rate and regular rhythm.     Heart sounds: Normal heart sounds. No murmur heard. Pulmonary:     Effort: Pulmonary effort is normal. No respiratory distress.     Breath sounds: Normal breath sounds. No wheezing.  Musculoskeletal:     Cervical back: Neck supple.  Lymphadenopathy:     Cervical: No cervical adenopathy.  Skin:    General: Skin is warm and dry.     Comments: Irregularly pigmented skin lesion on anterior chest  Neurological:     Mental Status: She is alert and oriented to person, place, and time.  Psychiatric:        Mood and Affect: Mood normal.        Behavior: Behavior normal.        Thought Content: Thought content normal.        Judgment: Judgment normal.      BP 139/74 (BP Location: Right Arm, Patient  Position: Sitting, Cuff Size: Normal)   Pulse (!) 108   Temp 99.7 F (37.6 C) (Oral)   Resp 16   Ht 5' 4 (1.626 m)   Wt 166 lb (75.3 kg)   LMP 08/19/2004   SpO2 97%   BMI 28.49 kg/m  Wt Readings from Last 3 Encounters:  09/08/24 166 lb (75.3 kg)  08/06/24 169 lb (76.7 kg)  07/07/24 171 lb 1.6 oz (77.6 kg)       Assessment & Plan:   Problem List Items Addressed This Visit       Unprioritized   Viral URI with cough   Outside of treatment with antiviral therapy for flu or covid so did not test. Did perform rapid strep test and it was negative. Recommended  supportive measures, such as tylenol , coriciden, mucinex prn. Follow up if new/worsening symptoms or if symptoms are not improved in 3-4 days.       Skin lesion   Recommended that she contact her dermatologist for further evaluation of this lesion.       Other Visit Diagnoses       Sore throat    -  Primary   Relevant Orders   POCT rapid strep A (Completed)       I am having Yolanda Gibbs maintain her Docusate Sodium, Multivitamin Adults 50+, aspirin  EC, atorvastatin , Carboxymethylcellulose Sod PF, losartan, diazepam , OVER THE COUNTER MEDICATION, OVER THE COUNTER MEDICATION, venlafaxine  XR, zonisamide , and esomeprazole .  No orders of the defined types were placed in this encounter.     [1]  Allergies Allergen Reactions   Darvon [Propoxyphene] Other (See Comments)    Slept for 3 days   Duloxetine     Added from [facility]- patient is unsure    "

## 2024-09-08 NOTE — Telephone Encounter (Signed)
 [name] or Action Required?: [name] for provider: appointment scheduled on 09/08/2024 3:40 PMO'Sullivan, [name], NP [facility].  Patient was last seen in primary care on 08/06/2024 by Yolanda Setter, NP.  Called Nurse Triage reporting Sore Throat and Cough.  Symptoms began several days ago.  Interventions attempted: OTC medications: coricidin.  Symptoms are: gradually worsening.  Triage Disposition: See Physician Within 24 Hours  Patient/caregiver understands and will follow disposition?: Yes          Reason for Disposition  Earache also present  Answer Assessment - Initial Assessment Questions Outbound call to discuss further. Reached patient, patient reports symptoms started sore throat intermittent Friday and Saturday Sunday, Sunday night started feeling and  sore throat worse , aches. Monday it hit past 3 days coughing , stuff no color. Blowing nose clear. Sore throat and still aching not as bad. Throat terrible first woke up after water still sore, right not as bad, throat 5/10. Cough is moderate , less often today. Able to swallow food/liquids. Ear pain , right sided pain right now 3/10 but climbs to 6-7/10 . Coricidin for symptoms hasn't covid /flu tested at home.SABRA Quivers appointment today 09/08/2024 3:40 PM Yolanda Setter, NP [facility]. Advised to please wear a mask   1. ONSET: When did the throat start hurting? (Hours or days ago)      Friday getting worse as of Monday  2. SEVERITY: How bad is the sore throat? (Scale 1-10; mild, moderate or severe)     Moderate  3. STREP EXPOSURE: Has there been any exposure to strep within the past week? If Yes, ask: What type of contact occurred?      Not reported  4.  VIRAL SYMPTOMS: Are there any symptoms of a cold, such as a runny nose, cough, hoarse voice or red eyes?      Cough  5. FEVER: Do you have a fever? If Yes, ask: What is your temperature, how was it measured, and when did it  start?     Denies  6. PUS ON THE TONSILS: Is there pus on the tonsils in the back of your throat?     Didn't assess  7. OTHER SYMPTOMS: Do you have any other symptoms? (e.g., difficulty breathing, headache, rash)     Body aches, sore ear pain, more tired generalized     Patient denies the following chest pain, shortness of breath, chest , vomiting, diarrhea  Protocols used: Sore Throat-A-AH   Message from [place] sent at 09/08/2024 11:46 AM EST  Reason for Triage: pt called because she has cough for 3 days and would like if a nurse could recommend something to take for the cough.

## 2024-09-08 NOTE — Patient Instructions (Addendum)
" °  VISIT SUMMARY: Today, you came in because you have been experiencing ear pain, cough, and body aches since last Friday. Your symptoms have been getting worse, especially since Sunday night. You mentioned that you have not been tested for COVID-19 or influenza and have not had any known exposures, but you did spend time with your grandchildren who are now showing similar symptoms.  YOUR PLAN: -ACUTE UPPER RESPIRATORY INFECTION: An acute upper respiratory infection is a contagious infection of the upper respiratory tract, which includes the nose, throat, and airways. Your symptoms include ear pain, cough, and body aches. We performed a strep test to rule out a streptococcal infection, and this test was negative. Continue coricidin/tylenol  prn. You may use mucinex as needed for congestion.  -PIGMENTED SKIN LESION: A pigmented skin lesion is an area of the skin that is darker than the surrounding skin. You have a brown lesion with a darker brown or black spot. We recommend that you follow up with dermatology for further evaluation of this lesion.  INSTRUCTIONS: Please follow up with dermatology for the evaluation of your pigmented skin lesion. If your symptoms of the upper respiratory infection worsen or do not improve, please return to the clinic for further evaluation.    Contains text generated by [employer].   "

## 2024-09-08 NOTE — Assessment & Plan Note (Signed)
 Outside of treatment with antiviral therapy for flu or covid so did not test. Did perform rapid strep test and it was negative. Recommended supportive measures, such as tylenol , coriciden, mucinex prn. Follow up if new/worsening symptoms or if symptoms are not improved in 3-4 days.

## 2024-09-08 NOTE — Assessment & Plan Note (Signed)
 Recommended that she contact her dermatologist for further evaluation of this lesion.

## 2024-09-21 ENCOUNTER — Other Ambulatory Visit: Payer: Self-pay

## 2024-09-21 DIAGNOSIS — G43E09 Chronic migraine with aura, not intractable, without status migrainosus: Secondary | ICD-10-CM

## 2024-09-21 MED ORDER — VENLAFAXINE HCL ER 150 MG PO CP24: 150.0000 mg | ORAL_CAPSULE | Freq: Every day | ORAL | 0 refills | Status: AC

## 2024-09-23 ENCOUNTER — Telehealth: Payer: Self-pay

## 2024-09-23 MED ORDER — ATORVASTATIN CALCIUM 40 MG PO TABS: 40.0000 mg | ORAL_TABLET | Freq: Every day | ORAL | 1 refills | Status: AC

## 2024-09-23 NOTE — Telephone Encounter (Signed)
 Rx sent.

## 2024-09-23 NOTE — Telephone Encounter (Signed)
 Copied from CRM [id number]. Topic: Clinical - Medication Refill >> Sep 23, 2024  1:43 PM Macario HERO wrote: Medication: atorvastatin  (LIPITOR ) 40 MG tablet [[id number]]  Has the patient contacted their pharmacy? No (Agent: If no, request that the patient contact the pharmacy for the refill. If patient does not wish to contact the pharmacy document the reason why and proceed with request.) (Agent: If yes, when and what did the pharmacy advise?)  This is the patient's preferred pharmacy:   [facility] - [place], [place] - [address] [place] [address] Phone: [phone] Fax: [phone]  Is this the correct pharmacy for this prescription? Yes If no, delete pharmacy and type the correct one.   Has the prescription been filled recently? Yes  Is the patient out of the medication? No  Has the patient been seen for an appointment in the last year OR does the patient have an upcoming appointment? Yes  Can we respond through MyChart? Yes  Agent: Please be advised that Rx refills may take up to 3 business days. We ask that you follow-up with your pharmacy.

## 2024-09-26 ENCOUNTER — Encounter

## 2024-10-27 ENCOUNTER — Ambulatory Visit

## 2024-11-27 ENCOUNTER — Ambulatory Visit

## 2024-12-28 ENCOUNTER — Ambulatory Visit

## 2025-01-04 ENCOUNTER — Ambulatory Visit: Admitting: Family

## 2025-01-07 ENCOUNTER — Ambulatory Visit: Payer: Self-pay | Admitting: Family

## 2025-01-28 ENCOUNTER — Ambulatory Visit

## 2025-02-28 ENCOUNTER — Ambulatory Visit

## 2025-03-31 ENCOUNTER — Ambulatory Visit

## 2025-05-01 ENCOUNTER — Ambulatory Visit

## 2025-05-24 ENCOUNTER — Ambulatory Visit

## 2025-06-01 ENCOUNTER — Ambulatory Visit

## 2025-07-02 ENCOUNTER — Ambulatory Visit

## 2025-08-02 ENCOUNTER — Ambulatory Visit

## 2025-09-02 ENCOUNTER — Ambulatory Visit
